# Patient Record
Sex: Male | Born: 1950
Health system: Southern US, Community
[De-identification: ages and names within clinical notes are randomized; demographics above are authoritative.]

## PROBLEM LIST (undated history)

## (undated) DIAGNOSIS — C760 Malignant neoplasm of head, face and neck: Secondary | ICD-10-CM

## (undated) DIAGNOSIS — D693 Immune thrombocytopenic purpura: Secondary | ICD-10-CM

## (undated) HISTORY — PX: TRACHEOSTOMY: SUR1362

## (undated) MED FILL — Romiplostim For Inj 250 MCG: SUBCUTANEOUS | Qty: 0.41 | Status: AC

## (undated) MED FILL — Romiplostim For Inj 125 MCG: SUBCUTANEOUS | Qty: 0.75 | Status: AC

## (undated) MED FILL — Romiplostim For Inj 500 MCG: SUBCUTANEOUS | Qty: 0.86 | Status: AC

## (undated) MED FILL — Rituximab-pvvr IV Soln 500 MG/50ML (10 MG/ML): INTRAVENOUS | Qty: 80 | Status: AC

## (undated) MED FILL — Acetaminophen Soln 160 MG/5ML: ORAL | Qty: 20.3 | Status: AC

## (undated) MED FILL — Romiplostim For Inj 500 MCG: SUBCUTANEOUS | Qty: 0.83 | Status: AC

---

## 2014-02-02 DIAGNOSIS — Z23 Encounter for immunization: Secondary | ICD-10-CM | POA: Diagnosis not present

## 2014-02-23 DIAGNOSIS — I10 Essential (primary) hypertension: Secondary | ICD-10-CM | POA: Diagnosis not present

## 2014-02-23 DIAGNOSIS — D539 Nutritional anemia, unspecified: Secondary | ICD-10-CM | POA: Diagnosis not present

## 2014-02-23 DIAGNOSIS — G4733 Obstructive sleep apnea (adult) (pediatric): Secondary | ICD-10-CM | POA: Diagnosis not present

## 2014-02-23 DIAGNOSIS — E780 Pure hypercholesterolemia: Secondary | ICD-10-CM | POA: Diagnosis not present

## 2014-02-23 DIAGNOSIS — E871 Hypo-osmolality and hyponatremia: Secondary | ICD-10-CM | POA: Diagnosis not present

## 2014-02-23 DIAGNOSIS — Z125 Encounter for screening for malignant neoplasm of prostate: Secondary | ICD-10-CM | POA: Diagnosis not present

## 2014-03-05 DIAGNOSIS — D696 Thrombocytopenia, unspecified: Secondary | ICD-10-CM | POA: Diagnosis not present

## 2014-03-12 DIAGNOSIS — D696 Thrombocytopenia, unspecified: Secondary | ICD-10-CM | POA: Diagnosis not present

## 2014-03-28 DIAGNOSIS — D696 Thrombocytopenia, unspecified: Secondary | ICD-10-CM | POA: Diagnosis not present

## 2014-03-28 DIAGNOSIS — D649 Anemia, unspecified: Secondary | ICD-10-CM | POA: Diagnosis not present

## 2014-04-27 DIAGNOSIS — R109 Unspecified abdominal pain: Secondary | ICD-10-CM | POA: Diagnosis not present

## 2014-04-27 DIAGNOSIS — K760 Fatty (change of) liver, not elsewhere classified: Secondary | ICD-10-CM | POA: Diagnosis not present

## 2014-04-27 DIAGNOSIS — K7460 Unspecified cirrhosis of liver: Secondary | ICD-10-CM | POA: Diagnosis not present

## 2014-04-27 DIAGNOSIS — D649 Anemia, unspecified: Secondary | ICD-10-CM | POA: Diagnosis not present

## 2014-04-30 DIAGNOSIS — D649 Anemia, unspecified: Secondary | ICD-10-CM | POA: Diagnosis not present

## 2014-04-30 DIAGNOSIS — D696 Thrombocytopenia, unspecified: Secondary | ICD-10-CM | POA: Diagnosis not present

## 2014-04-30 DIAGNOSIS — Z510 Encounter for antineoplastic radiation therapy: Secondary | ICD-10-CM | POA: Diagnosis not present

## 2014-08-30 DIAGNOSIS — D649 Anemia, unspecified: Secondary | ICD-10-CM | POA: Diagnosis not present

## 2014-08-30 DIAGNOSIS — D696 Thrombocytopenia, unspecified: Secondary | ICD-10-CM | POA: Diagnosis not present

## 2014-08-31 ENCOUNTER — Other Ambulatory Visit (HOSPITAL_COMMUNITY)
Admission: RE | Admit: 2014-08-31 | Disposition: A | Discharge status: Home / Self Care | Payer: Self-pay | Source: Ambulatory Visit | Attending: Oncology | Admitting: Oncology

## 2014-08-31 DIAGNOSIS — D469 Myelodysplastic syndrome, unspecified: Secondary | ICD-10-CM | POA: Diagnosis not present

## 2014-08-31 DIAGNOSIS — D696 Thrombocytopenia, unspecified: Secondary | ICD-10-CM | POA: Diagnosis not present

## 2014-08-31 DIAGNOSIS — D61818 Other pancytopenia: Secondary | ICD-10-CM | POA: Diagnosis not present

## 2014-08-31 LAB — BONE MARROW EXAM

## 2014-09-03 DIAGNOSIS — D649 Anemia, unspecified: Secondary | ICD-10-CM | POA: Diagnosis not present

## 2014-09-03 DIAGNOSIS — D696 Thrombocytopenia, unspecified: Secondary | ICD-10-CM | POA: Diagnosis not present

## 2014-09-07 DIAGNOSIS — D696 Thrombocytopenia, unspecified: Secondary | ICD-10-CM | POA: Diagnosis not present

## 2014-09-07 DIAGNOSIS — D72819 Decreased white blood cell count, unspecified: Secondary | ICD-10-CM | POA: Diagnosis not present

## 2014-09-10 DIAGNOSIS — Z5111 Encounter for antineoplastic chemotherapy: Secondary | ICD-10-CM | POA: Diagnosis not present

## 2014-09-10 DIAGNOSIS — D649 Anemia, unspecified: Secondary | ICD-10-CM | POA: Diagnosis not present

## 2014-09-10 DIAGNOSIS — D693 Immune thrombocytopenic purpura: Secondary | ICD-10-CM | POA: Diagnosis not present

## 2014-09-10 LAB — CHROMOSOME ANALYSIS, BONE MARROW

## 2014-09-11 DIAGNOSIS — G4730 Sleep apnea, unspecified: Secondary | ICD-10-CM | POA: Diagnosis not present

## 2014-09-11 DIAGNOSIS — Z5111 Encounter for antineoplastic chemotherapy: Secondary | ICD-10-CM | POA: Diagnosis not present

## 2014-09-11 DIAGNOSIS — D649 Anemia, unspecified: Secondary | ICD-10-CM | POA: Diagnosis not present

## 2014-09-11 DIAGNOSIS — D693 Immune thrombocytopenic purpura: Secondary | ICD-10-CM | POA: Diagnosis not present

## 2014-09-11 DIAGNOSIS — Z6841 Body Mass Index (BMI) 40.0 and over, adult: Secondary | ICD-10-CM | POA: Diagnosis not present

## 2014-09-13 DIAGNOSIS — Z23 Encounter for immunization: Secondary | ICD-10-CM | POA: Diagnosis not present

## 2014-09-13 DIAGNOSIS — D693 Immune thrombocytopenic purpura: Secondary | ICD-10-CM | POA: Diagnosis not present

## 2014-09-20 DIAGNOSIS — D693 Immune thrombocytopenic purpura: Secondary | ICD-10-CM | POA: Diagnosis not present

## 2014-09-25 ENCOUNTER — Encounter (HOSPITAL_COMMUNITY): Payer: Self-pay

## 2014-09-26 DIAGNOSIS — Z79899 Other long term (current) drug therapy: Secondary | ICD-10-CM | POA: Diagnosis not present

## 2014-09-26 DIAGNOSIS — D693 Immune thrombocytopenic purpura: Secondary | ICD-10-CM | POA: Diagnosis not present

## 2014-09-26 DIAGNOSIS — E780 Pure hypercholesterolemia: Secondary | ICD-10-CM | POA: Diagnosis not present

## 2014-09-26 DIAGNOSIS — G4733 Obstructive sleep apnea (adult) (pediatric): Secondary | ICD-10-CM | POA: Diagnosis not present

## 2014-09-26 DIAGNOSIS — Z87891 Personal history of nicotine dependence: Secondary | ICD-10-CM | POA: Diagnosis not present

## 2014-09-26 DIAGNOSIS — Z6841 Body Mass Index (BMI) 40.0 and over, adult: Secondary | ICD-10-CM | POA: Diagnosis not present

## 2014-09-26 DIAGNOSIS — I10 Essential (primary) hypertension: Secondary | ICD-10-CM | POA: Diagnosis not present

## 2014-09-26 DIAGNOSIS — Z9981 Dependence on supplemental oxygen: Secondary | ICD-10-CM | POA: Diagnosis not present

## 2014-09-26 DIAGNOSIS — G4730 Sleep apnea, unspecified: Secondary | ICD-10-CM | POA: Diagnosis not present

## 2014-09-26 DIAGNOSIS — I517 Cardiomegaly: Secondary | ICD-10-CM | POA: Diagnosis not present

## 2014-09-28 ENCOUNTER — Other Ambulatory Visit: Payer: Self-pay

## 2014-09-28 DIAGNOSIS — I10 Essential (primary) hypertension: Secondary | ICD-10-CM | POA: Diagnosis not present

## 2014-09-28 DIAGNOSIS — D693 Immune thrombocytopenic purpura: Secondary | ICD-10-CM | POA: Diagnosis not present

## 2014-09-28 DIAGNOSIS — E780 Pure hypercholesterolemia: Secondary | ICD-10-CM | POA: Diagnosis not present

## 2014-09-28 DIAGNOSIS — D732 Chronic congestive splenomegaly: Secondary | ICD-10-CM | POA: Diagnosis not present

## 2014-09-28 DIAGNOSIS — G4730 Sleep apnea, unspecified: Secondary | ICD-10-CM | POA: Diagnosis not present

## 2014-09-28 DIAGNOSIS — Z6841 Body Mass Index (BMI) 40.0 and over, adult: Secondary | ICD-10-CM | POA: Diagnosis not present

## 2014-09-29 DIAGNOSIS — Z6841 Body Mass Index (BMI) 40.0 and over, adult: Secondary | ICD-10-CM | POA: Diagnosis not present

## 2014-09-29 DIAGNOSIS — D693 Immune thrombocytopenic purpura: Secondary | ICD-10-CM | POA: Diagnosis not present

## 2014-09-29 DIAGNOSIS — I10 Essential (primary) hypertension: Secondary | ICD-10-CM | POA: Diagnosis not present

## 2014-09-29 DIAGNOSIS — E780 Pure hypercholesterolemia: Secondary | ICD-10-CM | POA: Diagnosis not present

## 2014-09-29 DIAGNOSIS — G4730 Sleep apnea, unspecified: Secondary | ICD-10-CM | POA: Diagnosis not present

## 2014-10-12 DIAGNOSIS — D693 Immune thrombocytopenic purpura: Secondary | ICD-10-CM | POA: Diagnosis not present

## 2014-10-12 DIAGNOSIS — D649 Anemia, unspecified: Secondary | ICD-10-CM | POA: Diagnosis not present

## 2014-11-02 DIAGNOSIS — D693 Immune thrombocytopenic purpura: Secondary | ICD-10-CM | POA: Diagnosis not present

## 2014-11-02 DIAGNOSIS — D649 Anemia, unspecified: Secondary | ICD-10-CM | POA: Diagnosis not present

## 2014-11-22 DIAGNOSIS — D696 Thrombocytopenia, unspecified: Secondary | ICD-10-CM | POA: Diagnosis not present

## 2015-01-04 DIAGNOSIS — D693 Immune thrombocytopenic purpura: Secondary | ICD-10-CM | POA: Diagnosis not present

## 2015-01-04 DIAGNOSIS — Z0001 Encounter for general adult medical examination with abnormal findings: Secondary | ICD-10-CM | POA: Diagnosis not present

## 2015-01-04 DIAGNOSIS — D649 Anemia, unspecified: Secondary | ICD-10-CM | POA: Diagnosis not present

## 2015-02-25 DIAGNOSIS — E7800 Pure hypercholesterolemia, unspecified: Secondary | ICD-10-CM | POA: Diagnosis not present

## 2015-02-25 DIAGNOSIS — Z0000 Encounter for general adult medical examination without abnormal findings: Secondary | ICD-10-CM | POA: Diagnosis not present

## 2015-02-25 DIAGNOSIS — Z79899 Other long term (current) drug therapy: Secondary | ICD-10-CM | POA: Diagnosis not present

## 2015-02-25 DIAGNOSIS — D693 Immune thrombocytopenic purpura: Secondary | ICD-10-CM | POA: Diagnosis not present

## 2015-02-25 DIAGNOSIS — Z125 Encounter for screening for malignant neoplasm of prostate: Secondary | ICD-10-CM | POA: Diagnosis not present

## 2015-02-25 DIAGNOSIS — G4733 Obstructive sleep apnea (adult) (pediatric): Secondary | ICD-10-CM | POA: Diagnosis not present

## 2015-02-25 DIAGNOSIS — R7309 Other abnormal glucose: Secondary | ICD-10-CM | POA: Diagnosis not present

## 2015-02-25 DIAGNOSIS — I10 Essential (primary) hypertension: Secondary | ICD-10-CM | POA: Diagnosis not present

## 2015-05-06 DIAGNOSIS — D693 Immune thrombocytopenic purpura: Secondary | ICD-10-CM | POA: Diagnosis not present

## 2015-06-07 DIAGNOSIS — J039 Acute tonsillitis, unspecified: Secondary | ICD-10-CM | POA: Diagnosis not present

## 2015-06-10 ENCOUNTER — Other Ambulatory Visit: Payer: Self-pay

## 2015-06-10 DIAGNOSIS — Z789 Other specified health status: Secondary | ICD-10-CM | POA: Diagnosis not present

## 2015-06-10 DIAGNOSIS — C099 Malignant neoplasm of tonsil, unspecified: Secondary | ICD-10-CM | POA: Diagnosis not present

## 2015-06-10 DIAGNOSIS — J358 Other chronic diseases of tonsils and adenoids: Secondary | ICD-10-CM | POA: Diagnosis not present

## 2015-06-10 DIAGNOSIS — R221 Localized swelling, mass and lump, neck: Secondary | ICD-10-CM | POA: Diagnosis not present

## 2015-06-13 DIAGNOSIS — J358 Other chronic diseases of tonsils and adenoids: Secondary | ICD-10-CM | POA: Diagnosis not present

## 2015-06-13 DIAGNOSIS — R221 Localized swelling, mass and lump, neck: Secondary | ICD-10-CM | POA: Diagnosis not present

## 2015-06-17 DIAGNOSIS — Z79899 Other long term (current) drug therapy: Secondary | ICD-10-CM | POA: Diagnosis not present

## 2015-06-17 DIAGNOSIS — D693 Immune thrombocytopenic purpura: Secondary | ICD-10-CM | POA: Diagnosis not present

## 2015-06-17 DIAGNOSIS — F329 Major depressive disorder, single episode, unspecified: Secondary | ICD-10-CM | POA: Diagnosis not present

## 2015-06-17 DIAGNOSIS — C090 Malignant neoplasm of tonsillar fossa: Secondary | ICD-10-CM | POA: Diagnosis not present

## 2015-06-17 DIAGNOSIS — E7800 Pure hypercholesterolemia, unspecified: Secondary | ICD-10-CM | POA: Diagnosis not present

## 2015-06-17 DIAGNOSIS — Z87891 Personal history of nicotine dependence: Secondary | ICD-10-CM | POA: Diagnosis not present

## 2015-06-17 DIAGNOSIS — I10 Essential (primary) hypertension: Secondary | ICD-10-CM | POA: Diagnosis not present

## 2015-06-17 DIAGNOSIS — B977 Papillomavirus as the cause of diseases classified elsewhere: Secondary | ICD-10-CM | POA: Diagnosis not present

## 2015-06-19 DIAGNOSIS — C090 Malignant neoplasm of tonsillar fossa: Secondary | ICD-10-CM | POA: Diagnosis not present

## 2015-06-19 DIAGNOSIS — D696 Thrombocytopenia, unspecified: Secondary | ICD-10-CM | POA: Diagnosis not present

## 2015-06-19 DIAGNOSIS — D649 Anemia, unspecified: Secondary | ICD-10-CM | POA: Diagnosis not present

## 2015-06-19 DIAGNOSIS — C760 Malignant neoplasm of head, face and neck: Secondary | ICD-10-CM | POA: Diagnosis not present

## 2015-06-20 DIAGNOSIS — C090 Malignant neoplasm of tonsillar fossa: Secondary | ICD-10-CM | POA: Diagnosis not present

## 2015-06-20 DIAGNOSIS — C099 Malignant neoplasm of tonsil, unspecified: Secondary | ICD-10-CM | POA: Diagnosis not present

## 2015-06-24 ENCOUNTER — Other Ambulatory Visit: Payer: Self-pay

## 2015-06-24 DIAGNOSIS — R590 Localized enlarged lymph nodes: Secondary | ICD-10-CM | POA: Diagnosis not present

## 2015-06-24 DIAGNOSIS — C090 Malignant neoplasm of tonsillar fossa: Secondary | ICD-10-CM | POA: Diagnosis not present

## 2015-06-24 DIAGNOSIS — R599 Enlarged lymph nodes, unspecified: Secondary | ICD-10-CM | POA: Diagnosis not present

## 2015-06-28 DIAGNOSIS — C090 Malignant neoplasm of tonsillar fossa: Secondary | ICD-10-CM | POA: Diagnosis not present

## 2015-06-28 DIAGNOSIS — C778 Secondary and unspecified malignant neoplasm of lymph nodes of multiple regions: Secondary | ICD-10-CM | POA: Diagnosis not present

## 2015-06-28 DIAGNOSIS — Z87891 Personal history of nicotine dependence: Secondary | ICD-10-CM | POA: Diagnosis not present

## 2015-07-01 DIAGNOSIS — C099 Malignant neoplasm of tonsil, unspecified: Secondary | ICD-10-CM | POA: Diagnosis not present

## 2015-07-03 DIAGNOSIS — C099 Malignant neoplasm of tonsil, unspecified: Secondary | ICD-10-CM | POA: Diagnosis not present

## 2015-07-03 DIAGNOSIS — Z452 Encounter for adjustment and management of vascular access device: Secondary | ICD-10-CM | POA: Diagnosis not present

## 2015-07-03 DIAGNOSIS — R1310 Dysphagia, unspecified: Secondary | ICD-10-CM | POA: Diagnosis not present

## 2015-07-03 DIAGNOSIS — R633 Feeding difficulties: Secondary | ICD-10-CM | POA: Diagnosis not present

## 2015-07-05 DIAGNOSIS — Z87891 Personal history of nicotine dependence: Secondary | ICD-10-CM | POA: Diagnosis not present

## 2015-07-05 DIAGNOSIS — K9429 Other complications of gastrostomy: Secondary | ICD-10-CM | POA: Diagnosis not present

## 2015-07-05 DIAGNOSIS — I10 Essential (primary) hypertension: Secondary | ICD-10-CM | POA: Diagnosis not present

## 2015-07-05 DIAGNOSIS — C099 Malignant neoplasm of tonsil, unspecified: Secondary | ICD-10-CM | POA: Diagnosis not present

## 2015-07-05 DIAGNOSIS — Z09 Encounter for follow-up examination after completed treatment for conditions other than malignant neoplasm: Secondary | ICD-10-CM | POA: Diagnosis not present

## 2015-07-05 DIAGNOSIS — F329 Major depressive disorder, single episode, unspecified: Secondary | ICD-10-CM | POA: Diagnosis not present

## 2015-07-05 DIAGNOSIS — Z79899 Other long term (current) drug therapy: Secondary | ICD-10-CM | POA: Diagnosis not present

## 2015-07-07 DIAGNOSIS — R6521 Severe sepsis with septic shock: Secondary | ICD-10-CM | POA: Diagnosis not present

## 2015-07-07 DIAGNOSIS — A419 Sepsis, unspecified organism: Secondary | ICD-10-CM | POA: Diagnosis not present

## 2015-07-07 DIAGNOSIS — R1084 Generalized abdominal pain: Secondary | ICD-10-CM | POA: Diagnosis not present

## 2015-07-07 DIAGNOSIS — K9423 Gastrostomy malfunction: Secondary | ICD-10-CM | POA: Diagnosis not present

## 2015-07-07 DIAGNOSIS — R0602 Shortness of breath: Secondary | ICD-10-CM | POA: Diagnosis not present

## 2015-07-07 DIAGNOSIS — M799 Soft tissue disorder, unspecified: Secondary | ICD-10-CM | POA: Diagnosis not present

## 2015-07-08 ENCOUNTER — Other Ambulatory Visit: Payer: Self-pay

## 2015-07-08 DIAGNOSIS — Z79899 Other long term (current) drug therapy: Secondary | ICD-10-CM | POA: Diagnosis not present

## 2015-07-08 DIAGNOSIS — K9429 Other complications of gastrostomy: Secondary | ICD-10-CM | POA: Diagnosis not present

## 2015-07-08 DIAGNOSIS — Z09 Encounter for follow-up examination after completed treatment for conditions other than malignant neoplasm: Secondary | ICD-10-CM | POA: Diagnosis not present

## 2015-07-08 DIAGNOSIS — I10 Essential (primary) hypertension: Secondary | ICD-10-CM | POA: Diagnosis present

## 2015-07-08 DIAGNOSIS — R6521 Severe sepsis with septic shock: Secondary | ICD-10-CM | POA: Diagnosis not present

## 2015-07-08 DIAGNOSIS — F329 Major depressive disorder, single episode, unspecified: Secondary | ICD-10-CM | POA: Diagnosis present

## 2015-07-08 DIAGNOSIS — R9431 Abnormal electrocardiogram [ECG] [EKG]: Secondary | ICD-10-CM | POA: Diagnosis not present

## 2015-07-08 DIAGNOSIS — M799 Soft tissue disorder, unspecified: Secondary | ICD-10-CM | POA: Diagnosis not present

## 2015-07-08 DIAGNOSIS — R109 Unspecified abdominal pain: Secondary | ICD-10-CM | POA: Diagnosis not present

## 2015-07-08 DIAGNOSIS — R0602 Shortness of breath: Secondary | ICD-10-CM | POA: Diagnosis not present

## 2015-07-08 DIAGNOSIS — K2900 Acute gastritis without bleeding: Secondary | ICD-10-CM | POA: Diagnosis not present

## 2015-07-08 DIAGNOSIS — C090 Malignant neoplasm of tonsillar fossa: Secondary | ICD-10-CM | POA: Diagnosis not present

## 2015-07-08 DIAGNOSIS — Z87891 Personal history of nicotine dependence: Secondary | ICD-10-CM | POA: Diagnosis not present

## 2015-07-08 DIAGNOSIS — K9420 Gastrostomy complication, unspecified: Secondary | ICD-10-CM | POA: Diagnosis not present

## 2015-07-08 DIAGNOSIS — A419 Sepsis, unspecified organism: Secondary | ICD-10-CM | POA: Diagnosis not present

## 2015-07-08 DIAGNOSIS — R1084 Generalized abdominal pain: Secondary | ICD-10-CM | POA: Diagnosis not present

## 2015-07-08 DIAGNOSIS — K9423 Gastrostomy malfunction: Secondary | ICD-10-CM | POA: Diagnosis not present

## 2015-07-08 DIAGNOSIS — C099 Malignant neoplasm of tonsil, unspecified: Secondary | ICD-10-CM | POA: Diagnosis present

## 2015-07-10 DIAGNOSIS — C090 Malignant neoplasm of tonsillar fossa: Secondary | ICD-10-CM | POA: Diagnosis not present

## 2015-07-15 DIAGNOSIS — C090 Malignant neoplasm of tonsillar fossa: Secondary | ICD-10-CM | POA: Diagnosis not present

## 2015-07-15 DIAGNOSIS — Z510 Encounter for antineoplastic radiation therapy: Secondary | ICD-10-CM | POA: Diagnosis not present

## 2015-07-16 DIAGNOSIS — Z959 Presence of cardiac and vascular implant and graft, unspecified: Secondary | ICD-10-CM | POA: Diagnosis not present

## 2015-07-16 DIAGNOSIS — D649 Anemia, unspecified: Secondary | ICD-10-CM | POA: Diagnosis not present

## 2015-07-16 DIAGNOSIS — E785 Hyperlipidemia, unspecified: Secondary | ICD-10-CM | POA: Diagnosis not present

## 2015-07-16 DIAGNOSIS — G4730 Sleep apnea, unspecified: Secondary | ICD-10-CM | POA: Diagnosis not present

## 2015-07-16 DIAGNOSIS — C099 Malignant neoplasm of tonsil, unspecified: Secondary | ICD-10-CM | POA: Diagnosis not present

## 2015-07-16 DIAGNOSIS — Z48815 Encounter for surgical aftercare following surgery on the digestive system: Secondary | ICD-10-CM | POA: Diagnosis not present

## 2015-07-16 DIAGNOSIS — F339 Major depressive disorder, recurrent, unspecified: Secondary | ICD-10-CM | POA: Diagnosis not present

## 2015-07-16 DIAGNOSIS — I10 Essential (primary) hypertension: Secondary | ICD-10-CM | POA: Diagnosis not present

## 2015-07-16 DIAGNOSIS — S31109A Unspecified open wound of abdominal wall, unspecified quadrant without penetration into peritoneal cavity, initial encounter: Secondary | ICD-10-CM | POA: Diagnosis not present

## 2015-07-16 DIAGNOSIS — K5730 Diverticulosis of large intestine without perforation or abscess without bleeding: Secondary | ICD-10-CM | POA: Diagnosis not present

## 2015-07-16 DIAGNOSIS — S31101A Unspecified open wound of abdominal wall, left upper quadrant without penetration into peritoneal cavity, initial encounter: Secondary | ICD-10-CM | POA: Diagnosis not present

## 2015-07-16 DIAGNOSIS — T8131XA Disruption of external operation (surgical) wound, not elsewhere classified, initial encounter: Secondary | ICD-10-CM | POA: Diagnosis not present

## 2015-07-16 DIAGNOSIS — K9420 Gastrostomy complication, unspecified: Secondary | ICD-10-CM | POA: Diagnosis not present

## 2015-07-16 DIAGNOSIS — R197 Diarrhea, unspecified: Secondary | ICD-10-CM | POA: Diagnosis not present

## 2015-07-16 DIAGNOSIS — C090 Malignant neoplasm of tonsillar fossa: Secondary | ICD-10-CM | POA: Diagnosis not present

## 2015-07-16 DIAGNOSIS — Z923 Personal history of irradiation: Secondary | ICD-10-CM | POA: Diagnosis not present

## 2015-07-16 DIAGNOSIS — Z931 Gastrostomy status: Secondary | ICD-10-CM | POA: Diagnosis not present

## 2015-07-16 DIAGNOSIS — Z87891 Personal history of nicotine dependence: Secondary | ICD-10-CM | POA: Diagnosis not present

## 2015-07-17 DIAGNOSIS — I10 Essential (primary) hypertension: Secondary | ICD-10-CM | POA: Diagnosis not present

## 2015-07-17 DIAGNOSIS — R197 Diarrhea, unspecified: Secondary | ICD-10-CM | POA: Diagnosis not present

## 2015-07-17 DIAGNOSIS — Z931 Gastrostomy status: Secondary | ICD-10-CM | POA: Diagnosis not present

## 2015-07-17 DIAGNOSIS — Z510 Encounter for antineoplastic radiation therapy: Secondary | ICD-10-CM | POA: Diagnosis not present

## 2015-07-17 DIAGNOSIS — Z959 Presence of cardiac and vascular implant and graft, unspecified: Secondary | ICD-10-CM | POA: Diagnosis not present

## 2015-07-17 DIAGNOSIS — C099 Malignant neoplasm of tonsil, unspecified: Secondary | ICD-10-CM | POA: Diagnosis not present

## 2015-07-17 DIAGNOSIS — C090 Malignant neoplasm of tonsillar fossa: Secondary | ICD-10-CM | POA: Diagnosis not present

## 2015-07-17 DIAGNOSIS — K9420 Gastrostomy complication, unspecified: Secondary | ICD-10-CM | POA: Diagnosis not present

## 2015-07-18 DIAGNOSIS — C090 Malignant neoplasm of tonsillar fossa: Secondary | ICD-10-CM | POA: Diagnosis not present

## 2015-07-18 DIAGNOSIS — Z510 Encounter for antineoplastic radiation therapy: Secondary | ICD-10-CM | POA: Diagnosis not present

## 2015-07-19 DIAGNOSIS — K9420 Gastrostomy complication, unspecified: Secondary | ICD-10-CM | POA: Diagnosis not present

## 2015-07-19 DIAGNOSIS — C090 Malignant neoplasm of tonsillar fossa: Secondary | ICD-10-CM | POA: Diagnosis not present

## 2015-07-19 DIAGNOSIS — I10 Essential (primary) hypertension: Secondary | ICD-10-CM | POA: Diagnosis not present

## 2015-07-19 DIAGNOSIS — C099 Malignant neoplasm of tonsil, unspecified: Secondary | ICD-10-CM | POA: Diagnosis not present

## 2015-07-19 DIAGNOSIS — Z931 Gastrostomy status: Secondary | ICD-10-CM | POA: Diagnosis not present

## 2015-07-19 DIAGNOSIS — Z959 Presence of cardiac and vascular implant and graft, unspecified: Secondary | ICD-10-CM | POA: Diagnosis not present

## 2015-07-19 DIAGNOSIS — R197 Diarrhea, unspecified: Secondary | ICD-10-CM | POA: Diagnosis not present

## 2015-07-19 DIAGNOSIS — Z510 Encounter for antineoplastic radiation therapy: Secondary | ICD-10-CM | POA: Diagnosis not present

## 2015-07-20 DIAGNOSIS — I10 Essential (primary) hypertension: Secondary | ICD-10-CM | POA: Diagnosis not present

## 2015-07-20 DIAGNOSIS — Z959 Presence of cardiac and vascular implant and graft, unspecified: Secondary | ICD-10-CM | POA: Diagnosis not present

## 2015-07-20 DIAGNOSIS — K9420 Gastrostomy complication, unspecified: Secondary | ICD-10-CM | POA: Diagnosis not present

## 2015-07-20 DIAGNOSIS — C099 Malignant neoplasm of tonsil, unspecified: Secondary | ICD-10-CM | POA: Diagnosis not present

## 2015-07-20 DIAGNOSIS — R197 Diarrhea, unspecified: Secondary | ICD-10-CM | POA: Diagnosis not present

## 2015-07-20 DIAGNOSIS — Z931 Gastrostomy status: Secondary | ICD-10-CM | POA: Diagnosis not present

## 2015-07-21 DIAGNOSIS — K9420 Gastrostomy complication, unspecified: Secondary | ICD-10-CM | POA: Diagnosis not present

## 2015-07-21 DIAGNOSIS — Z959 Presence of cardiac and vascular implant and graft, unspecified: Secondary | ICD-10-CM | POA: Diagnosis not present

## 2015-07-21 DIAGNOSIS — Z931 Gastrostomy status: Secondary | ICD-10-CM | POA: Diagnosis not present

## 2015-07-21 DIAGNOSIS — I10 Essential (primary) hypertension: Secondary | ICD-10-CM | POA: Diagnosis not present

## 2015-07-21 DIAGNOSIS — R197 Diarrhea, unspecified: Secondary | ICD-10-CM | POA: Diagnosis not present

## 2015-07-21 DIAGNOSIS — C099 Malignant neoplasm of tonsil, unspecified: Secondary | ICD-10-CM | POA: Diagnosis not present

## 2015-07-22 DIAGNOSIS — Z959 Presence of cardiac and vascular implant and graft, unspecified: Secondary | ICD-10-CM | POA: Diagnosis not present

## 2015-07-22 DIAGNOSIS — C099 Malignant neoplasm of tonsil, unspecified: Secondary | ICD-10-CM | POA: Diagnosis not present

## 2015-07-22 DIAGNOSIS — C090 Malignant neoplasm of tonsillar fossa: Secondary | ICD-10-CM | POA: Diagnosis not present

## 2015-07-22 DIAGNOSIS — I10 Essential (primary) hypertension: Secondary | ICD-10-CM | POA: Diagnosis not present

## 2015-07-22 DIAGNOSIS — Z510 Encounter for antineoplastic radiation therapy: Secondary | ICD-10-CM | POA: Diagnosis not present

## 2015-07-22 DIAGNOSIS — K9420 Gastrostomy complication, unspecified: Secondary | ICD-10-CM | POA: Diagnosis not present

## 2015-07-22 DIAGNOSIS — R197 Diarrhea, unspecified: Secondary | ICD-10-CM | POA: Diagnosis not present

## 2015-07-22 DIAGNOSIS — Z931 Gastrostomy status: Secondary | ICD-10-CM | POA: Diagnosis not present

## 2015-07-23 DIAGNOSIS — Z931 Gastrostomy status: Secondary | ICD-10-CM | POA: Diagnosis not present

## 2015-07-23 DIAGNOSIS — Z510 Encounter for antineoplastic radiation therapy: Secondary | ICD-10-CM | POA: Diagnosis not present

## 2015-07-23 DIAGNOSIS — C090 Malignant neoplasm of tonsillar fossa: Secondary | ICD-10-CM | POA: Diagnosis not present

## 2015-07-23 DIAGNOSIS — Z959 Presence of cardiac and vascular implant and graft, unspecified: Secondary | ICD-10-CM | POA: Diagnosis not present

## 2015-07-23 DIAGNOSIS — R197 Diarrhea, unspecified: Secondary | ICD-10-CM | POA: Diagnosis not present

## 2015-07-23 DIAGNOSIS — C099 Malignant neoplasm of tonsil, unspecified: Secondary | ICD-10-CM | POA: Diagnosis not present

## 2015-07-23 DIAGNOSIS — I10 Essential (primary) hypertension: Secondary | ICD-10-CM | POA: Diagnosis not present

## 2015-07-23 DIAGNOSIS — K9420 Gastrostomy complication, unspecified: Secondary | ICD-10-CM | POA: Diagnosis not present

## 2015-07-24 DIAGNOSIS — C099 Malignant neoplasm of tonsil, unspecified: Secondary | ICD-10-CM | POA: Diagnosis not present

## 2015-07-24 DIAGNOSIS — Z931 Gastrostomy status: Secondary | ICD-10-CM | POA: Diagnosis not present

## 2015-07-24 DIAGNOSIS — K9420 Gastrostomy complication, unspecified: Secondary | ICD-10-CM | POA: Diagnosis not present

## 2015-07-24 DIAGNOSIS — I10 Essential (primary) hypertension: Secondary | ICD-10-CM | POA: Diagnosis not present

## 2015-07-24 DIAGNOSIS — R197 Diarrhea, unspecified: Secondary | ICD-10-CM | POA: Diagnosis not present

## 2015-07-24 DIAGNOSIS — C090 Malignant neoplasm of tonsillar fossa: Secondary | ICD-10-CM | POA: Diagnosis not present

## 2015-07-24 DIAGNOSIS — Z510 Encounter for antineoplastic radiation therapy: Secondary | ICD-10-CM | POA: Diagnosis not present

## 2015-07-24 DIAGNOSIS — Z959 Presence of cardiac and vascular implant and graft, unspecified: Secondary | ICD-10-CM | POA: Diagnosis not present

## 2015-07-25 DIAGNOSIS — C090 Malignant neoplasm of tonsillar fossa: Secondary | ICD-10-CM | POA: Diagnosis not present

## 2015-07-25 DIAGNOSIS — I10 Essential (primary) hypertension: Secondary | ICD-10-CM | POA: Diagnosis not present

## 2015-07-25 DIAGNOSIS — Z931 Gastrostomy status: Secondary | ICD-10-CM | POA: Diagnosis not present

## 2015-07-25 DIAGNOSIS — R197 Diarrhea, unspecified: Secondary | ICD-10-CM | POA: Diagnosis not present

## 2015-07-25 DIAGNOSIS — K9420 Gastrostomy complication, unspecified: Secondary | ICD-10-CM | POA: Diagnosis not present

## 2015-07-25 DIAGNOSIS — Z959 Presence of cardiac and vascular implant and graft, unspecified: Secondary | ICD-10-CM | POA: Diagnosis not present

## 2015-07-25 DIAGNOSIS — C099 Malignant neoplasm of tonsil, unspecified: Secondary | ICD-10-CM | POA: Diagnosis not present

## 2015-07-25 DIAGNOSIS — Z510 Encounter for antineoplastic radiation therapy: Secondary | ICD-10-CM | POA: Diagnosis not present

## 2015-07-26 DIAGNOSIS — Z510 Encounter for antineoplastic radiation therapy: Secondary | ICD-10-CM | POA: Diagnosis not present

## 2015-07-26 DIAGNOSIS — R197 Diarrhea, unspecified: Secondary | ICD-10-CM | POA: Diagnosis not present

## 2015-07-26 DIAGNOSIS — Z959 Presence of cardiac and vascular implant and graft, unspecified: Secondary | ICD-10-CM | POA: Diagnosis not present

## 2015-07-26 DIAGNOSIS — C090 Malignant neoplasm of tonsillar fossa: Secondary | ICD-10-CM | POA: Diagnosis not present

## 2015-07-26 DIAGNOSIS — I10 Essential (primary) hypertension: Secondary | ICD-10-CM | POA: Diagnosis not present

## 2015-07-26 DIAGNOSIS — K9420 Gastrostomy complication, unspecified: Secondary | ICD-10-CM | POA: Diagnosis not present

## 2015-07-26 DIAGNOSIS — C099 Malignant neoplasm of tonsil, unspecified: Secondary | ICD-10-CM | POA: Diagnosis not present

## 2015-07-26 DIAGNOSIS — Z931 Gastrostomy status: Secondary | ICD-10-CM | POA: Diagnosis not present

## 2015-07-29 DIAGNOSIS — Z510 Encounter for antineoplastic radiation therapy: Secondary | ICD-10-CM | POA: Diagnosis not present

## 2015-07-29 DIAGNOSIS — C090 Malignant neoplasm of tonsillar fossa: Secondary | ICD-10-CM | POA: Diagnosis not present

## 2015-07-30 DIAGNOSIS — C090 Malignant neoplasm of tonsillar fossa: Secondary | ICD-10-CM | POA: Diagnosis not present

## 2015-07-30 DIAGNOSIS — Z510 Encounter for antineoplastic radiation therapy: Secondary | ICD-10-CM | POA: Diagnosis not present

## 2015-07-31 DIAGNOSIS — I10 Essential (primary) hypertension: Secondary | ICD-10-CM | POA: Diagnosis not present

## 2015-07-31 DIAGNOSIS — C090 Malignant neoplasm of tonsillar fossa: Secondary | ICD-10-CM | POA: Diagnosis not present

## 2015-07-31 DIAGNOSIS — Z931 Gastrostomy status: Secondary | ICD-10-CM | POA: Diagnosis not present

## 2015-07-31 DIAGNOSIS — K9420 Gastrostomy complication, unspecified: Secondary | ICD-10-CM | POA: Diagnosis not present

## 2015-07-31 DIAGNOSIS — Z510 Encounter for antineoplastic radiation therapy: Secondary | ICD-10-CM | POA: Diagnosis not present

## 2015-07-31 DIAGNOSIS — R197 Diarrhea, unspecified: Secondary | ICD-10-CM | POA: Diagnosis not present

## 2015-07-31 DIAGNOSIS — Z959 Presence of cardiac and vascular implant and graft, unspecified: Secondary | ICD-10-CM | POA: Diagnosis not present

## 2015-07-31 DIAGNOSIS — C099 Malignant neoplasm of tonsil, unspecified: Secondary | ICD-10-CM | POA: Diagnosis not present

## 2015-08-01 DIAGNOSIS — C090 Malignant neoplasm of tonsillar fossa: Secondary | ICD-10-CM | POA: Diagnosis not present

## 2015-08-01 DIAGNOSIS — R1011 Right upper quadrant pain: Secondary | ICD-10-CM | POA: Diagnosis not present

## 2015-08-01 DIAGNOSIS — Z510 Encounter for antineoplastic radiation therapy: Secondary | ICD-10-CM | POA: Diagnosis not present

## 2015-08-02 DIAGNOSIS — C099 Malignant neoplasm of tonsil, unspecified: Secondary | ICD-10-CM | POA: Diagnosis not present

## 2015-08-02 DIAGNOSIS — C090 Malignant neoplasm of tonsillar fossa: Secondary | ICD-10-CM | POA: Diagnosis not present

## 2015-08-02 DIAGNOSIS — I10 Essential (primary) hypertension: Secondary | ICD-10-CM | POA: Diagnosis not present

## 2015-08-02 DIAGNOSIS — K9420 Gastrostomy complication, unspecified: Secondary | ICD-10-CM | POA: Diagnosis not present

## 2015-08-02 DIAGNOSIS — Z931 Gastrostomy status: Secondary | ICD-10-CM | POA: Diagnosis not present

## 2015-08-02 DIAGNOSIS — Z959 Presence of cardiac and vascular implant and graft, unspecified: Secondary | ICD-10-CM | POA: Diagnosis not present

## 2015-08-02 DIAGNOSIS — R197 Diarrhea, unspecified: Secondary | ICD-10-CM | POA: Diagnosis not present

## 2015-08-05 DIAGNOSIS — I82432 Acute embolism and thrombosis of left popliteal vein: Secondary | ICD-10-CM | POA: Diagnosis not present

## 2015-08-05 DIAGNOSIS — Z959 Presence of cardiac and vascular implant and graft, unspecified: Secondary | ICD-10-CM | POA: Diagnosis not present

## 2015-08-05 DIAGNOSIS — C099 Malignant neoplasm of tonsil, unspecified: Secondary | ICD-10-CM | POA: Diagnosis not present

## 2015-08-05 DIAGNOSIS — K9420 Gastrostomy complication, unspecified: Secondary | ICD-10-CM | POA: Diagnosis not present

## 2015-08-05 DIAGNOSIS — Z931 Gastrostomy status: Secondary | ICD-10-CM | POA: Diagnosis not present

## 2015-08-05 DIAGNOSIS — C090 Malignant neoplasm of tonsillar fossa: Secondary | ICD-10-CM | POA: Diagnosis not present

## 2015-08-05 DIAGNOSIS — I10 Essential (primary) hypertension: Secondary | ICD-10-CM | POA: Diagnosis not present

## 2015-08-05 DIAGNOSIS — R197 Diarrhea, unspecified: Secondary | ICD-10-CM | POA: Diagnosis not present

## 2015-08-05 DIAGNOSIS — I82401 Acute embolism and thrombosis of unspecified deep veins of right lower extremity: Secondary | ICD-10-CM | POA: Diagnosis not present

## 2015-08-05 DIAGNOSIS — E860 Dehydration: Secondary | ICD-10-CM | POA: Diagnosis not present

## 2015-08-06 DIAGNOSIS — E860 Dehydration: Secondary | ICD-10-CM | POA: Diagnosis not present

## 2015-08-06 DIAGNOSIS — D72819 Decreased white blood cell count, unspecified: Secondary | ICD-10-CM | POA: Diagnosis not present

## 2015-08-06 DIAGNOSIS — R300 Dysuria: Secondary | ICD-10-CM | POA: Diagnosis not present

## 2015-08-06 DIAGNOSIS — C090 Malignant neoplasm of tonsillar fossa: Secondary | ICD-10-CM | POA: Diagnosis not present

## 2015-08-06 DIAGNOSIS — C07 Malignant neoplasm of parotid gland: Secondary | ICD-10-CM | POA: Diagnosis not present

## 2015-08-06 DIAGNOSIS — E538 Deficiency of other specified B group vitamins: Secondary | ICD-10-CM | POA: Diagnosis not present

## 2015-08-06 DIAGNOSIS — D708 Other neutropenia: Secondary | ICD-10-CM | POA: Diagnosis not present

## 2015-08-06 DIAGNOSIS — D649 Anemia, unspecified: Secondary | ICD-10-CM | POA: Diagnosis not present

## 2015-08-07 DIAGNOSIS — C099 Malignant neoplasm of tonsil, unspecified: Secondary | ICD-10-CM | POA: Diagnosis not present

## 2015-08-07 DIAGNOSIS — I82401 Acute embolism and thrombosis of unspecified deep veins of right lower extremity: Secondary | ICD-10-CM | POA: Diagnosis not present

## 2015-08-07 DIAGNOSIS — C090 Malignant neoplasm of tonsillar fossa: Secondary | ICD-10-CM | POA: Diagnosis not present

## 2015-08-07 DIAGNOSIS — R197 Diarrhea, unspecified: Secondary | ICD-10-CM | POA: Diagnosis not present

## 2015-08-07 DIAGNOSIS — Z7901 Long term (current) use of anticoagulants: Secondary | ICD-10-CM | POA: Diagnosis not present

## 2015-08-07 DIAGNOSIS — Z959 Presence of cardiac and vascular implant and graft, unspecified: Secondary | ICD-10-CM | POA: Diagnosis not present

## 2015-08-07 DIAGNOSIS — D709 Neutropenia, unspecified: Secondary | ICD-10-CM | POA: Diagnosis not present

## 2015-08-07 DIAGNOSIS — K9420 Gastrostomy complication, unspecified: Secondary | ICD-10-CM | POA: Diagnosis not present

## 2015-08-07 DIAGNOSIS — D72819 Decreased white blood cell count, unspecified: Secondary | ICD-10-CM | POA: Diagnosis not present

## 2015-08-07 DIAGNOSIS — I10 Essential (primary) hypertension: Secondary | ICD-10-CM | POA: Diagnosis not present

## 2015-08-07 DIAGNOSIS — Z931 Gastrostomy status: Secondary | ICD-10-CM | POA: Diagnosis not present

## 2015-08-08 DIAGNOSIS — C090 Malignant neoplasm of tonsillar fossa: Secondary | ICD-10-CM | POA: Diagnosis not present

## 2015-08-08 DIAGNOSIS — D72819 Decreased white blood cell count, unspecified: Secondary | ICD-10-CM | POA: Diagnosis not present

## 2015-08-08 DIAGNOSIS — D709 Neutropenia, unspecified: Secondary | ICD-10-CM | POA: Diagnosis not present

## 2015-08-08 DIAGNOSIS — I82401 Acute embolism and thrombosis of unspecified deep veins of right lower extremity: Secondary | ICD-10-CM | POA: Diagnosis not present

## 2015-08-08 DIAGNOSIS — E860 Dehydration: Secondary | ICD-10-CM | POA: Diagnosis not present

## 2015-08-08 DIAGNOSIS — Z7901 Long term (current) use of anticoagulants: Secondary | ICD-10-CM | POA: Diagnosis not present

## 2015-08-09 DIAGNOSIS — E860 Dehydration: Secondary | ICD-10-CM | POA: Diagnosis not present

## 2015-08-09 DIAGNOSIS — Z959 Presence of cardiac and vascular implant and graft, unspecified: Secondary | ICD-10-CM | POA: Diagnosis not present

## 2015-08-09 DIAGNOSIS — I82401 Acute embolism and thrombosis of unspecified deep veins of right lower extremity: Secondary | ICD-10-CM | POA: Diagnosis not present

## 2015-08-09 DIAGNOSIS — C099 Malignant neoplasm of tonsil, unspecified: Secondary | ICD-10-CM | POA: Diagnosis not present

## 2015-08-09 DIAGNOSIS — R197 Diarrhea, unspecified: Secondary | ICD-10-CM | POA: Diagnosis not present

## 2015-08-09 DIAGNOSIS — D709 Neutropenia, unspecified: Secondary | ICD-10-CM | POA: Diagnosis not present

## 2015-08-09 DIAGNOSIS — Z931 Gastrostomy status: Secondary | ICD-10-CM | POA: Diagnosis not present

## 2015-08-09 DIAGNOSIS — K9420 Gastrostomy complication, unspecified: Secondary | ICD-10-CM | POA: Diagnosis not present

## 2015-08-09 DIAGNOSIS — C090 Malignant neoplasm of tonsillar fossa: Secondary | ICD-10-CM | POA: Diagnosis not present

## 2015-08-09 DIAGNOSIS — I10 Essential (primary) hypertension: Secondary | ICD-10-CM | POA: Diagnosis not present

## 2015-08-09 DIAGNOSIS — Z7901 Long term (current) use of anticoagulants: Secondary | ICD-10-CM | POA: Diagnosis not present

## 2015-08-11 DIAGNOSIS — Z8619 Personal history of other infectious and parasitic diseases: Secondary | ICD-10-CM | POA: Diagnosis not present

## 2015-08-11 DIAGNOSIS — C099 Malignant neoplasm of tonsil, unspecified: Secondary | ICD-10-CM | POA: Diagnosis not present

## 2015-08-11 DIAGNOSIS — R531 Weakness: Secondary | ICD-10-CM | POA: Diagnosis not present

## 2015-08-11 DIAGNOSIS — R509 Fever, unspecified: Secondary | ICD-10-CM | POA: Diagnosis not present

## 2015-08-11 DIAGNOSIS — D709 Neutropenia, unspecified: Secondary | ICD-10-CM | POA: Diagnosis not present

## 2015-08-12 DIAGNOSIS — K9420 Gastrostomy complication, unspecified: Secondary | ICD-10-CM | POA: Diagnosis not present

## 2015-08-12 DIAGNOSIS — I10 Essential (primary) hypertension: Secondary | ICD-10-CM | POA: Diagnosis not present

## 2015-08-12 DIAGNOSIS — Z959 Presence of cardiac and vascular implant and graft, unspecified: Secondary | ICD-10-CM | POA: Diagnosis not present

## 2015-08-12 DIAGNOSIS — Z931 Gastrostomy status: Secondary | ICD-10-CM | POA: Diagnosis not present

## 2015-08-12 DIAGNOSIS — R197 Diarrhea, unspecified: Secondary | ICD-10-CM | POA: Diagnosis not present

## 2015-08-12 DIAGNOSIS — I951 Orthostatic hypotension: Secondary | ICD-10-CM | POA: Diagnosis not present

## 2015-08-12 DIAGNOSIS — C099 Malignant neoplasm of tonsil, unspecified: Secondary | ICD-10-CM | POA: Diagnosis not present

## 2015-08-12 DIAGNOSIS — C090 Malignant neoplasm of tonsillar fossa: Secondary | ICD-10-CM | POA: Diagnosis not present

## 2015-08-13 DIAGNOSIS — R509 Fever, unspecified: Secondary | ICD-10-CM | POA: Diagnosis not present

## 2015-08-13 DIAGNOSIS — C090 Malignant neoplasm of tonsillar fossa: Secondary | ICD-10-CM | POA: Diagnosis not present

## 2015-08-14 DIAGNOSIS — C090 Malignant neoplasm of tonsillar fossa: Secondary | ICD-10-CM | POA: Diagnosis not present

## 2015-08-14 DIAGNOSIS — I10 Essential (primary) hypertension: Secondary | ICD-10-CM | POA: Diagnosis not present

## 2015-08-14 DIAGNOSIS — Z931 Gastrostomy status: Secondary | ICD-10-CM | POA: Diagnosis not present

## 2015-08-14 DIAGNOSIS — R197 Diarrhea, unspecified: Secondary | ICD-10-CM | POA: Diagnosis not present

## 2015-08-14 DIAGNOSIS — Z959 Presence of cardiac and vascular implant and graft, unspecified: Secondary | ICD-10-CM | POA: Diagnosis not present

## 2015-08-14 DIAGNOSIS — C099 Malignant neoplasm of tonsil, unspecified: Secondary | ICD-10-CM | POA: Diagnosis not present

## 2015-08-14 DIAGNOSIS — K9420 Gastrostomy complication, unspecified: Secondary | ICD-10-CM | POA: Diagnosis not present

## 2015-08-15 DIAGNOSIS — R918 Other nonspecific abnormal finding of lung field: Secondary | ICD-10-CM | POA: Diagnosis not present

## 2015-08-15 DIAGNOSIS — C090 Malignant neoplasm of tonsillar fossa: Secondary | ICD-10-CM | POA: Diagnosis not present

## 2015-08-16 DIAGNOSIS — C099 Malignant neoplasm of tonsil, unspecified: Secondary | ICD-10-CM | POA: Diagnosis not present

## 2015-08-16 DIAGNOSIS — Z931 Gastrostomy status: Secondary | ICD-10-CM | POA: Diagnosis not present

## 2015-08-16 DIAGNOSIS — K9420 Gastrostomy complication, unspecified: Secondary | ICD-10-CM | POA: Diagnosis not present

## 2015-08-16 DIAGNOSIS — Z959 Presence of cardiac and vascular implant and graft, unspecified: Secondary | ICD-10-CM | POA: Diagnosis not present

## 2015-08-16 DIAGNOSIS — R197 Diarrhea, unspecified: Secondary | ICD-10-CM | POA: Diagnosis not present

## 2015-08-16 DIAGNOSIS — I10 Essential (primary) hypertension: Secondary | ICD-10-CM | POA: Diagnosis not present

## 2015-08-16 DIAGNOSIS — C090 Malignant neoplasm of tonsillar fossa: Secondary | ICD-10-CM | POA: Diagnosis not present

## 2015-08-19 DIAGNOSIS — Z931 Gastrostomy status: Secondary | ICD-10-CM | POA: Diagnosis not present

## 2015-08-19 DIAGNOSIS — R000 Tachycardia, unspecified: Secondary | ICD-10-CM | POA: Diagnosis not present

## 2015-08-19 DIAGNOSIS — C090 Malignant neoplasm of tonsillar fossa: Secondary | ICD-10-CM | POA: Diagnosis not present

## 2015-08-19 DIAGNOSIS — R197 Diarrhea, unspecified: Secondary | ICD-10-CM | POA: Diagnosis not present

## 2015-08-19 DIAGNOSIS — I10 Essential (primary) hypertension: Secondary | ICD-10-CM | POA: Diagnosis not present

## 2015-08-19 DIAGNOSIS — C099 Malignant neoplasm of tonsil, unspecified: Secondary | ICD-10-CM | POA: Diagnosis not present

## 2015-08-19 DIAGNOSIS — Z959 Presence of cardiac and vascular implant and graft, unspecified: Secondary | ICD-10-CM | POA: Diagnosis not present

## 2015-08-19 DIAGNOSIS — K9420 Gastrostomy complication, unspecified: Secondary | ICD-10-CM | POA: Diagnosis not present

## 2015-08-20 DIAGNOSIS — C090 Malignant neoplasm of tonsillar fossa: Secondary | ICD-10-CM | POA: Diagnosis not present

## 2015-08-21 DIAGNOSIS — I340 Nonrheumatic mitral (valve) insufficiency: Secondary | ICD-10-CM | POA: Diagnosis not present

## 2015-08-21 DIAGNOSIS — I361 Nonrheumatic tricuspid (valve) insufficiency: Secondary | ICD-10-CM | POA: Diagnosis not present

## 2015-08-21 DIAGNOSIS — K9420 Gastrostomy complication, unspecified: Secondary | ICD-10-CM | POA: Diagnosis not present

## 2015-08-21 DIAGNOSIS — I517 Cardiomegaly: Secondary | ICD-10-CM | POA: Diagnosis not present

## 2015-08-21 DIAGNOSIS — Z959 Presence of cardiac and vascular implant and graft, unspecified: Secondary | ICD-10-CM | POA: Diagnosis not present

## 2015-08-21 DIAGNOSIS — R197 Diarrhea, unspecified: Secondary | ICD-10-CM | POA: Diagnosis not present

## 2015-08-21 DIAGNOSIS — C090 Malignant neoplasm of tonsillar fossa: Secondary | ICD-10-CM | POA: Diagnosis not present

## 2015-08-21 DIAGNOSIS — C099 Malignant neoplasm of tonsil, unspecified: Secondary | ICD-10-CM | POA: Diagnosis not present

## 2015-08-21 DIAGNOSIS — I10 Essential (primary) hypertension: Secondary | ICD-10-CM | POA: Diagnosis not present

## 2015-08-21 DIAGNOSIS — Z931 Gastrostomy status: Secondary | ICD-10-CM | POA: Diagnosis not present

## 2015-08-21 DIAGNOSIS — D708 Other neutropenia: Secondary | ICD-10-CM | POA: Diagnosis not present

## 2015-08-22 DIAGNOSIS — C090 Malignant neoplasm of tonsillar fossa: Secondary | ICD-10-CM | POA: Diagnosis not present

## 2015-08-23 DIAGNOSIS — C099 Malignant neoplasm of tonsil, unspecified: Secondary | ICD-10-CM | POA: Diagnosis not present

## 2015-08-23 DIAGNOSIS — Z931 Gastrostomy status: Secondary | ICD-10-CM | POA: Diagnosis not present

## 2015-08-23 DIAGNOSIS — I10 Essential (primary) hypertension: Secondary | ICD-10-CM | POA: Diagnosis not present

## 2015-08-23 DIAGNOSIS — C090 Malignant neoplasm of tonsillar fossa: Secondary | ICD-10-CM | POA: Diagnosis not present

## 2015-08-23 DIAGNOSIS — Z959 Presence of cardiac and vascular implant and graft, unspecified: Secondary | ICD-10-CM | POA: Diagnosis not present

## 2015-08-23 DIAGNOSIS — R197 Diarrhea, unspecified: Secondary | ICD-10-CM | POA: Diagnosis not present

## 2015-08-23 DIAGNOSIS — K9420 Gastrostomy complication, unspecified: Secondary | ICD-10-CM | POA: Diagnosis not present

## 2015-08-26 DIAGNOSIS — R197 Diarrhea, unspecified: Secondary | ICD-10-CM | POA: Diagnosis not present

## 2015-08-26 DIAGNOSIS — K9420 Gastrostomy complication, unspecified: Secondary | ICD-10-CM | POA: Diagnosis not present

## 2015-08-26 DIAGNOSIS — I10 Essential (primary) hypertension: Secondary | ICD-10-CM | POA: Diagnosis not present

## 2015-08-26 DIAGNOSIS — C090 Malignant neoplasm of tonsillar fossa: Secondary | ICD-10-CM | POA: Diagnosis not present

## 2015-08-26 DIAGNOSIS — C099 Malignant neoplasm of tonsil, unspecified: Secondary | ICD-10-CM | POA: Diagnosis not present

## 2015-08-26 DIAGNOSIS — Z959 Presence of cardiac and vascular implant and graft, unspecified: Secondary | ICD-10-CM | POA: Diagnosis not present

## 2015-08-26 DIAGNOSIS — Z931 Gastrostomy status: Secondary | ICD-10-CM | POA: Diagnosis not present

## 2015-08-27 DIAGNOSIS — Z87891 Personal history of nicotine dependence: Secondary | ICD-10-CM | POA: Diagnosis not present

## 2015-08-27 DIAGNOSIS — C090 Malignant neoplasm of tonsillar fossa: Secondary | ICD-10-CM | POA: Diagnosis not present

## 2015-08-27 DIAGNOSIS — K5790 Diverticulosis of intestine, part unspecified, without perforation or abscess without bleeding: Secondary | ICD-10-CM | POA: Diagnosis not present

## 2015-08-27 DIAGNOSIS — Z4800 Encounter for change or removal of nonsurgical wound dressing: Secondary | ICD-10-CM | POA: Diagnosis not present

## 2015-08-27 DIAGNOSIS — Y842 Radiological procedure and radiotherapy as the cause of abnormal reaction of the patient, or of later complication, without mention of misadventure at the time of the procedure: Secondary | ICD-10-CM | POA: Diagnosis not present

## 2015-08-27 DIAGNOSIS — I82401 Acute embolism and thrombosis of unspecified deep veins of right lower extremity: Secondary | ICD-10-CM | POA: Diagnosis not present

## 2015-08-27 DIAGNOSIS — T2007XD Burn of unspecified degree of neck, subsequent encounter: Secondary | ICD-10-CM | POA: Diagnosis not present

## 2015-08-27 DIAGNOSIS — Z452 Encounter for adjustment and management of vascular access device: Secondary | ICD-10-CM | POA: Diagnosis not present

## 2015-08-27 DIAGNOSIS — F329 Major depressive disorder, single episode, unspecified: Secondary | ICD-10-CM | POA: Diagnosis not present

## 2015-08-27 DIAGNOSIS — K9422 Gastrostomy infection: Secondary | ICD-10-CM | POA: Diagnosis not present

## 2015-08-27 DIAGNOSIS — R1310 Dysphagia, unspecified: Secondary | ICD-10-CM | POA: Diagnosis not present

## 2015-08-27 DIAGNOSIS — I10 Essential (primary) hypertension: Secondary | ICD-10-CM | POA: Diagnosis not present

## 2015-08-27 DIAGNOSIS — Z79899 Other long term (current) drug therapy: Secondary | ICD-10-CM | POA: Diagnosis not present

## 2015-08-28 DIAGNOSIS — R1310 Dysphagia, unspecified: Secondary | ICD-10-CM | POA: Diagnosis not present

## 2015-08-28 DIAGNOSIS — I82401 Acute embolism and thrombosis of unspecified deep veins of right lower extremity: Secondary | ICD-10-CM | POA: Diagnosis not present

## 2015-08-28 DIAGNOSIS — K9422 Gastrostomy infection: Secondary | ICD-10-CM | POA: Diagnosis not present

## 2015-08-28 DIAGNOSIS — C090 Malignant neoplasm of tonsillar fossa: Secondary | ICD-10-CM | POA: Diagnosis not present

## 2015-08-28 DIAGNOSIS — T2007XD Burn of unspecified degree of neck, subsequent encounter: Secondary | ICD-10-CM | POA: Diagnosis not present

## 2015-08-28 DIAGNOSIS — Z452 Encounter for adjustment and management of vascular access device: Secondary | ICD-10-CM | POA: Diagnosis not present

## 2015-08-29 DIAGNOSIS — K9422 Gastrostomy infection: Secondary | ICD-10-CM | POA: Diagnosis not present

## 2015-08-29 DIAGNOSIS — C090 Malignant neoplasm of tonsillar fossa: Secondary | ICD-10-CM | POA: Diagnosis not present

## 2015-08-29 DIAGNOSIS — T2009XD Burn of unspecified degree of multiple sites of head, face, and neck, subsequent encounter: Secondary | ICD-10-CM | POA: Diagnosis not present

## 2015-08-29 DIAGNOSIS — I10 Essential (primary) hypertension: Secondary | ICD-10-CM | POA: Diagnosis not present

## 2015-08-29 DIAGNOSIS — T2007XD Burn of unspecified degree of neck, subsequent encounter: Secondary | ICD-10-CM | POA: Diagnosis not present

## 2015-08-29 DIAGNOSIS — K5790 Diverticulosis of intestine, part unspecified, without perforation or abscess without bleeding: Secondary | ICD-10-CM | POA: Diagnosis not present

## 2015-08-29 DIAGNOSIS — Z452 Encounter for adjustment and management of vascular access device: Secondary | ICD-10-CM | POA: Diagnosis not present

## 2015-08-29 DIAGNOSIS — R1310 Dysphagia, unspecified: Secondary | ICD-10-CM | POA: Diagnosis not present

## 2015-08-29 DIAGNOSIS — I82401 Acute embolism and thrombosis of unspecified deep veins of right lower extremity: Secondary | ICD-10-CM | POA: Diagnosis not present

## 2015-08-30 DIAGNOSIS — C090 Malignant neoplasm of tonsillar fossa: Secondary | ICD-10-CM | POA: Diagnosis not present

## 2015-08-30 DIAGNOSIS — T2007XD Burn of unspecified degree of neck, subsequent encounter: Secondary | ICD-10-CM | POA: Diagnosis not present

## 2015-08-30 DIAGNOSIS — R1310 Dysphagia, unspecified: Secondary | ICD-10-CM | POA: Diagnosis not present

## 2015-08-30 DIAGNOSIS — Z452 Encounter for adjustment and management of vascular access device: Secondary | ICD-10-CM | POA: Diagnosis not present

## 2015-08-30 DIAGNOSIS — I82401 Acute embolism and thrombosis of unspecified deep veins of right lower extremity: Secondary | ICD-10-CM | POA: Diagnosis not present

## 2015-08-30 DIAGNOSIS — K9422 Gastrostomy infection: Secondary | ICD-10-CM | POA: Diagnosis not present

## 2015-09-02 DIAGNOSIS — C090 Malignant neoplasm of tonsillar fossa: Secondary | ICD-10-CM | POA: Diagnosis not present

## 2015-09-02 DIAGNOSIS — R05 Cough: Secondary | ICD-10-CM | POA: Diagnosis not present

## 2015-09-02 DIAGNOSIS — R1310 Dysphagia, unspecified: Secondary | ICD-10-CM | POA: Diagnosis not present

## 2015-09-02 DIAGNOSIS — I82401 Acute embolism and thrombosis of unspecified deep veins of right lower extremity: Secondary | ICD-10-CM | POA: Diagnosis not present

## 2015-09-02 DIAGNOSIS — C099 Malignant neoplasm of tonsil, unspecified: Secondary | ICD-10-CM | POA: Diagnosis not present

## 2015-09-02 DIAGNOSIS — K9422 Gastrostomy infection: Secondary | ICD-10-CM | POA: Diagnosis not present

## 2015-09-02 DIAGNOSIS — D649 Anemia, unspecified: Secondary | ICD-10-CM | POA: Diagnosis not present

## 2015-09-02 DIAGNOSIS — I10 Essential (primary) hypertension: Secondary | ICD-10-CM | POA: Diagnosis not present

## 2015-09-02 DIAGNOSIS — K5790 Diverticulosis of intestine, part unspecified, without perforation or abscess without bleeding: Secondary | ICD-10-CM | POA: Diagnosis not present

## 2015-09-02 DIAGNOSIS — T2009XD Burn of unspecified degree of multiple sites of head, face, and neck, subsequent encounter: Secondary | ICD-10-CM | POA: Diagnosis not present

## 2015-09-02 DIAGNOSIS — Z452 Encounter for adjustment and management of vascular access device: Secondary | ICD-10-CM | POA: Diagnosis not present

## 2015-09-02 DIAGNOSIS — T2007XD Burn of unspecified degree of neck, subsequent encounter: Secondary | ICD-10-CM | POA: Diagnosis not present

## 2015-09-04 DIAGNOSIS — I82401 Acute embolism and thrombosis of unspecified deep veins of right lower extremity: Secondary | ICD-10-CM | POA: Diagnosis not present

## 2015-09-04 DIAGNOSIS — R1310 Dysphagia, unspecified: Secondary | ICD-10-CM | POA: Diagnosis not present

## 2015-09-04 DIAGNOSIS — C090 Malignant neoplasm of tonsillar fossa: Secondary | ICD-10-CM | POA: Diagnosis not present

## 2015-09-04 DIAGNOSIS — K9422 Gastrostomy infection: Secondary | ICD-10-CM | POA: Diagnosis not present

## 2015-09-04 DIAGNOSIS — T2007XD Burn of unspecified degree of neck, subsequent encounter: Secondary | ICD-10-CM | POA: Diagnosis not present

## 2015-09-04 DIAGNOSIS — Z452 Encounter for adjustment and management of vascular access device: Secondary | ICD-10-CM | POA: Diagnosis not present

## 2015-09-06 DIAGNOSIS — I82401 Acute embolism and thrombosis of unspecified deep veins of right lower extremity: Secondary | ICD-10-CM | POA: Diagnosis not present

## 2015-09-06 DIAGNOSIS — Z452 Encounter for adjustment and management of vascular access device: Secondary | ICD-10-CM | POA: Diagnosis not present

## 2015-09-06 DIAGNOSIS — K9422 Gastrostomy infection: Secondary | ICD-10-CM | POA: Diagnosis not present

## 2015-09-06 DIAGNOSIS — C090 Malignant neoplasm of tonsillar fossa: Secondary | ICD-10-CM | POA: Diagnosis not present

## 2015-09-06 DIAGNOSIS — R1310 Dysphagia, unspecified: Secondary | ICD-10-CM | POA: Diagnosis not present

## 2015-09-06 DIAGNOSIS — T2007XD Burn of unspecified degree of neck, subsequent encounter: Secondary | ICD-10-CM | POA: Diagnosis not present

## 2015-09-09 DIAGNOSIS — T2007XD Burn of unspecified degree of neck, subsequent encounter: Secondary | ICD-10-CM | POA: Diagnosis not present

## 2015-09-09 DIAGNOSIS — I10 Essential (primary) hypertension: Secondary | ICD-10-CM | POA: Diagnosis not present

## 2015-09-09 DIAGNOSIS — T2009XD Burn of unspecified degree of multiple sites of head, face, and neck, subsequent encounter: Secondary | ICD-10-CM | POA: Diagnosis not present

## 2015-09-09 DIAGNOSIS — K5790 Diverticulosis of intestine, part unspecified, without perforation or abscess without bleeding: Secondary | ICD-10-CM | POA: Diagnosis not present

## 2015-09-09 DIAGNOSIS — C090 Malignant neoplasm of tonsillar fossa: Secondary | ICD-10-CM | POA: Diagnosis not present

## 2015-09-09 DIAGNOSIS — I82401 Acute embolism and thrombosis of unspecified deep veins of right lower extremity: Secondary | ICD-10-CM | POA: Diagnosis not present

## 2015-09-09 DIAGNOSIS — K9422 Gastrostomy infection: Secondary | ICD-10-CM | POA: Diagnosis not present

## 2015-09-09 DIAGNOSIS — Z452 Encounter for adjustment and management of vascular access device: Secondary | ICD-10-CM | POA: Diagnosis not present

## 2015-09-09 DIAGNOSIS — R1310 Dysphagia, unspecified: Secondary | ICD-10-CM | POA: Diagnosis not present

## 2015-09-10 DIAGNOSIS — C099 Malignant neoplasm of tonsil, unspecified: Secondary | ICD-10-CM | POA: Diagnosis not present

## 2015-09-11 DIAGNOSIS — C090 Malignant neoplasm of tonsillar fossa: Secondary | ICD-10-CM | POA: Diagnosis not present

## 2015-09-11 DIAGNOSIS — K9422 Gastrostomy infection: Secondary | ICD-10-CM | POA: Diagnosis not present

## 2015-09-11 DIAGNOSIS — E860 Dehydration: Secondary | ICD-10-CM | POA: Diagnosis not present

## 2015-09-11 DIAGNOSIS — R1310 Dysphagia, unspecified: Secondary | ICD-10-CM | POA: Diagnosis not present

## 2015-09-11 DIAGNOSIS — Z452 Encounter for adjustment and management of vascular access device: Secondary | ICD-10-CM | POA: Diagnosis not present

## 2015-09-11 DIAGNOSIS — T2007XD Burn of unspecified degree of neck, subsequent encounter: Secondary | ICD-10-CM | POA: Diagnosis not present

## 2015-09-11 DIAGNOSIS — I82401 Acute embolism and thrombosis of unspecified deep veins of right lower extremity: Secondary | ICD-10-CM | POA: Diagnosis not present

## 2015-09-13 DIAGNOSIS — C090 Malignant neoplasm of tonsillar fossa: Secondary | ICD-10-CM | POA: Diagnosis not present

## 2015-09-13 DIAGNOSIS — I82401 Acute embolism and thrombosis of unspecified deep veins of right lower extremity: Secondary | ICD-10-CM | POA: Diagnosis not present

## 2015-09-13 DIAGNOSIS — Z452 Encounter for adjustment and management of vascular access device: Secondary | ICD-10-CM | POA: Diagnosis not present

## 2015-09-13 DIAGNOSIS — K9422 Gastrostomy infection: Secondary | ICD-10-CM | POA: Diagnosis not present

## 2015-09-13 DIAGNOSIS — R1310 Dysphagia, unspecified: Secondary | ICD-10-CM | POA: Diagnosis not present

## 2015-09-13 DIAGNOSIS — T2007XD Burn of unspecified degree of neck, subsequent encounter: Secondary | ICD-10-CM | POA: Diagnosis not present

## 2015-09-16 DIAGNOSIS — I10 Essential (primary) hypertension: Secondary | ICD-10-CM | POA: Diagnosis not present

## 2015-09-16 DIAGNOSIS — R1310 Dysphagia, unspecified: Secondary | ICD-10-CM | POA: Diagnosis not present

## 2015-09-16 DIAGNOSIS — I82401 Acute embolism and thrombosis of unspecified deep veins of right lower extremity: Secondary | ICD-10-CM | POA: Diagnosis not present

## 2015-09-16 DIAGNOSIS — K9422 Gastrostomy infection: Secondary | ICD-10-CM | POA: Diagnosis not present

## 2015-09-16 DIAGNOSIS — C090 Malignant neoplasm of tonsillar fossa: Secondary | ICD-10-CM | POA: Diagnosis not present

## 2015-09-16 DIAGNOSIS — T2007XD Burn of unspecified degree of neck, subsequent encounter: Secondary | ICD-10-CM | POA: Diagnosis not present

## 2015-09-16 DIAGNOSIS — Z452 Encounter for adjustment and management of vascular access device: Secondary | ICD-10-CM | POA: Diagnosis not present

## 2015-09-16 DIAGNOSIS — K5790 Diverticulosis of intestine, part unspecified, without perforation or abscess without bleeding: Secondary | ICD-10-CM | POA: Diagnosis not present

## 2015-09-18 DIAGNOSIS — Z452 Encounter for adjustment and management of vascular access device: Secondary | ICD-10-CM | POA: Diagnosis not present

## 2015-09-18 DIAGNOSIS — T2007XD Burn of unspecified degree of neck, subsequent encounter: Secondary | ICD-10-CM | POA: Diagnosis not present

## 2015-09-18 DIAGNOSIS — K9422 Gastrostomy infection: Secondary | ICD-10-CM | POA: Diagnosis not present

## 2015-09-18 DIAGNOSIS — C090 Malignant neoplasm of tonsillar fossa: Secondary | ICD-10-CM | POA: Diagnosis not present

## 2015-09-18 DIAGNOSIS — R1310 Dysphagia, unspecified: Secondary | ICD-10-CM | POA: Diagnosis not present

## 2015-09-18 DIAGNOSIS — I82401 Acute embolism and thrombosis of unspecified deep veins of right lower extremity: Secondary | ICD-10-CM | POA: Diagnosis not present

## 2015-09-19 DIAGNOSIS — C090 Malignant neoplasm of tonsillar fossa: Secondary | ICD-10-CM | POA: Diagnosis not present

## 2015-09-19 DIAGNOSIS — R2689 Other abnormalities of gait and mobility: Secondary | ICD-10-CM | POA: Diagnosis not present

## 2015-09-19 DIAGNOSIS — D696 Thrombocytopenia, unspecified: Secondary | ICD-10-CM | POA: Diagnosis not present

## 2015-09-19 DIAGNOSIS — R2681 Unsteadiness on feet: Secondary | ICD-10-CM | POA: Diagnosis not present

## 2015-09-19 DIAGNOSIS — M6281 Muscle weakness (generalized): Secondary | ICD-10-CM | POA: Diagnosis not present

## 2015-09-19 DIAGNOSIS — R5383 Other fatigue: Secondary | ICD-10-CM | POA: Diagnosis not present

## 2015-09-20 DIAGNOSIS — Z452 Encounter for adjustment and management of vascular access device: Secondary | ICD-10-CM | POA: Diagnosis not present

## 2015-09-20 DIAGNOSIS — R1310 Dysphagia, unspecified: Secondary | ICD-10-CM | POA: Diagnosis not present

## 2015-09-20 DIAGNOSIS — K9422 Gastrostomy infection: Secondary | ICD-10-CM | POA: Diagnosis not present

## 2015-09-20 DIAGNOSIS — C090 Malignant neoplasm of tonsillar fossa: Secondary | ICD-10-CM | POA: Diagnosis not present

## 2015-09-20 DIAGNOSIS — I82401 Acute embolism and thrombosis of unspecified deep veins of right lower extremity: Secondary | ICD-10-CM | POA: Diagnosis not present

## 2015-09-20 DIAGNOSIS — T2007XD Burn of unspecified degree of neck, subsequent encounter: Secondary | ICD-10-CM | POA: Diagnosis not present

## 2015-09-23 DIAGNOSIS — T2007XD Burn of unspecified degree of neck, subsequent encounter: Secondary | ICD-10-CM | POA: Diagnosis not present

## 2015-09-23 DIAGNOSIS — Z452 Encounter for adjustment and management of vascular access device: Secondary | ICD-10-CM | POA: Diagnosis not present

## 2015-09-23 DIAGNOSIS — C090 Malignant neoplasm of tonsillar fossa: Secondary | ICD-10-CM | POA: Diagnosis not present

## 2015-09-23 DIAGNOSIS — K9422 Gastrostomy infection: Secondary | ICD-10-CM | POA: Diagnosis not present

## 2015-09-23 DIAGNOSIS — R1310 Dysphagia, unspecified: Secondary | ICD-10-CM | POA: Diagnosis not present

## 2015-09-23 DIAGNOSIS — I82401 Acute embolism and thrombosis of unspecified deep veins of right lower extremity: Secondary | ICD-10-CM | POA: Diagnosis not present

## 2015-09-24 DIAGNOSIS — C090 Malignant neoplasm of tonsillar fossa: Secondary | ICD-10-CM | POA: Diagnosis not present

## 2015-09-25 DIAGNOSIS — R1310 Dysphagia, unspecified: Secondary | ICD-10-CM | POA: Diagnosis not present

## 2015-09-25 DIAGNOSIS — K9422 Gastrostomy infection: Secondary | ICD-10-CM | POA: Diagnosis not present

## 2015-09-25 DIAGNOSIS — I82401 Acute embolism and thrombosis of unspecified deep veins of right lower extremity: Secondary | ICD-10-CM | POA: Diagnosis not present

## 2015-09-25 DIAGNOSIS — T2007XD Burn of unspecified degree of neck, subsequent encounter: Secondary | ICD-10-CM | POA: Diagnosis not present

## 2015-09-25 DIAGNOSIS — Z452 Encounter for adjustment and management of vascular access device: Secondary | ICD-10-CM | POA: Diagnosis not present

## 2015-09-25 DIAGNOSIS — C090 Malignant neoplasm of tonsillar fossa: Secondary | ICD-10-CM | POA: Diagnosis not present

## 2015-09-27 DIAGNOSIS — C090 Malignant neoplasm of tonsillar fossa: Secondary | ICD-10-CM | POA: Diagnosis not present

## 2015-09-27 DIAGNOSIS — K9422 Gastrostomy infection: Secondary | ICD-10-CM | POA: Diagnosis not present

## 2015-09-27 DIAGNOSIS — I82401 Acute embolism and thrombosis of unspecified deep veins of right lower extremity: Secondary | ICD-10-CM | POA: Diagnosis not present

## 2015-09-27 DIAGNOSIS — T2007XD Burn of unspecified degree of neck, subsequent encounter: Secondary | ICD-10-CM | POA: Diagnosis not present

## 2015-09-27 DIAGNOSIS — R1310 Dysphagia, unspecified: Secondary | ICD-10-CM | POA: Diagnosis not present

## 2015-09-27 DIAGNOSIS — Z452 Encounter for adjustment and management of vascular access device: Secondary | ICD-10-CM | POA: Diagnosis not present

## 2015-09-30 DIAGNOSIS — Z452 Encounter for adjustment and management of vascular access device: Secondary | ICD-10-CM | POA: Diagnosis not present

## 2015-09-30 DIAGNOSIS — T2007XD Burn of unspecified degree of neck, subsequent encounter: Secondary | ICD-10-CM | POA: Diagnosis not present

## 2015-09-30 DIAGNOSIS — I82401 Acute embolism and thrombosis of unspecified deep veins of right lower extremity: Secondary | ICD-10-CM | POA: Diagnosis not present

## 2015-09-30 DIAGNOSIS — K9422 Gastrostomy infection: Secondary | ICD-10-CM | POA: Diagnosis not present

## 2015-09-30 DIAGNOSIS — R1310 Dysphagia, unspecified: Secondary | ICD-10-CM | POA: Diagnosis not present

## 2015-09-30 DIAGNOSIS — C090 Malignant neoplasm of tonsillar fossa: Secondary | ICD-10-CM | POA: Diagnosis not present

## 2015-10-01 DIAGNOSIS — T2007XD Burn of unspecified degree of neck, subsequent encounter: Secondary | ICD-10-CM | POA: Diagnosis not present

## 2015-10-01 DIAGNOSIS — I82401 Acute embolism and thrombosis of unspecified deep veins of right lower extremity: Secondary | ICD-10-CM | POA: Diagnosis not present

## 2015-10-01 DIAGNOSIS — Z452 Encounter for adjustment and management of vascular access device: Secondary | ICD-10-CM | POA: Diagnosis not present

## 2015-10-01 DIAGNOSIS — C090 Malignant neoplasm of tonsillar fossa: Secondary | ICD-10-CM | POA: Diagnosis not present

## 2015-10-01 DIAGNOSIS — Z09 Encounter for follow-up examination after completed treatment for conditions other than malignant neoplasm: Secondary | ICD-10-CM | POA: Diagnosis not present

## 2015-10-01 DIAGNOSIS — K9422 Gastrostomy infection: Secondary | ICD-10-CM | POA: Diagnosis not present

## 2015-10-01 DIAGNOSIS — R1310 Dysphagia, unspecified: Secondary | ICD-10-CM | POA: Diagnosis not present

## 2015-10-02 DIAGNOSIS — I82401 Acute embolism and thrombosis of unspecified deep veins of right lower extremity: Secondary | ICD-10-CM | POA: Diagnosis not present

## 2015-10-02 DIAGNOSIS — R1310 Dysphagia, unspecified: Secondary | ICD-10-CM | POA: Diagnosis not present

## 2015-10-02 DIAGNOSIS — K9422 Gastrostomy infection: Secondary | ICD-10-CM | POA: Diagnosis not present

## 2015-10-02 DIAGNOSIS — T2007XD Burn of unspecified degree of neck, subsequent encounter: Secondary | ICD-10-CM | POA: Diagnosis not present

## 2015-10-02 DIAGNOSIS — C090 Malignant neoplasm of tonsillar fossa: Secondary | ICD-10-CM | POA: Diagnosis not present

## 2015-10-02 DIAGNOSIS — Z452 Encounter for adjustment and management of vascular access device: Secondary | ICD-10-CM | POA: Diagnosis not present

## 2015-10-04 DIAGNOSIS — Z452 Encounter for adjustment and management of vascular access device: Secondary | ICD-10-CM | POA: Diagnosis not present

## 2015-10-04 DIAGNOSIS — I82401 Acute embolism and thrombosis of unspecified deep veins of right lower extremity: Secondary | ICD-10-CM | POA: Diagnosis not present

## 2015-10-04 DIAGNOSIS — K9422 Gastrostomy infection: Secondary | ICD-10-CM | POA: Diagnosis not present

## 2015-10-04 DIAGNOSIS — T2007XD Burn of unspecified degree of neck, subsequent encounter: Secondary | ICD-10-CM | POA: Diagnosis not present

## 2015-10-04 DIAGNOSIS — C090 Malignant neoplasm of tonsillar fossa: Secondary | ICD-10-CM | POA: Diagnosis not present

## 2015-10-04 DIAGNOSIS — R1310 Dysphagia, unspecified: Secondary | ICD-10-CM | POA: Diagnosis not present

## 2015-10-07 DIAGNOSIS — I82401 Acute embolism and thrombosis of unspecified deep veins of right lower extremity: Secondary | ICD-10-CM | POA: Diagnosis not present

## 2015-10-07 DIAGNOSIS — K9422 Gastrostomy infection: Secondary | ICD-10-CM | POA: Diagnosis not present

## 2015-10-07 DIAGNOSIS — R1310 Dysphagia, unspecified: Secondary | ICD-10-CM | POA: Diagnosis not present

## 2015-10-07 DIAGNOSIS — Z452 Encounter for adjustment and management of vascular access device: Secondary | ICD-10-CM | POA: Diagnosis not present

## 2015-10-07 DIAGNOSIS — T2007XD Burn of unspecified degree of neck, subsequent encounter: Secondary | ICD-10-CM | POA: Diagnosis not present

## 2015-10-07 DIAGNOSIS — C090 Malignant neoplasm of tonsillar fossa: Secondary | ICD-10-CM | POA: Diagnosis not present

## 2015-10-08 DIAGNOSIS — I82401 Acute embolism and thrombosis of unspecified deep veins of right lower extremity: Secondary | ICD-10-CM | POA: Diagnosis not present

## 2015-10-08 DIAGNOSIS — T2007XD Burn of unspecified degree of neck, subsequent encounter: Secondary | ICD-10-CM | POA: Diagnosis not present

## 2015-10-08 DIAGNOSIS — Z452 Encounter for adjustment and management of vascular access device: Secondary | ICD-10-CM | POA: Diagnosis not present

## 2015-10-08 DIAGNOSIS — K9422 Gastrostomy infection: Secondary | ICD-10-CM | POA: Diagnosis not present

## 2015-10-08 DIAGNOSIS — R1310 Dysphagia, unspecified: Secondary | ICD-10-CM | POA: Diagnosis not present

## 2015-10-08 DIAGNOSIS — C090 Malignant neoplasm of tonsillar fossa: Secondary | ICD-10-CM | POA: Diagnosis not present

## 2015-10-09 DIAGNOSIS — C090 Malignant neoplasm of tonsillar fossa: Secondary | ICD-10-CM | POA: Diagnosis not present

## 2015-10-09 DIAGNOSIS — R1310 Dysphagia, unspecified: Secondary | ICD-10-CM | POA: Diagnosis not present

## 2015-10-09 DIAGNOSIS — T2007XD Burn of unspecified degree of neck, subsequent encounter: Secondary | ICD-10-CM | POA: Diagnosis not present

## 2015-10-09 DIAGNOSIS — K9422 Gastrostomy infection: Secondary | ICD-10-CM | POA: Diagnosis not present

## 2015-10-09 DIAGNOSIS — I82401 Acute embolism and thrombosis of unspecified deep veins of right lower extremity: Secondary | ICD-10-CM | POA: Diagnosis not present

## 2015-10-09 DIAGNOSIS — Z452 Encounter for adjustment and management of vascular access device: Secondary | ICD-10-CM | POA: Diagnosis not present

## 2015-10-10 DIAGNOSIS — I82401 Acute embolism and thrombosis of unspecified deep veins of right lower extremity: Secondary | ICD-10-CM | POA: Diagnosis not present

## 2015-10-10 DIAGNOSIS — Z452 Encounter for adjustment and management of vascular access device: Secondary | ICD-10-CM | POA: Diagnosis not present

## 2015-10-10 DIAGNOSIS — K9422 Gastrostomy infection: Secondary | ICD-10-CM | POA: Diagnosis not present

## 2015-10-10 DIAGNOSIS — C090 Malignant neoplasm of tonsillar fossa: Secondary | ICD-10-CM | POA: Diagnosis not present

## 2015-10-10 DIAGNOSIS — T2007XD Burn of unspecified degree of neck, subsequent encounter: Secondary | ICD-10-CM | POA: Diagnosis not present

## 2015-10-10 DIAGNOSIS — R1310 Dysphagia, unspecified: Secondary | ICD-10-CM | POA: Diagnosis not present

## 2015-10-11 DIAGNOSIS — T2007XD Burn of unspecified degree of neck, subsequent encounter: Secondary | ICD-10-CM | POA: Diagnosis not present

## 2015-10-11 DIAGNOSIS — I82401 Acute embolism and thrombosis of unspecified deep veins of right lower extremity: Secondary | ICD-10-CM | POA: Diagnosis not present

## 2015-10-11 DIAGNOSIS — C090 Malignant neoplasm of tonsillar fossa: Secondary | ICD-10-CM | POA: Diagnosis not present

## 2015-10-11 DIAGNOSIS — Z452 Encounter for adjustment and management of vascular access device: Secondary | ICD-10-CM | POA: Diagnosis not present

## 2015-10-11 DIAGNOSIS — K9422 Gastrostomy infection: Secondary | ICD-10-CM | POA: Diagnosis not present

## 2015-10-11 DIAGNOSIS — R1310 Dysphagia, unspecified: Secondary | ICD-10-CM | POA: Diagnosis not present

## 2015-10-15 DIAGNOSIS — Z452 Encounter for adjustment and management of vascular access device: Secondary | ICD-10-CM | POA: Diagnosis not present

## 2015-10-15 DIAGNOSIS — R1310 Dysphagia, unspecified: Secondary | ICD-10-CM | POA: Diagnosis not present

## 2015-10-15 DIAGNOSIS — K9422 Gastrostomy infection: Secondary | ICD-10-CM | POA: Diagnosis not present

## 2015-10-15 DIAGNOSIS — C090 Malignant neoplasm of tonsillar fossa: Secondary | ICD-10-CM | POA: Diagnosis not present

## 2015-10-15 DIAGNOSIS — I82401 Acute embolism and thrombosis of unspecified deep veins of right lower extremity: Secondary | ICD-10-CM | POA: Diagnosis not present

## 2015-10-15 DIAGNOSIS — T2007XD Burn of unspecified degree of neck, subsequent encounter: Secondary | ICD-10-CM | POA: Diagnosis not present

## 2015-10-16 DIAGNOSIS — T2007XD Burn of unspecified degree of neck, subsequent encounter: Secondary | ICD-10-CM | POA: Diagnosis not present

## 2015-10-16 DIAGNOSIS — K9422 Gastrostomy infection: Secondary | ICD-10-CM | POA: Diagnosis not present

## 2015-10-16 DIAGNOSIS — Z452 Encounter for adjustment and management of vascular access device: Secondary | ICD-10-CM | POA: Diagnosis not present

## 2015-10-16 DIAGNOSIS — R1310 Dysphagia, unspecified: Secondary | ICD-10-CM | POA: Diagnosis not present

## 2015-10-16 DIAGNOSIS — C090 Malignant neoplasm of tonsillar fossa: Secondary | ICD-10-CM | POA: Diagnosis not present

## 2015-10-16 DIAGNOSIS — I82401 Acute embolism and thrombosis of unspecified deep veins of right lower extremity: Secondary | ICD-10-CM | POA: Diagnosis not present

## 2015-10-17 DIAGNOSIS — C090 Malignant neoplasm of tonsillar fossa: Secondary | ICD-10-CM | POA: Diagnosis not present

## 2015-10-18 DIAGNOSIS — T2007XD Burn of unspecified degree of neck, subsequent encounter: Secondary | ICD-10-CM | POA: Diagnosis not present

## 2015-10-18 DIAGNOSIS — K9422 Gastrostomy infection: Secondary | ICD-10-CM | POA: Diagnosis not present

## 2015-10-18 DIAGNOSIS — C090 Malignant neoplasm of tonsillar fossa: Secondary | ICD-10-CM | POA: Diagnosis not present

## 2015-10-18 DIAGNOSIS — Z452 Encounter for adjustment and management of vascular access device: Secondary | ICD-10-CM | POA: Diagnosis not present

## 2015-10-18 DIAGNOSIS — I82401 Acute embolism and thrombosis of unspecified deep veins of right lower extremity: Secondary | ICD-10-CM | POA: Diagnosis not present

## 2015-10-18 DIAGNOSIS — R1310 Dysphagia, unspecified: Secondary | ICD-10-CM | POA: Diagnosis not present

## 2015-10-21 DIAGNOSIS — Z452 Encounter for adjustment and management of vascular access device: Secondary | ICD-10-CM | POA: Diagnosis not present

## 2015-10-21 DIAGNOSIS — K9422 Gastrostomy infection: Secondary | ICD-10-CM | POA: Diagnosis not present

## 2015-10-21 DIAGNOSIS — R1310 Dysphagia, unspecified: Secondary | ICD-10-CM | POA: Diagnosis not present

## 2015-10-21 DIAGNOSIS — C090 Malignant neoplasm of tonsillar fossa: Secondary | ICD-10-CM | POA: Diagnosis not present

## 2015-10-21 DIAGNOSIS — I82401 Acute embolism and thrombosis of unspecified deep veins of right lower extremity: Secondary | ICD-10-CM | POA: Diagnosis not present

## 2015-10-21 DIAGNOSIS — T2007XD Burn of unspecified degree of neck, subsequent encounter: Secondary | ICD-10-CM | POA: Diagnosis not present

## 2015-10-22 DIAGNOSIS — K9422 Gastrostomy infection: Secondary | ICD-10-CM | POA: Diagnosis not present

## 2015-10-22 DIAGNOSIS — T2007XD Burn of unspecified degree of neck, subsequent encounter: Secondary | ICD-10-CM | POA: Diagnosis not present

## 2015-10-22 DIAGNOSIS — R1310 Dysphagia, unspecified: Secondary | ICD-10-CM | POA: Diagnosis not present

## 2015-10-22 DIAGNOSIS — I82401 Acute embolism and thrombosis of unspecified deep veins of right lower extremity: Secondary | ICD-10-CM | POA: Diagnosis not present

## 2015-10-22 DIAGNOSIS — C090 Malignant neoplasm of tonsillar fossa: Secondary | ICD-10-CM | POA: Diagnosis not present

## 2015-10-22 DIAGNOSIS — Z452 Encounter for adjustment and management of vascular access device: Secondary | ICD-10-CM | POA: Diagnosis not present

## 2015-10-24 DIAGNOSIS — R1310 Dysphagia, unspecified: Secondary | ICD-10-CM | POA: Diagnosis not present

## 2015-10-24 DIAGNOSIS — C090 Malignant neoplasm of tonsillar fossa: Secondary | ICD-10-CM | POA: Diagnosis not present

## 2015-10-25 DIAGNOSIS — R1310 Dysphagia, unspecified: Secondary | ICD-10-CM | POA: Diagnosis not present

## 2015-10-25 DIAGNOSIS — I82401 Acute embolism and thrombosis of unspecified deep veins of right lower extremity: Secondary | ICD-10-CM | POA: Diagnosis not present

## 2015-10-25 DIAGNOSIS — Z452 Encounter for adjustment and management of vascular access device: Secondary | ICD-10-CM | POA: Diagnosis not present

## 2015-10-25 DIAGNOSIS — K9422 Gastrostomy infection: Secondary | ICD-10-CM | POA: Diagnosis not present

## 2015-10-25 DIAGNOSIS — C090 Malignant neoplasm of tonsillar fossa: Secondary | ICD-10-CM | POA: Diagnosis not present

## 2015-10-25 DIAGNOSIS — T2007XD Burn of unspecified degree of neck, subsequent encounter: Secondary | ICD-10-CM | POA: Diagnosis not present

## 2015-10-30 DIAGNOSIS — M87851 Other osteonecrosis, right femur: Secondary | ICD-10-CM | POA: Diagnosis not present

## 2015-10-30 DIAGNOSIS — M87852 Other osteonecrosis, left femur: Secondary | ICD-10-CM | POA: Diagnosis not present

## 2015-10-30 DIAGNOSIS — C099 Malignant neoplasm of tonsil, unspecified: Secondary | ICD-10-CM | POA: Diagnosis not present

## 2015-10-30 DIAGNOSIS — C090 Malignant neoplasm of tonsillar fossa: Secondary | ICD-10-CM | POA: Diagnosis not present

## 2015-10-31 DIAGNOSIS — M87851 Other osteonecrosis, right femur: Secondary | ICD-10-CM | POA: Diagnosis not present

## 2015-10-31 DIAGNOSIS — C090 Malignant neoplasm of tonsillar fossa: Secondary | ICD-10-CM | POA: Diagnosis not present

## 2015-10-31 DIAGNOSIS — M87852 Other osteonecrosis, left femur: Secondary | ICD-10-CM | POA: Diagnosis not present

## 2015-11-04 DIAGNOSIS — Z85818 Personal history of malignant neoplasm of other sites of lip, oral cavity, and pharynx: Secondary | ICD-10-CM | POA: Diagnosis not present

## 2015-11-04 DIAGNOSIS — I82409 Acute embolism and thrombosis of unspecified deep veins of unspecified lower extremity: Secondary | ICD-10-CM | POA: Diagnosis not present

## 2015-11-12 DIAGNOSIS — C090 Malignant neoplasm of tonsillar fossa: Secondary | ICD-10-CM | POA: Diagnosis not present

## 2015-11-15 DIAGNOSIS — C099 Malignant neoplasm of tonsil, unspecified: Secondary | ICD-10-CM | POA: Diagnosis not present

## 2015-11-15 DIAGNOSIS — Z77123 Contact with and (suspected) exposure to radon and other naturally occuring radiation: Secondary | ICD-10-CM | POA: Diagnosis not present

## 2015-11-15 DIAGNOSIS — K121 Other forms of stomatitis: Secondary | ICD-10-CM | POA: Diagnosis not present

## 2015-11-18 DIAGNOSIS — K121 Other forms of stomatitis: Secondary | ICD-10-CM | POA: Diagnosis not present

## 2015-11-18 DIAGNOSIS — C099 Malignant neoplasm of tonsil, unspecified: Secondary | ICD-10-CM | POA: Diagnosis not present

## 2015-11-20 DIAGNOSIS — Z85818 Personal history of malignant neoplasm of other sites of lip, oral cavity, and pharynx: Secondary | ICD-10-CM | POA: Diagnosis not present

## 2015-11-20 DIAGNOSIS — Z85819 Personal history of malignant neoplasm of unspecified site of lip, oral cavity, and pharynx: Secondary | ICD-10-CM | POA: Diagnosis not present

## 2015-11-20 DIAGNOSIS — D490 Neoplasm of unspecified behavior of digestive system: Secondary | ICD-10-CM | POA: Diagnosis not present

## 2015-11-20 DIAGNOSIS — R042 Hemoptysis: Secondary | ICD-10-CM | POA: Diagnosis not present

## 2015-11-20 DIAGNOSIS — Z79899 Other long term (current) drug therapy: Secondary | ICD-10-CM | POA: Diagnosis not present

## 2015-11-20 DIAGNOSIS — R58 Hemorrhage, not elsewhere classified: Secondary | ICD-10-CM | POA: Diagnosis not present

## 2015-11-20 DIAGNOSIS — C099 Malignant neoplasm of tonsil, unspecified: Secondary | ICD-10-CM | POA: Diagnosis not present

## 2015-11-20 DIAGNOSIS — R05 Cough: Secondary | ICD-10-CM | POA: Diagnosis not present

## 2015-11-20 DIAGNOSIS — I10 Essential (primary) hypertension: Secondary | ICD-10-CM | POA: Diagnosis not present

## 2015-11-20 DIAGNOSIS — J398 Other specified diseases of upper respiratory tract: Secondary | ICD-10-CM | POA: Diagnosis not present

## 2015-11-20 DIAGNOSIS — J392 Other diseases of pharynx: Secondary | ICD-10-CM | POA: Diagnosis not present

## 2015-11-21 DIAGNOSIS — L814 Other melanin hyperpigmentation: Secondary | ICD-10-CM | POA: Diagnosis not present

## 2015-11-21 DIAGNOSIS — L821 Other seborrheic keratosis: Secondary | ICD-10-CM | POA: Diagnosis not present

## 2015-11-26 DIAGNOSIS — R1310 Dysphagia, unspecified: Secondary | ICD-10-CM | POA: Diagnosis not present

## 2015-11-26 DIAGNOSIS — C090 Malignant neoplasm of tonsillar fossa: Secondary | ICD-10-CM | POA: Diagnosis not present

## 2015-12-03 DIAGNOSIS — C090 Malignant neoplasm of tonsillar fossa: Secondary | ICD-10-CM | POA: Diagnosis not present

## 2015-12-03 DIAGNOSIS — R1310 Dysphagia, unspecified: Secondary | ICD-10-CM | POA: Diagnosis not present

## 2015-12-04 DIAGNOSIS — D693 Immune thrombocytopenic purpura: Secondary | ICD-10-CM | POA: Diagnosis not present

## 2015-12-04 DIAGNOSIS — D649 Anemia, unspecified: Secondary | ICD-10-CM | POA: Diagnosis not present

## 2015-12-04 DIAGNOSIS — M79604 Pain in right leg: Secondary | ICD-10-CM | POA: Diagnosis not present

## 2015-12-04 DIAGNOSIS — C090 Malignant neoplasm of tonsillar fossa: Secondary | ICD-10-CM | POA: Diagnosis not present

## 2015-12-04 DIAGNOSIS — R600 Localized edema: Secondary | ICD-10-CM | POA: Diagnosis not present

## 2015-12-04 DIAGNOSIS — D696 Thrombocytopenia, unspecified: Secondary | ICD-10-CM | POA: Diagnosis not present

## 2015-12-10 DIAGNOSIS — C090 Malignant neoplasm of tonsillar fossa: Secondary | ICD-10-CM | POA: Diagnosis not present

## 2015-12-10 DIAGNOSIS — R1310 Dysphagia, unspecified: Secondary | ICD-10-CM | POA: Diagnosis not present

## 2015-12-17 DIAGNOSIS — R1310 Dysphagia, unspecified: Secondary | ICD-10-CM | POA: Diagnosis not present

## 2015-12-17 DIAGNOSIS — C090 Malignant neoplasm of tonsillar fossa: Secondary | ICD-10-CM | POA: Diagnosis not present

## 2015-12-28 DIAGNOSIS — C090 Malignant neoplasm of tonsillar fossa: Secondary | ICD-10-CM | POA: Diagnosis not present

## 2016-01-01 DIAGNOSIS — C090 Malignant neoplasm of tonsillar fossa: Secondary | ICD-10-CM | POA: Diagnosis not present

## 2016-01-16 DIAGNOSIS — M8788 Other osteonecrosis, other site: Secondary | ICD-10-CM | POA: Diagnosis not present

## 2016-01-16 DIAGNOSIS — C090 Malignant neoplasm of tonsillar fossa: Secondary | ICD-10-CM | POA: Diagnosis not present

## 2016-01-16 DIAGNOSIS — Z923 Personal history of irradiation: Secondary | ICD-10-CM | POA: Diagnosis not present

## 2016-01-16 DIAGNOSIS — Z87891 Personal history of nicotine dependence: Secondary | ICD-10-CM | POA: Diagnosis not present

## 2016-01-16 DIAGNOSIS — M278 Other specified diseases of jaws: Secondary | ICD-10-CM | POA: Diagnosis not present

## 2016-01-16 DIAGNOSIS — Z85819 Personal history of malignant neoplasm of unspecified site of lip, oral cavity, and pharynx: Secondary | ICD-10-CM | POA: Diagnosis not present

## 2016-01-16 DIAGNOSIS — M1990 Unspecified osteoarthritis, unspecified site: Secondary | ICD-10-CM | POA: Diagnosis not present

## 2016-01-23 DIAGNOSIS — I82501 Chronic embolism and thrombosis of unspecified deep veins of right lower extremity: Secondary | ICD-10-CM | POA: Diagnosis not present

## 2016-01-23 DIAGNOSIS — I82511 Chronic embolism and thrombosis of right femoral vein: Secondary | ICD-10-CM | POA: Diagnosis not present

## 2016-01-30 DIAGNOSIS — I10 Essential (primary) hypertension: Secondary | ICD-10-CM | POA: Diagnosis not present

## 2016-01-30 DIAGNOSIS — D696 Thrombocytopenia, unspecified: Secondary | ICD-10-CM | POA: Diagnosis not present

## 2016-01-30 DIAGNOSIS — T66XXXS Radiation sickness, unspecified, sequela: Secondary | ICD-10-CM | POA: Diagnosis not present

## 2016-01-30 DIAGNOSIS — Z87891 Personal history of nicotine dependence: Secondary | ICD-10-CM | POA: Diagnosis not present

## 2016-01-30 DIAGNOSIS — Z86718 Personal history of other venous thrombosis and embolism: Secondary | ICD-10-CM | POA: Diagnosis not present

## 2016-01-30 DIAGNOSIS — Z923 Personal history of irradiation: Secondary | ICD-10-CM | POA: Diagnosis not present

## 2016-01-30 DIAGNOSIS — M272 Inflammatory conditions of jaws: Secondary | ICD-10-CM | POA: Diagnosis not present

## 2016-01-30 DIAGNOSIS — E7800 Pure hypercholesterolemia, unspecified: Secondary | ICD-10-CM | POA: Diagnosis not present

## 2016-01-30 DIAGNOSIS — Z9081 Acquired absence of spleen: Secondary | ICD-10-CM | POA: Diagnosis not present

## 2016-01-30 DIAGNOSIS — C099 Malignant neoplasm of tonsil, unspecified: Secondary | ICD-10-CM | POA: Diagnosis not present

## 2016-02-03 DIAGNOSIS — M272 Inflammatory conditions of jaws: Secondary | ICD-10-CM | POA: Diagnosis not present

## 2016-02-03 DIAGNOSIS — L598 Other specified disorders of the skin and subcutaneous tissue related to radiation: Secondary | ICD-10-CM | POA: Diagnosis not present

## 2016-02-04 DIAGNOSIS — M272 Inflammatory conditions of jaws: Secondary | ICD-10-CM | POA: Diagnosis not present

## 2016-02-05 DIAGNOSIS — M272 Inflammatory conditions of jaws: Secondary | ICD-10-CM | POA: Diagnosis not present

## 2016-02-05 DIAGNOSIS — C090 Malignant neoplasm of tonsillar fossa: Secondary | ICD-10-CM | POA: Diagnosis not present

## 2016-02-06 DIAGNOSIS — M272 Inflammatory conditions of jaws: Secondary | ICD-10-CM | POA: Diagnosis not present

## 2016-02-07 DIAGNOSIS — M272 Inflammatory conditions of jaws: Secondary | ICD-10-CM | POA: Diagnosis not present

## 2016-02-10 DIAGNOSIS — C090 Malignant neoplasm of tonsillar fossa: Secondary | ICD-10-CM | POA: Diagnosis not present

## 2016-02-10 DIAGNOSIS — M278 Other specified diseases of jaws: Secondary | ICD-10-CM | POA: Diagnosis not present

## 2016-02-10 DIAGNOSIS — M272 Inflammatory conditions of jaws: Secondary | ICD-10-CM | POA: Diagnosis not present

## 2016-02-10 DIAGNOSIS — L598 Other specified disorders of the skin and subcutaneous tissue related to radiation: Secondary | ICD-10-CM | POA: Diagnosis not present

## 2016-02-11 DIAGNOSIS — M272 Inflammatory conditions of jaws: Secondary | ICD-10-CM | POA: Diagnosis not present

## 2016-02-11 DIAGNOSIS — L598 Other specified disorders of the skin and subcutaneous tissue related to radiation: Secondary | ICD-10-CM | POA: Diagnosis not present

## 2016-02-12 DIAGNOSIS — M272 Inflammatory conditions of jaws: Secondary | ICD-10-CM | POA: Diagnosis not present

## 2016-02-12 DIAGNOSIS — L598 Other specified disorders of the skin and subcutaneous tissue related to radiation: Secondary | ICD-10-CM | POA: Diagnosis not present

## 2016-02-13 DIAGNOSIS — L598 Other specified disorders of the skin and subcutaneous tissue related to radiation: Secondary | ICD-10-CM | POA: Diagnosis not present

## 2016-02-13 DIAGNOSIS — M272 Inflammatory conditions of jaws: Secondary | ICD-10-CM | POA: Diagnosis not present

## 2016-02-14 DIAGNOSIS — M278 Other specified diseases of jaws: Secondary | ICD-10-CM | POA: Diagnosis not present

## 2016-02-14 DIAGNOSIS — M272 Inflammatory conditions of jaws: Secondary | ICD-10-CM | POA: Diagnosis not present

## 2016-02-14 DIAGNOSIS — C090 Malignant neoplasm of tonsillar fossa: Secondary | ICD-10-CM | POA: Diagnosis not present

## 2016-02-17 DIAGNOSIS — C090 Malignant neoplasm of tonsillar fossa: Secondary | ICD-10-CM | POA: Diagnosis not present

## 2016-02-17 DIAGNOSIS — M278 Other specified diseases of jaws: Secondary | ICD-10-CM | POA: Diagnosis not present

## 2016-02-17 DIAGNOSIS — M272 Inflammatory conditions of jaws: Secondary | ICD-10-CM | POA: Diagnosis not present

## 2016-02-18 DIAGNOSIS — C090 Malignant neoplasm of tonsillar fossa: Secondary | ICD-10-CM | POA: Diagnosis not present

## 2016-02-18 DIAGNOSIS — M272 Inflammatory conditions of jaws: Secondary | ICD-10-CM | POA: Diagnosis not present

## 2016-02-18 DIAGNOSIS — M278 Other specified diseases of jaws: Secondary | ICD-10-CM | POA: Diagnosis not present

## 2016-02-19 DIAGNOSIS — C090 Malignant neoplasm of tonsillar fossa: Secondary | ICD-10-CM | POA: Diagnosis not present

## 2016-02-19 DIAGNOSIS — M278 Other specified diseases of jaws: Secondary | ICD-10-CM | POA: Diagnosis not present

## 2016-02-19 DIAGNOSIS — M272 Inflammatory conditions of jaws: Secondary | ICD-10-CM | POA: Diagnosis not present

## 2016-02-20 DIAGNOSIS — M278 Other specified diseases of jaws: Secondary | ICD-10-CM | POA: Diagnosis not present

## 2016-02-20 DIAGNOSIS — C090 Malignant neoplasm of tonsillar fossa: Secondary | ICD-10-CM | POA: Diagnosis not present

## 2016-02-20 DIAGNOSIS — M272 Inflammatory conditions of jaws: Secondary | ICD-10-CM | POA: Diagnosis not present

## 2016-02-21 DIAGNOSIS — C090 Malignant neoplasm of tonsillar fossa: Secondary | ICD-10-CM | POA: Diagnosis not present

## 2016-02-21 DIAGNOSIS — R220 Localized swelling, mass and lump, head: Secondary | ICD-10-CM | POA: Diagnosis not present

## 2016-02-21 DIAGNOSIS — M278 Other specified diseases of jaws: Secondary | ICD-10-CM | POA: Diagnosis not present

## 2016-02-24 DIAGNOSIS — M278 Other specified diseases of jaws: Secondary | ICD-10-CM | POA: Diagnosis not present

## 2016-02-24 DIAGNOSIS — L598 Other specified disorders of the skin and subcutaneous tissue related to radiation: Secondary | ICD-10-CM | POA: Diagnosis not present

## 2016-02-24 DIAGNOSIS — M272 Inflammatory conditions of jaws: Secondary | ICD-10-CM | POA: Diagnosis not present

## 2016-02-24 DIAGNOSIS — C090 Malignant neoplasm of tonsillar fossa: Secondary | ICD-10-CM | POA: Diagnosis not present

## 2016-02-25 DIAGNOSIS — M272 Inflammatory conditions of jaws: Secondary | ICD-10-CM | POA: Diagnosis not present

## 2016-02-26 DIAGNOSIS — M272 Inflammatory conditions of jaws: Secondary | ICD-10-CM | POA: Diagnosis not present

## 2016-02-26 DIAGNOSIS — R351 Nocturia: Secondary | ICD-10-CM | POA: Diagnosis not present

## 2016-02-26 DIAGNOSIS — Z79899 Other long term (current) drug therapy: Secondary | ICD-10-CM | POA: Diagnosis not present

## 2016-02-26 DIAGNOSIS — E785 Hyperlipidemia, unspecified: Secondary | ICD-10-CM | POA: Diagnosis not present

## 2016-02-26 DIAGNOSIS — I10 Essential (primary) hypertension: Secondary | ICD-10-CM | POA: Diagnosis not present

## 2016-02-26 DIAGNOSIS — Z0000 Encounter for general adult medical examination without abnormal findings: Secondary | ICD-10-CM | POA: Diagnosis not present

## 2016-02-26 DIAGNOSIS — Z125 Encounter for screening for malignant neoplasm of prostate: Secondary | ICD-10-CM | POA: Diagnosis not present

## 2016-02-26 DIAGNOSIS — R7309 Other abnormal glucose: Secondary | ICD-10-CM | POA: Diagnosis not present

## 2016-02-27 DIAGNOSIS — L598 Other specified disorders of the skin and subcutaneous tissue related to radiation: Secondary | ICD-10-CM | POA: Diagnosis not present

## 2016-02-27 DIAGNOSIS — C090 Malignant neoplasm of tonsillar fossa: Secondary | ICD-10-CM | POA: Diagnosis not present

## 2016-02-27 DIAGNOSIS — M272 Inflammatory conditions of jaws: Secondary | ICD-10-CM | POA: Diagnosis not present

## 2016-02-27 DIAGNOSIS — M278 Other specified diseases of jaws: Secondary | ICD-10-CM | POA: Diagnosis not present

## 2016-02-28 DIAGNOSIS — M272 Inflammatory conditions of jaws: Secondary | ICD-10-CM | POA: Diagnosis not present

## 2016-02-28 DIAGNOSIS — C090 Malignant neoplasm of tonsillar fossa: Secondary | ICD-10-CM | POA: Diagnosis not present

## 2016-02-28 DIAGNOSIS — M278 Other specified diseases of jaws: Secondary | ICD-10-CM | POA: Diagnosis not present

## 2016-03-02 DIAGNOSIS — M272 Inflammatory conditions of jaws: Secondary | ICD-10-CM | POA: Diagnosis not present

## 2016-03-03 DIAGNOSIS — M272 Inflammatory conditions of jaws: Secondary | ICD-10-CM | POA: Diagnosis not present

## 2016-03-04 DIAGNOSIS — I10 Essential (primary) hypertension: Secondary | ICD-10-CM | POA: Insufficient documentation

## 2016-03-04 DIAGNOSIS — Z79899 Other long term (current) drug therapy: Secondary | ICD-10-CM | POA: Diagnosis not present

## 2016-03-04 DIAGNOSIS — Y842 Radiological procedure and radiotherapy as the cause of abnormal reaction of the patient, or of later complication, without mention of misadventure at the time of the procedure: Secondary | ICD-10-CM

## 2016-03-04 DIAGNOSIS — Z86718 Personal history of other venous thrombosis and embolism: Secondary | ICD-10-CM

## 2016-03-04 DIAGNOSIS — Z923 Personal history of irradiation: Secondary | ICD-10-CM | POA: Diagnosis not present

## 2016-03-04 DIAGNOSIS — I890 Lymphedema, not elsewhere classified: Secondary | ICD-10-CM | POA: Diagnosis not present

## 2016-03-04 DIAGNOSIS — E7800 Pure hypercholesterolemia, unspecified: Secondary | ICD-10-CM

## 2016-03-04 DIAGNOSIS — G4730 Sleep apnea, unspecified: Secondary | ICD-10-CM | POA: Insufficient documentation

## 2016-03-04 DIAGNOSIS — Z87891 Personal history of nicotine dependence: Secondary | ICD-10-CM | POA: Diagnosis not present

## 2016-03-04 DIAGNOSIS — C099 Malignant neoplasm of tonsil, unspecified: Secondary | ICD-10-CM | POA: Diagnosis not present

## 2016-03-04 DIAGNOSIS — Z01812 Encounter for preprocedural laboratory examination: Secondary | ICD-10-CM | POA: Diagnosis not present

## 2016-03-04 DIAGNOSIS — Z862 Personal history of diseases of the blood and blood-forming organs and certain disorders involving the immune mechanism: Secondary | ICD-10-CM | POA: Insufficient documentation

## 2016-03-04 DIAGNOSIS — M272 Inflammatory conditions of jaws: Secondary | ICD-10-CM | POA: Diagnosis not present

## 2016-03-04 HISTORY — DX: Personal history of other venous thrombosis and embolism: Z86.718

## 2016-03-04 HISTORY — DX: Personal history of diseases of the blood and blood-forming organs and certain disorders involving the immune mechanism: Z86.2

## 2016-03-04 HISTORY — DX: Pure hypercholesterolemia, unspecified: E78.00

## 2016-03-04 HISTORY — DX: Sleep apnea, unspecified: G47.30

## 2016-03-04 HISTORY — DX: Radiological procedure and radiotherapy as the cause of abnormal reaction of the patient, or of later complication, without mention of misadventure at the time of the procedure: Y84.2

## 2016-03-04 HISTORY — DX: Essential (primary) hypertension: I10

## 2016-03-04 HISTORY — DX: Inflammatory conditions of jaws: M27.2

## 2016-03-05 DIAGNOSIS — C090 Malignant neoplasm of tonsillar fossa: Secondary | ICD-10-CM | POA: Diagnosis not present

## 2016-03-05 DIAGNOSIS — E039 Hypothyroidism, unspecified: Secondary | ICD-10-CM | POA: Diagnosis not present

## 2016-03-09 DIAGNOSIS — E7800 Pure hypercholesterolemia, unspecified: Secondary | ICD-10-CM | POA: Diagnosis not present

## 2016-03-09 DIAGNOSIS — Z6841 Body Mass Index (BMI) 40.0 and over, adult: Secondary | ICD-10-CM | POA: Diagnosis not present

## 2016-03-09 DIAGNOSIS — D693 Immune thrombocytopenic purpura: Secondary | ICD-10-CM | POA: Diagnosis not present

## 2016-03-09 DIAGNOSIS — I10 Essential (primary) hypertension: Secondary | ICD-10-CM | POA: Diagnosis not present

## 2016-03-09 DIAGNOSIS — Z86718 Personal history of other venous thrombosis and embolism: Secondary | ICD-10-CM | POA: Diagnosis not present

## 2016-03-09 DIAGNOSIS — Z85818 Personal history of malignant neoplasm of other sites of lip, oral cavity, and pharynx: Secondary | ICD-10-CM | POA: Diagnosis not present

## 2016-03-09 DIAGNOSIS — Z923 Personal history of irradiation: Secondary | ICD-10-CM | POA: Diagnosis not present

## 2016-03-09 DIAGNOSIS — Z79899 Other long term (current) drug therapy: Secondary | ICD-10-CM | POA: Diagnosis not present

## 2016-03-09 DIAGNOSIS — K140 Glossitis: Secondary | ICD-10-CM | POA: Diagnosis not present

## 2016-03-09 DIAGNOSIS — M272 Inflammatory conditions of jaws: Secondary | ICD-10-CM | POA: Diagnosis not present

## 2016-03-09 DIAGNOSIS — Z87891 Personal history of nicotine dependence: Secondary | ICD-10-CM | POA: Diagnosis not present

## 2016-03-09 DIAGNOSIS — Z9081 Acquired absence of spleen: Secondary | ICD-10-CM | POA: Diagnosis not present

## 2016-03-11 DIAGNOSIS — L598 Other specified disorders of the skin and subcutaneous tissue related to radiation: Secondary | ICD-10-CM | POA: Diagnosis not present

## 2016-03-11 DIAGNOSIS — M272 Inflammatory conditions of jaws: Secondary | ICD-10-CM | POA: Diagnosis not present

## 2016-03-12 DIAGNOSIS — M272 Inflammatory conditions of jaws: Secondary | ICD-10-CM | POA: Diagnosis not present

## 2016-03-12 DIAGNOSIS — M278 Other specified diseases of jaws: Secondary | ICD-10-CM | POA: Diagnosis not present

## 2016-03-12 DIAGNOSIS — L598 Other specified disorders of the skin and subcutaneous tissue related to radiation: Secondary | ICD-10-CM | POA: Diagnosis not present

## 2016-03-12 DIAGNOSIS — C090 Malignant neoplasm of tonsillar fossa: Secondary | ICD-10-CM | POA: Diagnosis not present

## 2016-03-13 DIAGNOSIS — M272 Inflammatory conditions of jaws: Secondary | ICD-10-CM | POA: Diagnosis not present

## 2016-03-13 DIAGNOSIS — L598 Other specified disorders of the skin and subcutaneous tissue related to radiation: Secondary | ICD-10-CM | POA: Diagnosis not present

## 2016-03-16 DIAGNOSIS — M272 Inflammatory conditions of jaws: Secondary | ICD-10-CM | POA: Diagnosis not present

## 2016-03-16 DIAGNOSIS — L598 Other specified disorders of the skin and subcutaneous tissue related to radiation: Secondary | ICD-10-CM | POA: Diagnosis not present

## 2016-03-16 DIAGNOSIS — C090 Malignant neoplasm of tonsillar fossa: Secondary | ICD-10-CM | POA: Diagnosis not present

## 2016-03-17 DIAGNOSIS — M272 Inflammatory conditions of jaws: Secondary | ICD-10-CM | POA: Diagnosis not present

## 2016-03-17 DIAGNOSIS — C090 Malignant neoplasm of tonsillar fossa: Secondary | ICD-10-CM | POA: Diagnosis not present

## 2016-03-18 DIAGNOSIS — M272 Inflammatory conditions of jaws: Secondary | ICD-10-CM | POA: Diagnosis not present

## 2016-03-18 DIAGNOSIS — L598 Other specified disorders of the skin and subcutaneous tissue related to radiation: Secondary | ICD-10-CM | POA: Diagnosis not present

## 2016-03-18 DIAGNOSIS — C090 Malignant neoplasm of tonsillar fossa: Secondary | ICD-10-CM | POA: Diagnosis not present

## 2016-03-19 DIAGNOSIS — I10 Essential (primary) hypertension: Secondary | ICD-10-CM | POA: Diagnosis not present

## 2016-03-19 DIAGNOSIS — T66XXXS Radiation sickness, unspecified, sequela: Secondary | ICD-10-CM | POA: Diagnosis not present

## 2016-03-19 DIAGNOSIS — I890 Lymphedema, not elsewhere classified: Secondary | ICD-10-CM | POA: Diagnosis not present

## 2016-03-19 DIAGNOSIS — E7800 Pure hypercholesterolemia, unspecified: Secondary | ICD-10-CM | POA: Diagnosis not present

## 2016-03-19 DIAGNOSIS — Z79899 Other long term (current) drug therapy: Secondary | ICD-10-CM | POA: Diagnosis not present

## 2016-03-19 DIAGNOSIS — C099 Malignant neoplasm of tonsil, unspecified: Secondary | ICD-10-CM | POA: Diagnosis not present

## 2016-03-19 DIAGNOSIS — Z4889 Encounter for other specified surgical aftercare: Secondary | ICD-10-CM | POA: Diagnosis not present

## 2016-03-19 DIAGNOSIS — Z87891 Personal history of nicotine dependence: Secondary | ICD-10-CM | POA: Diagnosis not present

## 2016-03-19 DIAGNOSIS — L598 Other specified disorders of the skin and subcutaneous tissue related to radiation: Secondary | ICD-10-CM | POA: Diagnosis not present

## 2016-03-19 DIAGNOSIS — C090 Malignant neoplasm of tonsillar fossa: Secondary | ICD-10-CM | POA: Diagnosis not present

## 2016-03-19 DIAGNOSIS — M272 Inflammatory conditions of jaws: Secondary | ICD-10-CM | POA: Diagnosis not present

## 2016-03-20 DIAGNOSIS — L598 Other specified disorders of the skin and subcutaneous tissue related to radiation: Secondary | ICD-10-CM | POA: Diagnosis not present

## 2016-03-20 DIAGNOSIS — M278 Other specified diseases of jaws: Secondary | ICD-10-CM | POA: Diagnosis not present

## 2016-03-20 DIAGNOSIS — Z872 Personal history of diseases of the skin and subcutaneous tissue: Secondary | ICD-10-CM | POA: Diagnosis not present

## 2016-03-20 DIAGNOSIS — M272 Inflammatory conditions of jaws: Secondary | ICD-10-CM | POA: Diagnosis not present

## 2016-03-20 DIAGNOSIS — Z09 Encounter for follow-up examination after completed treatment for conditions other than malignant neoplasm: Secondary | ICD-10-CM | POA: Diagnosis not present

## 2016-03-20 DIAGNOSIS — C090 Malignant neoplasm of tonsillar fossa: Secondary | ICD-10-CM | POA: Diagnosis not present

## 2016-03-24 DIAGNOSIS — Z1389 Encounter for screening for other disorder: Secondary | ICD-10-CM | POA: Diagnosis not present

## 2016-03-24 DIAGNOSIS — Z79899 Other long term (current) drug therapy: Secondary | ICD-10-CM | POA: Diagnosis not present

## 2016-03-24 DIAGNOSIS — E038 Other specified hypothyroidism: Secondary | ICD-10-CM | POA: Diagnosis not present

## 2016-04-02 DIAGNOSIS — C099 Malignant neoplasm of tonsil, unspecified: Secondary | ICD-10-CM | POA: Diagnosis not present

## 2016-04-02 DIAGNOSIS — I890 Lymphedema, not elsewhere classified: Secondary | ICD-10-CM | POA: Diagnosis not present

## 2016-04-02 DIAGNOSIS — E7800 Pure hypercholesterolemia, unspecified: Secondary | ICD-10-CM | POA: Diagnosis not present

## 2016-04-02 DIAGNOSIS — Z9089 Acquired absence of other organs: Secondary | ICD-10-CM | POA: Diagnosis not present

## 2016-04-02 DIAGNOSIS — Z87891 Personal history of nicotine dependence: Secondary | ICD-10-CM | POA: Diagnosis not present

## 2016-04-02 DIAGNOSIS — Z923 Personal history of irradiation: Secondary | ICD-10-CM | POA: Diagnosis not present

## 2016-04-02 DIAGNOSIS — I10 Essential (primary) hypertension: Secondary | ICD-10-CM | POA: Diagnosis not present

## 2016-04-16 DIAGNOSIS — Z923 Personal history of irradiation: Secondary | ICD-10-CM | POA: Diagnosis not present

## 2016-04-16 DIAGNOSIS — T66XXXS Radiation sickness, unspecified, sequela: Secondary | ICD-10-CM | POA: Diagnosis not present

## 2016-04-16 DIAGNOSIS — Z87891 Personal history of nicotine dependence: Secondary | ICD-10-CM | POA: Diagnosis not present

## 2016-04-16 DIAGNOSIS — M272 Inflammatory conditions of jaws: Secondary | ICD-10-CM | POA: Diagnosis not present

## 2016-04-16 DIAGNOSIS — C099 Malignant neoplasm of tonsil, unspecified: Secondary | ICD-10-CM | POA: Diagnosis not present

## 2016-04-16 DIAGNOSIS — Z9889 Other specified postprocedural states: Secondary | ICD-10-CM | POA: Diagnosis not present

## 2016-04-16 DIAGNOSIS — I890 Lymphedema, not elsewhere classified: Secondary | ICD-10-CM | POA: Diagnosis not present

## 2016-04-16 DIAGNOSIS — Z86718 Personal history of other venous thrombosis and embolism: Secondary | ICD-10-CM | POA: Diagnosis not present

## 2016-04-22 DIAGNOSIS — C090 Malignant neoplasm of tonsillar fossa: Secondary | ICD-10-CM | POA: Diagnosis not present

## 2016-05-06 DIAGNOSIS — E7800 Pure hypercholesterolemia, unspecified: Secondary | ICD-10-CM | POA: Diagnosis not present

## 2016-05-06 DIAGNOSIS — M272 Inflammatory conditions of jaws: Secondary | ICD-10-CM | POA: Diagnosis not present

## 2016-05-06 DIAGNOSIS — Z923 Personal history of irradiation: Secondary | ICD-10-CM | POA: Diagnosis not present

## 2016-05-06 DIAGNOSIS — I890 Lymphedema, not elsewhere classified: Secondary | ICD-10-CM | POA: Diagnosis not present

## 2016-05-06 DIAGNOSIS — Z87891 Personal history of nicotine dependence: Secondary | ICD-10-CM | POA: Diagnosis not present

## 2016-05-06 DIAGNOSIS — C099 Malignant neoplasm of tonsil, unspecified: Secondary | ICD-10-CM | POA: Diagnosis not present

## 2016-05-06 DIAGNOSIS — I10 Essential (primary) hypertension: Secondary | ICD-10-CM | POA: Diagnosis not present

## 2016-05-06 DIAGNOSIS — Z79899 Other long term (current) drug therapy: Secondary | ICD-10-CM | POA: Diagnosis not present

## 2016-05-06 DIAGNOSIS — Z86718 Personal history of other venous thrombosis and embolism: Secondary | ICD-10-CM | POA: Diagnosis not present

## 2016-06-05 DIAGNOSIS — K140 Glossitis: Secondary | ICD-10-CM | POA: Diagnosis not present

## 2016-06-05 DIAGNOSIS — I10 Essential (primary) hypertension: Secondary | ICD-10-CM | POA: Diagnosis not present

## 2016-06-05 DIAGNOSIS — L598 Other specified disorders of the skin and subcutaneous tissue related to radiation: Secondary | ICD-10-CM | POA: Diagnosis not present

## 2016-06-05 DIAGNOSIS — M272 Inflammatory conditions of jaws: Secondary | ICD-10-CM | POA: Diagnosis not present

## 2016-06-05 DIAGNOSIS — C090 Malignant neoplasm of tonsillar fossa: Secondary | ICD-10-CM | POA: Diagnosis not present

## 2016-06-05 DIAGNOSIS — T8189XA Other complications of procedures, not elsewhere classified, initial encounter: Secondary | ICD-10-CM | POA: Diagnosis not present

## 2016-06-05 DIAGNOSIS — Z86718 Personal history of other venous thrombosis and embolism: Secondary | ICD-10-CM | POA: Diagnosis not present

## 2016-06-17 DIAGNOSIS — M272 Inflammatory conditions of jaws: Secondary | ICD-10-CM | POA: Diagnosis not present

## 2016-06-17 DIAGNOSIS — L598 Other specified disorders of the skin and subcutaneous tissue related to radiation: Secondary | ICD-10-CM | POA: Diagnosis not present

## 2016-06-18 DIAGNOSIS — L598 Other specified disorders of the skin and subcutaneous tissue related to radiation: Secondary | ICD-10-CM | POA: Diagnosis not present

## 2016-06-18 DIAGNOSIS — M272 Inflammatory conditions of jaws: Secondary | ICD-10-CM | POA: Diagnosis not present

## 2016-06-19 DIAGNOSIS — L598 Other specified disorders of the skin and subcutaneous tissue related to radiation: Secondary | ICD-10-CM | POA: Diagnosis not present

## 2016-06-19 DIAGNOSIS — M272 Inflammatory conditions of jaws: Secondary | ICD-10-CM | POA: Diagnosis not present

## 2016-06-22 DIAGNOSIS — M272 Inflammatory conditions of jaws: Secondary | ICD-10-CM | POA: Diagnosis not present

## 2016-06-22 DIAGNOSIS — L598 Other specified disorders of the skin and subcutaneous tissue related to radiation: Secondary | ICD-10-CM | POA: Diagnosis not present

## 2016-06-23 DIAGNOSIS — L598 Other specified disorders of the skin and subcutaneous tissue related to radiation: Secondary | ICD-10-CM | POA: Diagnosis not present

## 2016-06-23 DIAGNOSIS — M272 Inflammatory conditions of jaws: Secondary | ICD-10-CM | POA: Diagnosis not present

## 2016-06-24 DIAGNOSIS — Z79899 Other long term (current) drug therapy: Secondary | ICD-10-CM | POA: Diagnosis not present

## 2016-06-24 DIAGNOSIS — L723 Sebaceous cyst: Secondary | ICD-10-CM | POA: Diagnosis not present

## 2016-06-24 DIAGNOSIS — E038 Other specified hypothyroidism: Secondary | ICD-10-CM | POA: Diagnosis not present

## 2016-06-25 DIAGNOSIS — M272 Inflammatory conditions of jaws: Secondary | ICD-10-CM | POA: Diagnosis not present

## 2016-06-25 DIAGNOSIS — C090 Malignant neoplasm of tonsillar fossa: Secondary | ICD-10-CM | POA: Diagnosis not present

## 2016-06-25 DIAGNOSIS — L598 Other specified disorders of the skin and subcutaneous tissue related to radiation: Secondary | ICD-10-CM | POA: Diagnosis not present

## 2016-06-25 DIAGNOSIS — L98494 Non-pressure chronic ulcer of skin of other sites with necrosis of bone: Secondary | ICD-10-CM | POA: Diagnosis not present

## 2016-06-26 DIAGNOSIS — L598 Other specified disorders of the skin and subcutaneous tissue related to radiation: Secondary | ICD-10-CM | POA: Diagnosis not present

## 2016-06-26 DIAGNOSIS — M272 Inflammatory conditions of jaws: Secondary | ICD-10-CM | POA: Diagnosis not present

## 2016-06-29 DIAGNOSIS — L598 Other specified disorders of the skin and subcutaneous tissue related to radiation: Secondary | ICD-10-CM | POA: Diagnosis not present

## 2016-06-29 DIAGNOSIS — M272 Inflammatory conditions of jaws: Secondary | ICD-10-CM | POA: Diagnosis not present

## 2016-06-29 DIAGNOSIS — Z48817 Encounter for surgical aftercare following surgery on the skin and subcutaneous tissue: Secondary | ICD-10-CM | POA: Diagnosis not present

## 2016-06-29 DIAGNOSIS — S01502A Unspecified open wound of oral cavity, initial encounter: Secondary | ICD-10-CM | POA: Diagnosis not present

## 2016-06-30 DIAGNOSIS — L598 Other specified disorders of the skin and subcutaneous tissue related to radiation: Secondary | ICD-10-CM | POA: Diagnosis not present

## 2016-06-30 DIAGNOSIS — M272 Inflammatory conditions of jaws: Secondary | ICD-10-CM | POA: Diagnosis not present

## 2016-06-30 DIAGNOSIS — Z48817 Encounter for surgical aftercare following surgery on the skin and subcutaneous tissue: Secondary | ICD-10-CM | POA: Diagnosis not present

## 2016-06-30 DIAGNOSIS — S01502A Unspecified open wound of oral cavity, initial encounter: Secondary | ICD-10-CM | POA: Diagnosis not present

## 2016-07-01 DIAGNOSIS — M272 Inflammatory conditions of jaws: Secondary | ICD-10-CM | POA: Diagnosis not present

## 2016-07-01 DIAGNOSIS — L598 Other specified disorders of the skin and subcutaneous tissue related to radiation: Secondary | ICD-10-CM | POA: Diagnosis not present

## 2016-07-06 DIAGNOSIS — E032 Hypothyroidism due to medicaments and other exogenous substances: Secondary | ICD-10-CM | POA: Diagnosis not present

## 2016-07-06 DIAGNOSIS — Z8579 Personal history of other malignant neoplasms of lymphoid, hematopoietic and related tissues: Secondary | ICD-10-CM | POA: Diagnosis not present

## 2016-07-06 DIAGNOSIS — C090 Malignant neoplasm of tonsillar fossa: Secondary | ICD-10-CM | POA: Diagnosis not present

## 2016-07-22 DIAGNOSIS — Z87891 Personal history of nicotine dependence: Secondary | ICD-10-CM | POA: Diagnosis not present

## 2016-07-22 DIAGNOSIS — C099 Malignant neoplasm of tonsil, unspecified: Secondary | ICD-10-CM | POA: Diagnosis not present

## 2016-07-22 DIAGNOSIS — I890 Lymphedema, not elsewhere classified: Secondary | ICD-10-CM | POA: Diagnosis not present

## 2016-07-22 DIAGNOSIS — Z9089 Acquired absence of other organs: Secondary | ICD-10-CM | POA: Diagnosis not present

## 2016-07-22 DIAGNOSIS — E7800 Pure hypercholesterolemia, unspecified: Secondary | ICD-10-CM | POA: Diagnosis not present

## 2016-07-22 DIAGNOSIS — T66XXXS Radiation sickness, unspecified, sequela: Secondary | ICD-10-CM | POA: Diagnosis not present

## 2016-07-22 DIAGNOSIS — Z85818 Personal history of malignant neoplasm of other sites of lip, oral cavity, and pharynx: Secondary | ICD-10-CM | POA: Diagnosis not present

## 2016-07-22 DIAGNOSIS — I10 Essential (primary) hypertension: Secondary | ICD-10-CM | POA: Diagnosis not present

## 2016-07-22 DIAGNOSIS — Z9889 Other specified postprocedural states: Secondary | ICD-10-CM | POA: Diagnosis not present

## 2016-07-22 DIAGNOSIS — C7989 Secondary malignant neoplasm of other specified sites: Secondary | ICD-10-CM | POA: Diagnosis not present

## 2016-07-22 DIAGNOSIS — M272 Inflammatory conditions of jaws: Secondary | ICD-10-CM | POA: Diagnosis not present

## 2016-08-17 DIAGNOSIS — Z85818 Personal history of malignant neoplasm of other sites of lip, oral cavity, and pharynx: Secondary | ICD-10-CM | POA: Diagnosis not present

## 2016-08-25 DIAGNOSIS — C099 Malignant neoplasm of tonsil, unspecified: Secondary | ICD-10-CM | POA: Diagnosis not present

## 2016-08-25 DIAGNOSIS — C090 Malignant neoplasm of tonsillar fossa: Secondary | ICD-10-CM | POA: Diagnosis not present

## 2016-10-21 DIAGNOSIS — Z923 Personal history of irradiation: Secondary | ICD-10-CM | POA: Diagnosis not present

## 2016-10-21 DIAGNOSIS — I890 Lymphedema, not elsewhere classified: Secondary | ICD-10-CM | POA: Diagnosis not present

## 2016-10-21 DIAGNOSIS — R1310 Dysphagia, unspecified: Secondary | ICD-10-CM | POA: Diagnosis not present

## 2016-10-21 DIAGNOSIS — R1312 Dysphagia, oropharyngeal phase: Secondary | ICD-10-CM

## 2016-10-21 DIAGNOSIS — Z87891 Personal history of nicotine dependence: Secondary | ICD-10-CM | POA: Diagnosis not present

## 2016-10-21 DIAGNOSIS — M272 Inflammatory conditions of jaws: Secondary | ICD-10-CM | POA: Diagnosis not present

## 2016-10-21 DIAGNOSIS — K121 Other forms of stomatitis: Secondary | ICD-10-CM | POA: Diagnosis not present

## 2016-10-21 DIAGNOSIS — C099 Malignant neoplasm of tonsil, unspecified: Secondary | ICD-10-CM | POA: Diagnosis not present

## 2016-10-21 HISTORY — DX: Dysphagia, oropharyngeal phase: R13.12

## 2016-11-03 DIAGNOSIS — Z85818 Personal history of malignant neoplasm of other sites of lip, oral cavity, and pharynx: Secondary | ICD-10-CM | POA: Diagnosis not present

## 2016-11-03 DIAGNOSIS — Z8579 Personal history of other malignant neoplasms of lymphoid, hematopoietic and related tissues: Secondary | ICD-10-CM | POA: Diagnosis not present

## 2016-11-03 DIAGNOSIS — E039 Hypothyroidism, unspecified: Secondary | ICD-10-CM | POA: Diagnosis not present

## 2016-11-27 DIAGNOSIS — Z1211 Encounter for screening for malignant neoplasm of colon: Secondary | ICD-10-CM | POA: Diagnosis not present

## 2016-11-27 DIAGNOSIS — Z86010 Personal history of colonic polyps: Secondary | ICD-10-CM | POA: Diagnosis not present

## 2016-12-16 DIAGNOSIS — Z85818 Personal history of malignant neoplasm of other sites of lip, oral cavity, and pharynx: Secondary | ICD-10-CM | POA: Diagnosis not present

## 2017-01-20 DIAGNOSIS — I10 Essential (primary) hypertension: Secondary | ICD-10-CM | POA: Diagnosis not present

## 2017-01-20 DIAGNOSIS — C099 Malignant neoplasm of tonsil, unspecified: Secondary | ICD-10-CM | POA: Diagnosis not present

## 2017-01-20 DIAGNOSIS — Z87891 Personal history of nicotine dependence: Secondary | ICD-10-CM | POA: Diagnosis not present

## 2017-01-20 DIAGNOSIS — G4730 Sleep apnea, unspecified: Secondary | ICD-10-CM | POA: Diagnosis not present

## 2017-01-20 DIAGNOSIS — E7800 Pure hypercholesterolemia, unspecified: Secondary | ICD-10-CM | POA: Diagnosis not present

## 2017-01-20 DIAGNOSIS — R1310 Dysphagia, unspecified: Secondary | ICD-10-CM | POA: Diagnosis not present

## 2017-01-20 DIAGNOSIS — Z923 Personal history of irradiation: Secondary | ICD-10-CM | POA: Diagnosis not present

## 2017-01-20 DIAGNOSIS — Z85818 Personal history of malignant neoplasm of other sites of lip, oral cavity, and pharynx: Secondary | ICD-10-CM | POA: Diagnosis not present

## 2017-01-20 DIAGNOSIS — I890 Lymphedema, not elsewhere classified: Secondary | ICD-10-CM | POA: Diagnosis not present

## 2017-01-20 DIAGNOSIS — M272 Inflammatory conditions of jaws: Secondary | ICD-10-CM | POA: Diagnosis not present

## 2017-01-20 DIAGNOSIS — R1312 Dysphagia, oropharyngeal phase: Secondary | ICD-10-CM | POA: Diagnosis not present

## 2017-01-27 DIAGNOSIS — N186 End stage renal disease: Secondary | ICD-10-CM | POA: Diagnosis not present

## 2017-01-27 DIAGNOSIS — Z1211 Encounter for screening for malignant neoplasm of colon: Secondary | ICD-10-CM | POA: Diagnosis not present

## 2017-01-27 DIAGNOSIS — F329 Major depressive disorder, single episode, unspecified: Secondary | ICD-10-CM | POA: Diagnosis not present

## 2017-01-27 DIAGNOSIS — K648 Other hemorrhoids: Secondary | ICD-10-CM | POA: Diagnosis not present

## 2017-01-27 DIAGNOSIS — D128 Benign neoplasm of rectum: Secondary | ICD-10-CM | POA: Diagnosis not present

## 2017-01-27 DIAGNOSIS — Z992 Dependence on renal dialysis: Secondary | ICD-10-CM | POA: Diagnosis not present

## 2017-01-27 DIAGNOSIS — E785 Hyperlipidemia, unspecified: Secondary | ICD-10-CM | POA: Diagnosis not present

## 2017-01-27 DIAGNOSIS — Z86010 Personal history of colonic polyps: Secondary | ICD-10-CM | POA: Diagnosis not present

## 2017-01-27 DIAGNOSIS — K5730 Diverticulosis of large intestine without perforation or abscess without bleeding: Secondary | ICD-10-CM | POA: Diagnosis not present

## 2017-01-27 DIAGNOSIS — G4730 Sleep apnea, unspecified: Secondary | ICD-10-CM | POA: Diagnosis not present

## 2017-01-27 DIAGNOSIS — Z79899 Other long term (current) drug therapy: Secondary | ICD-10-CM | POA: Diagnosis not present

## 2017-01-27 DIAGNOSIS — Z87891 Personal history of nicotine dependence: Secondary | ICD-10-CM | POA: Diagnosis not present

## 2017-01-27 DIAGNOSIS — K621 Rectal polyp: Secondary | ICD-10-CM | POA: Diagnosis not present

## 2017-01-27 DIAGNOSIS — I120 Hypertensive chronic kidney disease with stage 5 chronic kidney disease or end stage renal disease: Secondary | ICD-10-CM | POA: Diagnosis not present

## 2017-02-08 DIAGNOSIS — R1310 Dysphagia, unspecified: Secondary | ICD-10-CM | POA: Diagnosis not present

## 2017-02-08 DIAGNOSIS — R633 Feeding difficulties: Secondary | ICD-10-CM | POA: Diagnosis not present

## 2017-02-18 DIAGNOSIS — R1313 Dysphagia, pharyngeal phase: Secondary | ICD-10-CM | POA: Diagnosis not present

## 2017-02-18 DIAGNOSIS — R1312 Dysphagia, oropharyngeal phase: Secondary | ICD-10-CM | POA: Diagnosis not present

## 2017-02-19 DIAGNOSIS — Z23 Encounter for immunization: Secondary | ICD-10-CM | POA: Diagnosis not present

## 2017-03-05 DIAGNOSIS — Z923 Personal history of irradiation: Secondary | ICD-10-CM | POA: Diagnosis not present

## 2017-03-05 DIAGNOSIS — Z85818 Personal history of malignant neoplasm of other sites of lip, oral cavity, and pharynx: Secondary | ICD-10-CM | POA: Diagnosis not present

## 2017-03-05 DIAGNOSIS — Z8579 Personal history of other malignant neoplasms of lymphoid, hematopoietic and related tissues: Secondary | ICD-10-CM | POA: Diagnosis not present

## 2017-03-05 DIAGNOSIS — R946 Abnormal results of thyroid function studies: Secondary | ICD-10-CM | POA: Diagnosis not present

## 2017-03-16 DIAGNOSIS — M272 Inflammatory conditions of jaws: Secondary | ICD-10-CM | POA: Diagnosis not present

## 2017-03-16 DIAGNOSIS — C099 Malignant neoplasm of tonsil, unspecified: Secondary | ICD-10-CM | POA: Diagnosis not present

## 2017-03-19 DIAGNOSIS — Z0000 Encounter for general adult medical examination without abnormal findings: Secondary | ICD-10-CM | POA: Diagnosis not present

## 2017-03-19 DIAGNOSIS — Z79899 Other long term (current) drug therapy: Secondary | ICD-10-CM | POA: Diagnosis not present

## 2017-03-19 DIAGNOSIS — E785 Hyperlipidemia, unspecified: Secondary | ICD-10-CM | POA: Diagnosis not present

## 2017-03-19 DIAGNOSIS — Z1331 Encounter for screening for depression: Secondary | ICD-10-CM | POA: Diagnosis not present

## 2017-03-19 DIAGNOSIS — E039 Hypothyroidism, unspecified: Secondary | ICD-10-CM | POA: Diagnosis not present

## 2017-03-19 DIAGNOSIS — N529 Male erectile dysfunction, unspecified: Secondary | ICD-10-CM | POA: Diagnosis not present

## 2017-03-19 DIAGNOSIS — Z125 Encounter for screening for malignant neoplasm of prostate: Secondary | ICD-10-CM | POA: Diagnosis not present

## 2017-04-21 DIAGNOSIS — I890 Lymphedema, not elsewhere classified: Secondary | ICD-10-CM | POA: Diagnosis not present

## 2017-04-21 DIAGNOSIS — Y842 Radiological procedure and radiotherapy as the cause of abnormal reaction of the patient, or of later complication, without mention of misadventure at the time of the procedure: Secondary | ICD-10-CM | POA: Diagnosis not present

## 2017-04-21 DIAGNOSIS — C099 Malignant neoplasm of tonsil, unspecified: Secondary | ICD-10-CM | POA: Diagnosis not present

## 2017-04-21 DIAGNOSIS — Z98818 Other dental procedure status: Secondary | ICD-10-CM | POA: Diagnosis not present

## 2017-04-21 DIAGNOSIS — M272 Inflammatory conditions of jaws: Secondary | ICD-10-CM | POA: Diagnosis not present

## 2017-04-21 DIAGNOSIS — Z86718 Personal history of other venous thrombosis and embolism: Secondary | ICD-10-CM | POA: Diagnosis not present

## 2017-04-21 DIAGNOSIS — Z9889 Other specified postprocedural states: Secondary | ICD-10-CM | POA: Diagnosis not present

## 2017-04-21 DIAGNOSIS — Z923 Personal history of irradiation: Secondary | ICD-10-CM | POA: Diagnosis not present

## 2017-04-21 DIAGNOSIS — Z79899 Other long term (current) drug therapy: Secondary | ICD-10-CM | POA: Diagnosis not present

## 2017-04-21 DIAGNOSIS — I10 Essential (primary) hypertension: Secondary | ICD-10-CM | POA: Diagnosis not present

## 2017-04-21 DIAGNOSIS — R1310 Dysphagia, unspecified: Secondary | ICD-10-CM | POA: Diagnosis not present

## 2017-04-21 DIAGNOSIS — E7800 Pure hypercholesterolemia, unspecified: Secondary | ICD-10-CM | POA: Diagnosis not present

## 2017-04-21 DIAGNOSIS — Z9089 Acquired absence of other organs: Secondary | ICD-10-CM | POA: Diagnosis not present

## 2017-04-21 DIAGNOSIS — Z87891 Personal history of nicotine dependence: Secondary | ICD-10-CM | POA: Diagnosis not present

## 2017-04-29 DIAGNOSIS — C099 Malignant neoplasm of tonsil, unspecified: Secondary | ICD-10-CM | POA: Diagnosis not present

## 2017-04-30 DIAGNOSIS — I890 Lymphedema, not elsewhere classified: Secondary | ICD-10-CM | POA: Diagnosis not present

## 2017-04-30 DIAGNOSIS — C099 Malignant neoplasm of tonsil, unspecified: Secondary | ICD-10-CM | POA: Diagnosis not present

## 2017-05-27 DIAGNOSIS — I890 Lymphedema, not elsewhere classified: Secondary | ICD-10-CM | POA: Diagnosis not present

## 2017-05-27 DIAGNOSIS — C099 Malignant neoplasm of tonsil, unspecified: Secondary | ICD-10-CM | POA: Diagnosis not present

## 2017-05-31 DIAGNOSIS — I890 Lymphedema, not elsewhere classified: Secondary | ICD-10-CM | POA: Diagnosis not present

## 2017-05-31 DIAGNOSIS — C099 Malignant neoplasm of tonsil, unspecified: Secondary | ICD-10-CM | POA: Diagnosis not present

## 2017-06-07 DIAGNOSIS — J189 Pneumonia, unspecified organism: Secondary | ICD-10-CM | POA: Diagnosis not present

## 2017-06-08 DIAGNOSIS — C099 Malignant neoplasm of tonsil, unspecified: Secondary | ICD-10-CM | POA: Diagnosis not present

## 2017-06-08 DIAGNOSIS — I890 Lymphedema, not elsewhere classified: Secondary | ICD-10-CM | POA: Diagnosis not present

## 2017-06-10 DIAGNOSIS — I890 Lymphedema, not elsewhere classified: Secondary | ICD-10-CM | POA: Diagnosis not present

## 2017-06-10 DIAGNOSIS — C099 Malignant neoplasm of tonsil, unspecified: Secondary | ICD-10-CM | POA: Diagnosis not present

## 2017-06-17 DIAGNOSIS — C099 Malignant neoplasm of tonsil, unspecified: Secondary | ICD-10-CM | POA: Diagnosis not present

## 2017-06-17 DIAGNOSIS — I890 Lymphedema, not elsewhere classified: Secondary | ICD-10-CM | POA: Diagnosis not present

## 2017-06-22 DIAGNOSIS — I890 Lymphedema, not elsewhere classified: Secondary | ICD-10-CM | POA: Diagnosis not present

## 2017-06-22 DIAGNOSIS — C099 Malignant neoplasm of tonsil, unspecified: Secondary | ICD-10-CM | POA: Diagnosis not present

## 2017-06-24 DIAGNOSIS — C099 Malignant neoplasm of tonsil, unspecified: Secondary | ICD-10-CM | POA: Diagnosis not present

## 2017-06-24 DIAGNOSIS — I890 Lymphedema, not elsewhere classified: Secondary | ICD-10-CM | POA: Diagnosis not present

## 2017-06-30 DIAGNOSIS — I890 Lymphedema, not elsewhere classified: Secondary | ICD-10-CM | POA: Diagnosis not present

## 2017-06-30 DIAGNOSIS — C099 Malignant neoplasm of tonsil, unspecified: Secondary | ICD-10-CM | POA: Diagnosis not present

## 2017-07-05 DIAGNOSIS — C099 Malignant neoplasm of tonsil, unspecified: Secondary | ICD-10-CM | POA: Diagnosis not present

## 2017-07-05 DIAGNOSIS — I890 Lymphedema, not elsewhere classified: Secondary | ICD-10-CM | POA: Diagnosis not present

## 2017-07-06 DIAGNOSIS — Z85818 Personal history of malignant neoplasm of other sites of lip, oral cavity, and pharynx: Secondary | ICD-10-CM | POA: Diagnosis not present

## 2017-07-06 DIAGNOSIS — R946 Abnormal results of thyroid function studies: Secondary | ICD-10-CM | POA: Diagnosis not present

## 2017-07-06 DIAGNOSIS — Z8579 Personal history of other malignant neoplasms of lymphoid, hematopoietic and related tissues: Secondary | ICD-10-CM | POA: Diagnosis not present

## 2017-07-06 DIAGNOSIS — Z923 Personal history of irradiation: Secondary | ICD-10-CM | POA: Diagnosis not present

## 2017-07-06 DIAGNOSIS — Z79899 Other long term (current) drug therapy: Secondary | ICD-10-CM | POA: Diagnosis not present

## 2017-07-08 DIAGNOSIS — I890 Lymphedema, not elsewhere classified: Secondary | ICD-10-CM | POA: Diagnosis not present

## 2017-07-08 DIAGNOSIS — C099 Malignant neoplasm of tonsil, unspecified: Secondary | ICD-10-CM | POA: Diagnosis not present

## 2017-07-21 DIAGNOSIS — R490 Dysphonia: Secondary | ICD-10-CM | POA: Diagnosis not present

## 2017-07-21 DIAGNOSIS — I890 Lymphedema, not elsewhere classified: Secondary | ICD-10-CM | POA: Diagnosis not present

## 2017-07-21 DIAGNOSIS — L988 Other specified disorders of the skin and subcutaneous tissue: Secondary | ICD-10-CM | POA: Diagnosis not present

## 2017-07-21 DIAGNOSIS — C099 Malignant neoplasm of tonsil, unspecified: Secondary | ICD-10-CM | POA: Diagnosis not present

## 2017-07-21 DIAGNOSIS — Z9089 Acquired absence of other organs: Secondary | ICD-10-CM | POA: Diagnosis not present

## 2017-07-21 DIAGNOSIS — K1379 Other lesions of oral mucosa: Secondary | ICD-10-CM | POA: Diagnosis not present

## 2017-07-21 DIAGNOSIS — Z85818 Personal history of malignant neoplasm of other sites of lip, oral cavity, and pharynx: Secondary | ICD-10-CM | POA: Diagnosis not present

## 2017-09-15 DIAGNOSIS — R7309 Other abnormal glucose: Secondary | ICD-10-CM | POA: Diagnosis not present

## 2017-09-15 DIAGNOSIS — Z79899 Other long term (current) drug therapy: Secondary | ICD-10-CM | POA: Diagnosis not present

## 2017-09-15 DIAGNOSIS — E039 Hypothyroidism, unspecified: Secondary | ICD-10-CM | POA: Diagnosis not present

## 2017-10-20 DIAGNOSIS — C099 Malignant neoplasm of tonsil, unspecified: Secondary | ICD-10-CM | POA: Diagnosis not present

## 2017-10-20 DIAGNOSIS — Z87891 Personal history of nicotine dependence: Secondary | ICD-10-CM | POA: Diagnosis not present

## 2017-10-20 DIAGNOSIS — I890 Lymphedema, not elsewhere classified: Secondary | ICD-10-CM | POA: Diagnosis not present

## 2017-10-20 DIAGNOSIS — R1312 Dysphagia, oropharyngeal phase: Secondary | ICD-10-CM | POA: Diagnosis not present

## 2017-10-20 DIAGNOSIS — R1310 Dysphagia, unspecified: Secondary | ICD-10-CM | POA: Diagnosis not present

## 2017-10-20 DIAGNOSIS — Z9889 Other specified postprocedural states: Secondary | ICD-10-CM | POA: Diagnosis not present

## 2017-10-20 DIAGNOSIS — K1379 Other lesions of oral mucosa: Secondary | ICD-10-CM | POA: Diagnosis not present

## 2017-10-20 DIAGNOSIS — Z85818 Personal history of malignant neoplasm of other sites of lip, oral cavity, and pharynx: Secondary | ICD-10-CM | POA: Diagnosis not present

## 2017-10-20 DIAGNOSIS — M272 Inflammatory conditions of jaws: Secondary | ICD-10-CM | POA: Diagnosis not present

## 2017-10-20 DIAGNOSIS — L598 Other specified disorders of the skin and subcutaneous tissue related to radiation: Secondary | ICD-10-CM | POA: Diagnosis not present

## 2017-10-20 DIAGNOSIS — Z9989 Dependence on other enabling machines and devices: Secondary | ICD-10-CM | POA: Diagnosis not present

## 2017-10-20 DIAGNOSIS — G4733 Obstructive sleep apnea (adult) (pediatric): Secondary | ICD-10-CM | POA: Diagnosis not present

## 2017-10-25 DIAGNOSIS — Z1331 Encounter for screening for depression: Secondary | ICD-10-CM | POA: Diagnosis not present

## 2017-10-25 DIAGNOSIS — G4733 Obstructive sleep apnea (adult) (pediatric): Secondary | ICD-10-CM | POA: Diagnosis not present

## 2017-10-25 DIAGNOSIS — Z6829 Body mass index (BMI) 29.0-29.9, adult: Secondary | ICD-10-CM | POA: Diagnosis not present

## 2017-10-25 DIAGNOSIS — Z9181 History of falling: Secondary | ICD-10-CM | POA: Diagnosis not present

## 2017-11-01 DIAGNOSIS — Z923 Personal history of irradiation: Secondary | ICD-10-CM

## 2017-11-01 DIAGNOSIS — D693 Immune thrombocytopenic purpura: Secondary | ICD-10-CM

## 2017-11-01 DIAGNOSIS — Z8579 Personal history of other malignant neoplasms of lymphoid, hematopoietic and related tissues: Secondary | ICD-10-CM

## 2017-11-01 DIAGNOSIS — Z9081 Acquired absence of spleen: Secondary | ICD-10-CM

## 2017-11-04 DIAGNOSIS — D693 Immune thrombocytopenic purpura: Secondary | ICD-10-CM | POA: Diagnosis not present

## 2017-11-04 DIAGNOSIS — Z9081 Acquired absence of spleen: Secondary | ICD-10-CM | POA: Diagnosis not present

## 2017-11-04 DIAGNOSIS — D649 Anemia, unspecified: Secondary | ICD-10-CM | POA: Diagnosis not present

## 2017-11-04 DIAGNOSIS — Z8579 Personal history of other malignant neoplasms of lymphoid, hematopoietic and related tissues: Secondary | ICD-10-CM | POA: Diagnosis not present

## 2017-11-04 DIAGNOSIS — Z923 Personal history of irradiation: Secondary | ICD-10-CM | POA: Diagnosis not present

## 2017-11-16 DIAGNOSIS — Z923 Personal history of irradiation: Secondary | ICD-10-CM | POA: Diagnosis not present

## 2017-11-16 DIAGNOSIS — D693 Immune thrombocytopenic purpura: Secondary | ICD-10-CM | POA: Diagnosis not present

## 2017-11-16 DIAGNOSIS — Z8579 Personal history of other malignant neoplasms of lymphoid, hematopoietic and related tissues: Secondary | ICD-10-CM | POA: Diagnosis not present

## 2017-11-16 DIAGNOSIS — Z9081 Acquired absence of spleen: Secondary | ICD-10-CM | POA: Diagnosis not present

## 2017-11-16 DIAGNOSIS — C090 Malignant neoplasm of tonsillar fossa: Secondary | ICD-10-CM | POA: Diagnosis not present

## 2017-11-22 DIAGNOSIS — C099 Malignant neoplasm of tonsil, unspecified: Secondary | ICD-10-CM | POA: Diagnosis not present

## 2017-11-22 DIAGNOSIS — T17908A Unspecified foreign body in respiratory tract, part unspecified causing other injury, initial encounter: Secondary | ICD-10-CM | POA: Diagnosis not present

## 2017-11-22 DIAGNOSIS — R1312 Dysphagia, oropharyngeal phase: Secondary | ICD-10-CM | POA: Diagnosis not present

## 2017-12-09 DIAGNOSIS — C099 Malignant neoplasm of tonsil, unspecified: Secondary | ICD-10-CM | POA: Diagnosis not present

## 2017-12-09 DIAGNOSIS — R633 Feeding difficulties: Secondary | ICD-10-CM | POA: Diagnosis not present

## 2017-12-09 DIAGNOSIS — R1312 Dysphagia, oropharyngeal phase: Secondary | ICD-10-CM | POA: Diagnosis not present

## 2017-12-09 DIAGNOSIS — R1313 Dysphagia, pharyngeal phase: Secondary | ICD-10-CM | POA: Diagnosis not present

## 2017-12-16 DIAGNOSIS — R1312 Dysphagia, oropharyngeal phase: Secondary | ICD-10-CM | POA: Diagnosis not present

## 2017-12-22 DIAGNOSIS — R1312 Dysphagia, oropharyngeal phase: Secondary | ICD-10-CM | POA: Diagnosis not present

## 2017-12-29 DIAGNOSIS — R1312 Dysphagia, oropharyngeal phase: Secondary | ICD-10-CM | POA: Diagnosis not present

## 2018-01-20 DIAGNOSIS — Z9081 Acquired absence of spleen: Secondary | ICD-10-CM | POA: Diagnosis not present

## 2018-01-20 DIAGNOSIS — Z923 Personal history of irradiation: Secondary | ICD-10-CM | POA: Diagnosis not present

## 2018-01-20 DIAGNOSIS — D693 Immune thrombocytopenic purpura: Secondary | ICD-10-CM | POA: Diagnosis not present

## 2018-01-20 DIAGNOSIS — Z8579 Personal history of other malignant neoplasms of lymphoid, hematopoietic and related tissues: Secondary | ICD-10-CM | POA: Diagnosis not present

## 2018-02-14 DIAGNOSIS — Z23 Encounter for immunization: Secondary | ICD-10-CM | POA: Diagnosis not present

## 2018-02-18 DIAGNOSIS — J069 Acute upper respiratory infection, unspecified: Secondary | ICD-10-CM | POA: Diagnosis not present

## 2018-02-18 DIAGNOSIS — J028 Acute pharyngitis due to other specified organisms: Secondary | ICD-10-CM | POA: Diagnosis not present

## 2018-02-19 DIAGNOSIS — R061 Stridor: Secondary | ICD-10-CM | POA: Diagnosis not present

## 2018-02-19 DIAGNOSIS — J028 Acute pharyngitis due to other specified organisms: Secondary | ICD-10-CM | POA: Diagnosis not present

## 2018-02-21 DIAGNOSIS — D693 Immune thrombocytopenic purpura: Secondary | ICD-10-CM | POA: Diagnosis not present

## 2018-02-23 DIAGNOSIS — Z6829 Body mass index (BMI) 29.0-29.9, adult: Secondary | ICD-10-CM | POA: Diagnosis not present

## 2018-02-23 DIAGNOSIS — Z85818 Personal history of malignant neoplasm of other sites of lip, oral cavity, and pharynx: Secondary | ICD-10-CM | POA: Diagnosis not present

## 2018-02-23 DIAGNOSIS — T66XXXS Radiation sickness, unspecified, sequela: Secondary | ICD-10-CM | POA: Diagnosis not present

## 2018-02-23 DIAGNOSIS — G4733 Obstructive sleep apnea (adult) (pediatric): Secondary | ICD-10-CM | POA: Diagnosis not present

## 2018-02-28 DIAGNOSIS — R1312 Dysphagia, oropharyngeal phase: Secondary | ICD-10-CM | POA: Diagnosis not present

## 2018-02-28 DIAGNOSIS — T17320A Food in larynx causing asphyxiation, initial encounter: Secondary | ICD-10-CM | POA: Diagnosis not present

## 2018-02-28 DIAGNOSIS — R633 Feeding difficulties: Secondary | ICD-10-CM | POA: Diagnosis not present

## 2018-03-21 DIAGNOSIS — Z125 Encounter for screening for malignant neoplasm of prostate: Secondary | ICD-10-CM | POA: Diagnosis not present

## 2018-03-21 DIAGNOSIS — Z79899 Other long term (current) drug therapy: Secondary | ICD-10-CM | POA: Diagnosis not present

## 2018-03-21 DIAGNOSIS — E039 Hypothyroidism, unspecified: Secondary | ICD-10-CM | POA: Diagnosis not present

## 2018-03-21 DIAGNOSIS — Z0000 Encounter for general adult medical examination without abnormal findings: Secondary | ICD-10-CM | POA: Diagnosis not present

## 2018-03-21 DIAGNOSIS — Z85818 Personal history of malignant neoplasm of other sites of lip, oral cavity, and pharynx: Secondary | ICD-10-CM | POA: Diagnosis not present

## 2018-03-21 DIAGNOSIS — F329 Major depressive disorder, single episode, unspecified: Secondary | ICD-10-CM | POA: Diagnosis not present

## 2018-03-21 DIAGNOSIS — E785 Hyperlipidemia, unspecified: Secondary | ICD-10-CM | POA: Diagnosis not present

## 2018-03-28 DIAGNOSIS — L821 Other seborrheic keratosis: Secondary | ICD-10-CM | POA: Diagnosis not present

## 2018-03-28 DIAGNOSIS — L820 Inflamed seborrheic keratosis: Secondary | ICD-10-CM | POA: Diagnosis not present

## 2018-04-20 DIAGNOSIS — R1310 Dysphagia, unspecified: Secondary | ICD-10-CM | POA: Diagnosis not present

## 2018-04-20 DIAGNOSIS — M272 Inflammatory conditions of jaws: Secondary | ICD-10-CM | POA: Diagnosis not present

## 2018-04-20 DIAGNOSIS — R918 Other nonspecific abnormal finding of lung field: Secondary | ICD-10-CM | POA: Diagnosis not present

## 2018-04-20 DIAGNOSIS — C099 Malignant neoplasm of tonsil, unspecified: Secondary | ICD-10-CM | POA: Diagnosis not present

## 2018-04-20 DIAGNOSIS — Y842 Radiological procedure and radiotherapy as the cause of abnormal reaction of the patient, or of later complication, without mention of misadventure at the time of the procedure: Secondary | ICD-10-CM | POA: Diagnosis not present

## 2018-04-30 DIAGNOSIS — J019 Acute sinusitis, unspecified: Secondary | ICD-10-CM | POA: Diagnosis not present

## 2018-06-02 DIAGNOSIS — K121 Other forms of stomatitis: Secondary | ICD-10-CM | POA: Diagnosis not present

## 2018-06-02 DIAGNOSIS — C099 Malignant neoplasm of tonsil, unspecified: Secondary | ICD-10-CM | POA: Diagnosis not present

## 2018-06-02 DIAGNOSIS — Z923 Personal history of irradiation: Secondary | ICD-10-CM | POA: Diagnosis not present

## 2018-06-02 DIAGNOSIS — I890 Lymphedema, not elsewhere classified: Secondary | ICD-10-CM | POA: Diagnosis not present

## 2018-06-02 DIAGNOSIS — R1312 Dysphagia, oropharyngeal phase: Secondary | ICD-10-CM | POA: Diagnosis not present

## 2018-06-02 DIAGNOSIS — Z87891 Personal history of nicotine dependence: Secondary | ICD-10-CM | POA: Diagnosis not present

## 2018-06-02 DIAGNOSIS — R061 Stridor: Secondary | ICD-10-CM | POA: Diagnosis not present

## 2018-06-02 DIAGNOSIS — R1310 Dysphagia, unspecified: Secondary | ICD-10-CM | POA: Diagnosis not present

## 2018-06-13 DIAGNOSIS — G4733 Obstructive sleep apnea (adult) (pediatric): Secondary | ICD-10-CM | POA: Diagnosis not present

## 2018-06-13 DIAGNOSIS — Z87891 Personal history of nicotine dependence: Secondary | ICD-10-CM | POA: Diagnosis not present

## 2018-06-13 DIAGNOSIS — E079 Disorder of thyroid, unspecified: Secondary | ICD-10-CM | POA: Insufficient documentation

## 2018-06-13 DIAGNOSIS — R1312 Dysphagia, oropharyngeal phase: Secondary | ICD-10-CM | POA: Diagnosis not present

## 2018-06-13 DIAGNOSIS — Z9989 Dependence on other enabling machines and devices: Secondary | ICD-10-CM | POA: Diagnosis not present

## 2018-06-13 DIAGNOSIS — F1010 Alcohol abuse, uncomplicated: Secondary | ICD-10-CM

## 2018-06-13 DIAGNOSIS — Z923 Personal history of irradiation: Secondary | ICD-10-CM | POA: Diagnosis not present

## 2018-06-13 DIAGNOSIS — Z9081 Acquired absence of spleen: Secondary | ICD-10-CM | POA: Diagnosis not present

## 2018-06-13 DIAGNOSIS — Z85818 Personal history of malignant neoplasm of other sites of lip, oral cavity, and pharynx: Secondary | ICD-10-CM | POA: Diagnosis not present

## 2018-06-13 DIAGNOSIS — Z9221 Personal history of antineoplastic chemotherapy: Secondary | ICD-10-CM | POA: Diagnosis not present

## 2018-06-13 HISTORY — DX: Alcohol abuse, uncomplicated: F10.10

## 2018-06-13 HISTORY — DX: Disorder of thyroid, unspecified: E07.9

## 2018-06-16 DIAGNOSIS — Z923 Personal history of irradiation: Secondary | ICD-10-CM | POA: Diagnosis not present

## 2018-06-16 DIAGNOSIS — I890 Lymphedema, not elsewhere classified: Secondary | ICD-10-CM | POA: Diagnosis not present

## 2018-06-16 DIAGNOSIS — Z85818 Personal history of malignant neoplasm of other sites of lip, oral cavity, and pharynx: Secondary | ICD-10-CM | POA: Diagnosis not present

## 2018-06-16 DIAGNOSIS — R061 Stridor: Secondary | ICD-10-CM | POA: Diagnosis not present

## 2018-06-16 DIAGNOSIS — C760 Malignant neoplasm of head, face and neck: Secondary | ICD-10-CM | POA: Diagnosis not present

## 2018-06-16 DIAGNOSIS — R1319 Other dysphagia: Secondary | ICD-10-CM | POA: Diagnosis not present

## 2018-06-16 DIAGNOSIS — Z452 Encounter for adjustment and management of vascular access device: Secondary | ICD-10-CM | POA: Diagnosis not present

## 2018-06-16 DIAGNOSIS — R1312 Dysphagia, oropharyngeal phase: Secondary | ICD-10-CM | POA: Diagnosis not present

## 2018-06-16 DIAGNOSIS — Z931 Gastrostomy status: Secondary | ICD-10-CM | POA: Insufficient documentation

## 2018-06-16 DIAGNOSIS — Z431 Encounter for attention to gastrostomy: Secondary | ICD-10-CM | POA: Diagnosis not present

## 2018-06-16 DIAGNOSIS — R1310 Dysphagia, unspecified: Secondary | ICD-10-CM | POA: Diagnosis not present

## 2018-06-16 HISTORY — DX: Gastrostomy status: Z93.1

## 2018-06-20 DIAGNOSIS — Z452 Encounter for adjustment and management of vascular access device: Secondary | ICD-10-CM | POA: Diagnosis not present

## 2018-06-20 DIAGNOSIS — Z931 Gastrostomy status: Secondary | ICD-10-CM | POA: Diagnosis not present

## 2018-07-21 DIAGNOSIS — Z85818 Personal history of malignant neoplasm of other sites of lip, oral cavity, and pharynx: Secondary | ICD-10-CM | POA: Diagnosis not present

## 2018-07-21 DIAGNOSIS — D693 Immune thrombocytopenic purpura: Secondary | ICD-10-CM

## 2018-07-21 DIAGNOSIS — Z79899 Other long term (current) drug therapy: Secondary | ICD-10-CM | POA: Diagnosis not present

## 2018-09-15 DIAGNOSIS — E785 Hyperlipidemia, unspecified: Secondary | ICD-10-CM | POA: Diagnosis not present

## 2018-09-15 DIAGNOSIS — E039 Hypothyroidism, unspecified: Secondary | ICD-10-CM | POA: Diagnosis not present

## 2018-10-12 DIAGNOSIS — Z923 Personal history of irradiation: Secondary | ICD-10-CM | POA: Diagnosis not present

## 2018-10-12 DIAGNOSIS — R1310 Dysphagia, unspecified: Secondary | ICD-10-CM | POA: Diagnosis not present

## 2018-10-12 DIAGNOSIS — C099 Malignant neoplasm of tonsil, unspecified: Secondary | ICD-10-CM | POA: Diagnosis not present

## 2018-10-12 DIAGNOSIS — R1312 Dysphagia, oropharyngeal phase: Secondary | ICD-10-CM | POA: Diagnosis not present

## 2018-10-12 DIAGNOSIS — R061 Stridor: Secondary | ICD-10-CM | POA: Diagnosis not present

## 2018-10-12 DIAGNOSIS — I890 Lymphedema, not elsewhere classified: Secondary | ICD-10-CM | POA: Diagnosis not present

## 2018-10-12 DIAGNOSIS — Y842 Radiological procedure and radiotherapy as the cause of abnormal reaction of the patient, or of later complication, without mention of misadventure at the time of the procedure: Secondary | ICD-10-CM | POA: Diagnosis not present

## 2018-10-12 DIAGNOSIS — M272 Inflammatory conditions of jaws: Secondary | ICD-10-CM | POA: Diagnosis not present

## 2018-10-12 DIAGNOSIS — Z931 Gastrostomy status: Secondary | ICD-10-CM | POA: Diagnosis not present

## 2018-10-15 DIAGNOSIS — E039 Hypothyroidism, unspecified: Secondary | ICD-10-CM | POA: Diagnosis not present

## 2018-10-15 DIAGNOSIS — E785 Hyperlipidemia, unspecified: Secondary | ICD-10-CM | POA: Diagnosis not present

## 2018-10-20 DIAGNOSIS — J312 Chronic pharyngitis: Secondary | ICD-10-CM | POA: Diagnosis not present

## 2018-10-20 DIAGNOSIS — R070 Pain in throat: Secondary | ICD-10-CM | POA: Diagnosis not present

## 2018-10-20 DIAGNOSIS — J029 Acute pharyngitis, unspecified: Secondary | ICD-10-CM | POA: Diagnosis not present

## 2018-10-20 DIAGNOSIS — Z20828 Contact with and (suspected) exposure to other viral communicable diseases: Secondary | ICD-10-CM | POA: Diagnosis not present

## 2018-11-15 DIAGNOSIS — Z6828 Body mass index (BMI) 28.0-28.9, adult: Secondary | ICD-10-CM | POA: Diagnosis not present

## 2018-11-15 DIAGNOSIS — Z79899 Other long term (current) drug therapy: Secondary | ICD-10-CM | POA: Diagnosis not present

## 2018-11-15 DIAGNOSIS — Z85818 Personal history of malignant neoplasm of other sites of lip, oral cavity, and pharynx: Secondary | ICD-10-CM | POA: Diagnosis not present

## 2018-11-15 DIAGNOSIS — G4733 Obstructive sleep apnea (adult) (pediatric): Secondary | ICD-10-CM | POA: Diagnosis not present

## 2018-11-15 DIAGNOSIS — E039 Hypothyroidism, unspecified: Secondary | ICD-10-CM | POA: Diagnosis not present

## 2018-12-15 ENCOUNTER — Other Ambulatory Visit: Payer: Self-pay

## 2019-01-06 DIAGNOSIS — C099 Malignant neoplasm of tonsil, unspecified: Secondary | ICD-10-CM | POA: Diagnosis not present

## 2019-01-20 DIAGNOSIS — C090 Malignant neoplasm of tonsillar fossa: Secondary | ICD-10-CM | POA: Diagnosis not present

## 2019-01-20 DIAGNOSIS — D696 Thrombocytopenia, unspecified: Secondary | ICD-10-CM | POA: Diagnosis not present

## 2019-01-20 DIAGNOSIS — D693 Immune thrombocytopenic purpura: Secondary | ICD-10-CM | POA: Diagnosis not present

## 2019-01-20 DIAGNOSIS — Z79899 Other long term (current) drug therapy: Secondary | ICD-10-CM | POA: Diagnosis not present

## 2019-01-20 DIAGNOSIS — D649 Anemia, unspecified: Secondary | ICD-10-CM | POA: Diagnosis not present

## 2019-01-27 DIAGNOSIS — L598 Other specified disorders of the skin and subcutaneous tissue related to radiation: Secondary | ICD-10-CM | POA: Diagnosis not present

## 2019-01-27 DIAGNOSIS — Z7289 Other problems related to lifestyle: Secondary | ICD-10-CM | POA: Diagnosis not present

## 2019-01-27 DIAGNOSIS — Y842 Radiological procedure and radiotherapy as the cause of abnormal reaction of the patient, or of later complication, without mention of misadventure at the time of the procedure: Secondary | ICD-10-CM | POA: Diagnosis not present

## 2019-01-27 DIAGNOSIS — Z85818 Personal history of malignant neoplasm of other sites of lip, oral cavity, and pharynx: Secondary | ICD-10-CM | POA: Diagnosis not present

## 2019-01-27 DIAGNOSIS — Z923 Personal history of irradiation: Secondary | ICD-10-CM | POA: Diagnosis not present

## 2019-01-27 DIAGNOSIS — R061 Stridor: Secondary | ICD-10-CM | POA: Diagnosis not present

## 2019-01-27 DIAGNOSIS — R1310 Dysphagia, unspecified: Secondary | ICD-10-CM | POA: Diagnosis not present

## 2019-01-27 DIAGNOSIS — Z931 Gastrostomy status: Secondary | ICD-10-CM | POA: Diagnosis not present

## 2019-01-27 DIAGNOSIS — M272 Inflammatory conditions of jaws: Secondary | ICD-10-CM | POA: Diagnosis not present

## 2019-01-27 DIAGNOSIS — Z9889 Other specified postprocedural states: Secondary | ICD-10-CM | POA: Diagnosis not present

## 2019-01-27 DIAGNOSIS — Z87891 Personal history of nicotine dependence: Secondary | ICD-10-CM | POA: Diagnosis not present

## 2019-01-27 DIAGNOSIS — I898 Other specified noninfective disorders of lymphatic vessels and lymph nodes: Secondary | ICD-10-CM | POA: Diagnosis not present

## 2019-01-27 DIAGNOSIS — C099 Malignant neoplasm of tonsil, unspecified: Secondary | ICD-10-CM | POA: Diagnosis not present

## 2019-01-27 DIAGNOSIS — M5382 Other specified dorsopathies, cervical region: Secondary | ICD-10-CM | POA: Diagnosis not present

## 2019-01-27 DIAGNOSIS — Z86718 Personal history of other venous thrombosis and embolism: Secondary | ICD-10-CM | POA: Diagnosis not present

## 2019-01-27 DIAGNOSIS — J988 Other specified respiratory disorders: Secondary | ICD-10-CM | POA: Diagnosis not present

## 2019-02-08 DIAGNOSIS — J988 Other specified respiratory disorders: Secondary | ICD-10-CM | POA: Insufficient documentation

## 2019-02-08 HISTORY — DX: Other specified respiratory disorders: J98.8

## 2019-02-14 DIAGNOSIS — Z23 Encounter for immunization: Secondary | ICD-10-CM | POA: Diagnosis not present

## 2019-02-19 DIAGNOSIS — R778 Other specified abnormalities of plasma proteins: Secondary | ICD-10-CM | POA: Diagnosis not present

## 2019-02-19 DIAGNOSIS — R000 Tachycardia, unspecified: Secondary | ICD-10-CM | POA: Diagnosis not present

## 2019-02-19 DIAGNOSIS — J189 Pneumonia, unspecified organism: Secondary | ICD-10-CM | POA: Diagnosis not present

## 2019-02-19 DIAGNOSIS — R0602 Shortness of breath: Secondary | ICD-10-CM | POA: Diagnosis not present

## 2019-02-19 DIAGNOSIS — Z03818 Encounter for observation for suspected exposure to other biological agents ruled out: Secondary | ICD-10-CM | POA: Diagnosis not present

## 2019-02-21 DIAGNOSIS — E785 Hyperlipidemia, unspecified: Secondary | ICD-10-CM | POA: Diagnosis not present

## 2019-02-21 DIAGNOSIS — Z20828 Contact with and (suspected) exposure to other viral communicable diseases: Secondary | ICD-10-CM | POA: Diagnosis not present

## 2019-02-21 DIAGNOSIS — E7800 Pure hypercholesterolemia, unspecified: Secondary | ICD-10-CM | POA: Diagnosis not present

## 2019-02-21 DIAGNOSIS — I083 Combined rheumatic disorders of mitral, aortic and tricuspid valves: Secondary | ICD-10-CM | POA: Diagnosis not present

## 2019-02-21 DIAGNOSIS — R0602 Shortness of breath: Secondary | ICD-10-CM | POA: Diagnosis not present

## 2019-02-21 DIAGNOSIS — I517 Cardiomegaly: Secondary | ICD-10-CM | POA: Diagnosis not present

## 2019-02-21 DIAGNOSIS — I444 Left anterior fascicular block: Secondary | ICD-10-CM | POA: Diagnosis not present

## 2019-02-21 DIAGNOSIS — I110 Hypertensive heart disease with heart failure: Secondary | ICD-10-CM | POA: Diagnosis not present

## 2019-02-21 DIAGNOSIS — Z931 Gastrostomy status: Secondary | ICD-10-CM | POA: Diagnosis not present

## 2019-02-21 DIAGNOSIS — Z923 Personal history of irradiation: Secondary | ICD-10-CM | POA: Diagnosis not present

## 2019-02-21 DIAGNOSIS — R7989 Other specified abnormal findings of blood chemistry: Secondary | ICD-10-CM | POA: Diagnosis not present

## 2019-02-21 DIAGNOSIS — I5022 Chronic systolic (congestive) heart failure: Secondary | ICD-10-CM | POA: Diagnosis not present

## 2019-02-21 DIAGNOSIS — M272 Inflammatory conditions of jaws: Secondary | ICD-10-CM | POA: Diagnosis not present

## 2019-02-21 DIAGNOSIS — Z87891 Personal history of nicotine dependence: Secondary | ICD-10-CM | POA: Diagnosis not present

## 2019-02-21 DIAGNOSIS — I441 Atrioventricular block, second degree: Secondary | ICD-10-CM | POA: Diagnosis not present

## 2019-02-21 DIAGNOSIS — G4733 Obstructive sleep apnea (adult) (pediatric): Secondary | ICD-10-CM | POA: Diagnosis not present

## 2019-02-21 DIAGNOSIS — R000 Tachycardia, unspecified: Secondary | ICD-10-CM | POA: Diagnosis not present

## 2019-02-21 DIAGNOSIS — Z9081 Acquired absence of spleen: Secondary | ICD-10-CM | POA: Diagnosis not present

## 2019-02-21 DIAGNOSIS — I10 Essential (primary) hypertension: Secondary | ICD-10-CM | POA: Diagnosis not present

## 2019-02-21 DIAGNOSIS — C099 Malignant neoplasm of tonsil, unspecified: Secondary | ICD-10-CM | POA: Diagnosis not present

## 2019-02-21 DIAGNOSIS — F329 Major depressive disorder, single episode, unspecified: Secondary | ICD-10-CM | POA: Diagnosis not present

## 2019-02-21 DIAGNOSIS — Z01818 Encounter for other preprocedural examination: Secondary | ICD-10-CM | POA: Diagnosis not present

## 2019-02-21 DIAGNOSIS — Z01810 Encounter for preprocedural cardiovascular examination: Secondary | ICD-10-CM | POA: Diagnosis not present

## 2019-02-22 DIAGNOSIS — M272 Inflammatory conditions of jaws: Secondary | ICD-10-CM | POA: Diagnosis not present

## 2019-02-22 DIAGNOSIS — E785 Hyperlipidemia, unspecified: Secondary | ICD-10-CM | POA: Diagnosis not present

## 2019-02-22 DIAGNOSIS — C099 Malignant neoplasm of tonsil, unspecified: Secondary | ICD-10-CM | POA: Diagnosis not present

## 2019-02-22 DIAGNOSIS — I110 Hypertensive heart disease with heart failure: Secondary | ICD-10-CM | POA: Diagnosis not present

## 2019-02-22 DIAGNOSIS — I5022 Chronic systolic (congestive) heart failure: Secondary | ICD-10-CM | POA: Diagnosis not present

## 2019-02-22 DIAGNOSIS — E7800 Pure hypercholesterolemia, unspecified: Secondary | ICD-10-CM | POA: Diagnosis not present

## 2019-02-22 DIAGNOSIS — Z931 Gastrostomy status: Secondary | ICD-10-CM | POA: Diagnosis not present

## 2019-02-22 DIAGNOSIS — G4733 Obstructive sleep apnea (adult) (pediatric): Secondary | ICD-10-CM | POA: Diagnosis not present

## 2019-02-28 DIAGNOSIS — I10 Essential (primary) hypertension: Secondary | ICD-10-CM | POA: Diagnosis not present

## 2019-02-28 DIAGNOSIS — E039 Hypothyroidism, unspecified: Secondary | ICD-10-CM | POA: Diagnosis not present

## 2019-02-28 DIAGNOSIS — K9429 Other complications of gastrostomy: Secondary | ICD-10-CM | POA: Diagnosis not present

## 2019-02-28 DIAGNOSIS — I491 Atrial premature depolarization: Secondary | ICD-10-CM | POA: Diagnosis not present

## 2019-02-28 DIAGNOSIS — C099 Malignant neoplasm of tonsil, unspecified: Secondary | ICD-10-CM | POA: Diagnosis not present

## 2019-02-28 DIAGNOSIS — I444 Left anterior fascicular block: Secondary | ICD-10-CM | POA: Diagnosis not present

## 2019-02-28 DIAGNOSIS — J988 Other specified respiratory disorders: Secondary | ICD-10-CM | POA: Diagnosis not present

## 2019-02-28 DIAGNOSIS — Z930 Tracheostomy status: Secondary | ICD-10-CM | POA: Diagnosis not present

## 2019-02-28 DIAGNOSIS — Z431 Encounter for attention to gastrostomy: Secondary | ICD-10-CM | POA: Diagnosis not present

## 2019-02-28 DIAGNOSIS — Z923 Personal history of irradiation: Secondary | ICD-10-CM | POA: Diagnosis not present

## 2019-03-01 DIAGNOSIS — I10 Essential (primary) hypertension: Secondary | ICD-10-CM | POA: Diagnosis not present

## 2019-03-01 DIAGNOSIS — J988 Other specified respiratory disorders: Secondary | ICD-10-CM | POA: Diagnosis not present

## 2019-03-01 DIAGNOSIS — E785 Hyperlipidemia, unspecified: Secondary | ICD-10-CM | POA: Diagnosis not present

## 2019-03-01 DIAGNOSIS — E039 Hypothyroidism, unspecified: Secondary | ICD-10-CM | POA: Diagnosis not present

## 2019-03-02 DIAGNOSIS — E039 Hypothyroidism, unspecified: Secondary | ICD-10-CM | POA: Diagnosis not present

## 2019-03-02 DIAGNOSIS — E785 Hyperlipidemia, unspecified: Secondary | ICD-10-CM | POA: Diagnosis not present

## 2019-03-02 DIAGNOSIS — I10 Essential (primary) hypertension: Secondary | ICD-10-CM | POA: Diagnosis not present

## 2019-03-02 DIAGNOSIS — F329 Major depressive disorder, single episode, unspecified: Secondary | ICD-10-CM | POA: Diagnosis not present

## 2019-03-07 DIAGNOSIS — Z930 Tracheostomy status: Secondary | ICD-10-CM | POA: Diagnosis not present

## 2019-03-07 DIAGNOSIS — Z1331 Encounter for screening for depression: Secondary | ICD-10-CM | POA: Diagnosis not present

## 2019-03-07 DIAGNOSIS — I429 Cardiomyopathy, unspecified: Secondary | ICD-10-CM | POA: Diagnosis not present

## 2019-03-07 DIAGNOSIS — Z6828 Body mass index (BMI) 28.0-28.9, adult: Secondary | ICD-10-CM | POA: Diagnosis not present

## 2019-03-15 DIAGNOSIS — J701 Chronic and other pulmonary manifestations due to radiation: Secondary | ICD-10-CM | POA: Diagnosis not present

## 2019-03-15 DIAGNOSIS — R0989 Other specified symptoms and signs involving the circulatory and respiratory systems: Secondary | ICD-10-CM | POA: Diagnosis not present

## 2019-03-15 DIAGNOSIS — I890 Lymphedema, not elsewhere classified: Secondary | ICD-10-CM | POA: Diagnosis not present

## 2019-03-15 DIAGNOSIS — R1310 Dysphagia, unspecified: Secondary | ICD-10-CM | POA: Diagnosis not present

## 2019-03-15 DIAGNOSIS — Z923 Personal history of irradiation: Secondary | ICD-10-CM | POA: Diagnosis not present

## 2019-03-15 DIAGNOSIS — Z9089 Acquired absence of other organs: Secondary | ICD-10-CM | POA: Diagnosis not present

## 2019-03-15 DIAGNOSIS — Z87891 Personal history of nicotine dependence: Secondary | ICD-10-CM | POA: Diagnosis not present

## 2019-03-15 DIAGNOSIS — R1312 Dysphagia, oropharyngeal phase: Secondary | ICD-10-CM | POA: Diagnosis not present

## 2019-03-15 DIAGNOSIS — Z85818 Personal history of malignant neoplasm of other sites of lip, oral cavity, and pharynx: Secondary | ICD-10-CM | POA: Diagnosis not present

## 2019-03-15 DIAGNOSIS — Z9889 Other specified postprocedural states: Secondary | ICD-10-CM | POA: Diagnosis not present

## 2019-03-15 DIAGNOSIS — Z08 Encounter for follow-up examination after completed treatment for malignant neoplasm: Secondary | ICD-10-CM | POA: Diagnosis not present

## 2019-03-15 DIAGNOSIS — Z931 Gastrostomy status: Secondary | ICD-10-CM | POA: Diagnosis not present

## 2019-03-16 DIAGNOSIS — Z79899 Other long term (current) drug therapy: Secondary | ICD-10-CM | POA: Diagnosis not present

## 2019-03-16 DIAGNOSIS — I110 Hypertensive heart disease with heart failure: Secondary | ICD-10-CM | POA: Diagnosis not present

## 2019-03-16 DIAGNOSIS — Z87891 Personal history of nicotine dependence: Secondary | ICD-10-CM | POA: Diagnosis not present

## 2019-03-16 DIAGNOSIS — Z8589 Personal history of malignant neoplasm of other organs and systems: Secondary | ICD-10-CM | POA: Diagnosis not present

## 2019-03-16 DIAGNOSIS — I5020 Unspecified systolic (congestive) heart failure: Secondary | ICD-10-CM | POA: Diagnosis not present

## 2019-03-16 DIAGNOSIS — I5023 Acute on chronic systolic (congestive) heart failure: Secondary | ICD-10-CM | POA: Diagnosis not present

## 2019-03-16 DIAGNOSIS — Z85818 Personal history of malignant neoplasm of other sites of lip, oral cavity, and pharynx: Secondary | ICD-10-CM | POA: Diagnosis not present

## 2019-03-17 DIAGNOSIS — E7800 Pure hypercholesterolemia, unspecified: Secondary | ICD-10-CM | POA: Diagnosis not present

## 2019-03-17 DIAGNOSIS — E039 Hypothyroidism, unspecified: Secondary | ICD-10-CM | POA: Diagnosis not present

## 2019-03-17 DIAGNOSIS — F329 Major depressive disorder, single episode, unspecified: Secondary | ICD-10-CM | POA: Diagnosis not present

## 2019-03-31 DIAGNOSIS — I5023 Acute on chronic systolic (congestive) heart failure: Secondary | ICD-10-CM | POA: Diagnosis not present

## 2019-03-31 DIAGNOSIS — R001 Bradycardia, unspecified: Secondary | ICD-10-CM | POA: Diagnosis not present

## 2019-03-31 DIAGNOSIS — R002 Palpitations: Secondary | ICD-10-CM | POA: Diagnosis not present

## 2019-04-06 DIAGNOSIS — Z931 Gastrostomy status: Secondary | ICD-10-CM | POA: Diagnosis not present

## 2019-04-06 DIAGNOSIS — Z923 Personal history of irradiation: Secondary | ICD-10-CM | POA: Diagnosis not present

## 2019-04-06 DIAGNOSIS — Z930 Tracheostomy status: Secondary | ICD-10-CM | POA: Diagnosis not present

## 2019-04-06 DIAGNOSIS — C099 Malignant neoplasm of tonsil, unspecified: Secondary | ICD-10-CM | POA: Diagnosis not present

## 2019-04-06 DIAGNOSIS — I890 Lymphedema, not elsewhere classified: Secondary | ICD-10-CM | POA: Diagnosis not present

## 2019-04-06 DIAGNOSIS — R1310 Dysphagia, unspecified: Secondary | ICD-10-CM | POA: Diagnosis not present

## 2019-04-06 DIAGNOSIS — Z87891 Personal history of nicotine dependence: Secondary | ICD-10-CM | POA: Diagnosis not present

## 2019-05-06 DIAGNOSIS — Z930 Tracheostomy status: Secondary | ICD-10-CM

## 2019-05-06 HISTORY — DX: Tracheostomy status: Z93.0

## 2019-05-24 DIAGNOSIS — Z7289 Other problems related to lifestyle: Secondary | ICD-10-CM | POA: Diagnosis not present

## 2019-05-24 DIAGNOSIS — Z87891 Personal history of nicotine dependence: Secondary | ICD-10-CM | POA: Diagnosis not present

## 2019-05-24 DIAGNOSIS — Z86718 Personal history of other venous thrombosis and embolism: Secondary | ICD-10-CM | POA: Diagnosis not present

## 2019-05-24 DIAGNOSIS — Z85818 Personal history of malignant neoplasm of other sites of lip, oral cavity, and pharynx: Secondary | ICD-10-CM | POA: Diagnosis not present

## 2019-05-24 DIAGNOSIS — Z930 Tracheostomy status: Secondary | ICD-10-CM | POA: Diagnosis not present

## 2019-05-24 DIAGNOSIS — Z923 Personal history of irradiation: Secondary | ICD-10-CM | POA: Diagnosis not present

## 2019-05-24 DIAGNOSIS — M272 Inflammatory conditions of jaws: Secondary | ICD-10-CM | POA: Diagnosis not present

## 2019-05-24 DIAGNOSIS — Z430 Encounter for attention to tracheostomy: Secondary | ICD-10-CM | POA: Diagnosis not present

## 2019-05-24 DIAGNOSIS — Y842 Radiological procedure and radiotherapy as the cause of abnormal reaction of the patient, or of later complication, without mention of misadventure at the time of the procedure: Secondary | ICD-10-CM | POA: Diagnosis not present

## 2019-06-01 DIAGNOSIS — I444 Left anterior fascicular block: Secondary | ICD-10-CM | POA: Diagnosis not present

## 2019-06-01 DIAGNOSIS — I5022 Chronic systolic (congestive) heart failure: Secondary | ICD-10-CM | POA: Diagnosis not present

## 2019-06-01 DIAGNOSIS — I5021 Acute systolic (congestive) heart failure: Secondary | ICD-10-CM | POA: Diagnosis not present

## 2019-06-01 DIAGNOSIS — I517 Cardiomegaly: Secondary | ICD-10-CM | POA: Diagnosis not present

## 2019-06-01 DIAGNOSIS — I110 Hypertensive heart disease with heart failure: Secondary | ICD-10-CM | POA: Diagnosis not present

## 2019-06-01 DIAGNOSIS — R001 Bradycardia, unspecified: Secondary | ICD-10-CM | POA: Diagnosis not present

## 2019-06-17 DIAGNOSIS — F329 Major depressive disorder, single episode, unspecified: Secondary | ICD-10-CM | POA: Diagnosis not present

## 2019-06-17 DIAGNOSIS — E039 Hypothyroidism, unspecified: Secondary | ICD-10-CM | POA: Diagnosis not present

## 2019-06-17 DIAGNOSIS — E7800 Pure hypercholesterolemia, unspecified: Secondary | ICD-10-CM | POA: Diagnosis not present

## 2019-06-19 DIAGNOSIS — S02609A Fracture of mandible, unspecified, initial encounter for closed fracture: Secondary | ICD-10-CM | POA: Diagnosis not present

## 2019-06-19 DIAGNOSIS — C099 Malignant neoplasm of tonsil, unspecified: Secondary | ICD-10-CM | POA: Diagnosis not present

## 2019-06-22 DIAGNOSIS — Z930 Tracheostomy status: Secondary | ICD-10-CM | POA: Diagnosis not present

## 2019-06-22 DIAGNOSIS — I890 Lymphedema, not elsewhere classified: Secondary | ICD-10-CM | POA: Diagnosis not present

## 2019-06-22 DIAGNOSIS — Z923 Personal history of irradiation: Secondary | ICD-10-CM | POA: Diagnosis not present

## 2019-06-22 DIAGNOSIS — Z87891 Personal history of nicotine dependence: Secondary | ICD-10-CM | POA: Diagnosis not present

## 2019-06-22 DIAGNOSIS — R1310 Dysphagia, unspecified: Secondary | ICD-10-CM | POA: Diagnosis not present

## 2019-06-22 DIAGNOSIS — J988 Other specified respiratory disorders: Secondary | ICD-10-CM | POA: Diagnosis not present

## 2019-06-22 DIAGNOSIS — C099 Malignant neoplasm of tonsil, unspecified: Secondary | ICD-10-CM | POA: Diagnosis not present

## 2019-06-22 DIAGNOSIS — M272 Inflammatory conditions of jaws: Secondary | ICD-10-CM | POA: Diagnosis not present

## 2019-06-22 DIAGNOSIS — Y842 Radiological procedure and radiotherapy as the cause of abnormal reaction of the patient, or of later complication, without mention of misadventure at the time of the procedure: Secondary | ICD-10-CM | POA: Diagnosis not present

## 2019-06-30 DIAGNOSIS — E785 Hyperlipidemia, unspecified: Secondary | ICD-10-CM | POA: Diagnosis not present

## 2019-06-30 DIAGNOSIS — Z930 Tracheostomy status: Secondary | ICD-10-CM | POA: Diagnosis not present

## 2019-06-30 DIAGNOSIS — Z85818 Personal history of malignant neoplasm of other sites of lip, oral cavity, and pharynx: Secondary | ICD-10-CM | POA: Diagnosis not present

## 2019-06-30 DIAGNOSIS — M272 Inflammatory conditions of jaws: Secondary | ICD-10-CM | POA: Diagnosis not present

## 2019-06-30 DIAGNOSIS — E039 Hypothyroidism, unspecified: Secondary | ICD-10-CM | POA: Diagnosis not present

## 2019-06-30 DIAGNOSIS — M436 Torticollis: Secondary | ICD-10-CM | POA: Diagnosis not present

## 2019-06-30 DIAGNOSIS — M898X8 Other specified disorders of bone, other site: Secondary | ICD-10-CM | POA: Diagnosis not present

## 2019-06-30 DIAGNOSIS — Z9889 Other specified postprocedural states: Secondary | ICD-10-CM | POA: Diagnosis not present

## 2019-06-30 DIAGNOSIS — Z8589 Personal history of malignant neoplasm of other organs and systems: Secondary | ICD-10-CM | POA: Diagnosis not present

## 2019-06-30 DIAGNOSIS — I10 Essential (primary) hypertension: Secondary | ICD-10-CM | POA: Diagnosis not present

## 2019-06-30 DIAGNOSIS — J398 Other specified diseases of upper respiratory tract: Secondary | ICD-10-CM | POA: Diagnosis not present

## 2019-06-30 DIAGNOSIS — I890 Lymphedema, not elsewhere classified: Secondary | ICD-10-CM | POA: Diagnosis not present

## 2019-06-30 DIAGNOSIS — M8788 Other osteonecrosis, other site: Secondary | ICD-10-CM | POA: Diagnosis not present

## 2019-06-30 DIAGNOSIS — Y842 Radiological procedure and radiotherapy as the cause of abnormal reaction of the patient, or of later complication, without mention of misadventure at the time of the procedure: Secondary | ICD-10-CM | POA: Diagnosis not present

## 2019-06-30 DIAGNOSIS — Z931 Gastrostomy status: Secondary | ICD-10-CM | POA: Diagnosis not present

## 2019-06-30 DIAGNOSIS — L598 Other specified disorders of the skin and subcutaneous tissue related to radiation: Secondary | ICD-10-CM | POA: Diagnosis not present

## 2019-06-30 DIAGNOSIS — Z20822 Contact with and (suspected) exposure to covid-19: Secondary | ICD-10-CM | POA: Diagnosis not present

## 2019-06-30 DIAGNOSIS — Z923 Personal history of irradiation: Secondary | ICD-10-CM | POA: Diagnosis not present

## 2019-06-30 DIAGNOSIS — Z01812 Encounter for preprocedural laboratory examination: Secondary | ICD-10-CM | POA: Diagnosis not present

## 2019-06-30 DIAGNOSIS — R1310 Dysphagia, unspecified: Secondary | ICD-10-CM | POA: Diagnosis not present

## 2019-06-30 DIAGNOSIS — F329 Major depressive disorder, single episode, unspecified: Secondary | ICD-10-CM | POA: Diagnosis not present

## 2019-06-30 DIAGNOSIS — Z87891 Personal history of nicotine dependence: Secondary | ICD-10-CM | POA: Diagnosis not present

## 2019-07-03 DIAGNOSIS — M868X8 Other osteomyelitis, other site: Secondary | ICD-10-CM | POA: Diagnosis not present

## 2019-07-03 DIAGNOSIS — Z9889 Other specified postprocedural states: Secondary | ICD-10-CM | POA: Diagnosis not present

## 2019-07-03 DIAGNOSIS — M8788 Other osteonecrosis, other site: Secondary | ICD-10-CM | POA: Diagnosis not present

## 2019-07-03 DIAGNOSIS — Z85818 Personal history of malignant neoplasm of other sites of lip, oral cavity, and pharynx: Secondary | ICD-10-CM | POA: Diagnosis not present

## 2019-07-03 DIAGNOSIS — M8708 Idiopathic aseptic necrosis of bone, other site: Secondary | ICD-10-CM | POA: Diagnosis not present

## 2019-07-03 DIAGNOSIS — M272 Inflammatory conditions of jaws: Secondary | ICD-10-CM | POA: Diagnosis not present

## 2019-07-03 DIAGNOSIS — Y842 Radiological procedure and radiotherapy as the cause of abnormal reaction of the patient, or of later complication, without mention of misadventure at the time of the procedure: Secondary | ICD-10-CM | POA: Diagnosis not present

## 2019-07-03 DIAGNOSIS — E785 Hyperlipidemia, unspecified: Secondary | ICD-10-CM | POA: Diagnosis not present

## 2019-07-03 DIAGNOSIS — Z930 Tracheostomy status: Secondary | ICD-10-CM | POA: Diagnosis not present

## 2019-07-03 DIAGNOSIS — M8448XA Pathological fracture, other site, initial encounter for fracture: Secondary | ICD-10-CM | POA: Diagnosis not present

## 2019-07-03 DIAGNOSIS — I10 Essential (primary) hypertension: Secondary | ICD-10-CM | POA: Diagnosis not present

## 2019-07-20 DIAGNOSIS — D693 Immune thrombocytopenic purpura: Secondary | ICD-10-CM | POA: Diagnosis not present

## 2019-07-20 DIAGNOSIS — C090 Malignant neoplasm of tonsillar fossa: Secondary | ICD-10-CM | POA: Diagnosis not present

## 2019-07-20 DIAGNOSIS — D649 Anemia, unspecified: Secondary | ICD-10-CM | POA: Diagnosis not present

## 2019-07-20 DIAGNOSIS — Z79899 Other long term (current) drug therapy: Secondary | ICD-10-CM | POA: Diagnosis not present

## 2019-07-20 DIAGNOSIS — D696 Thrombocytopenia, unspecified: Secondary | ICD-10-CM | POA: Diagnosis not present

## 2019-07-26 DIAGNOSIS — M272 Inflammatory conditions of jaws: Secondary | ICD-10-CM | POA: Diagnosis not present

## 2019-07-26 DIAGNOSIS — L98499 Non-pressure chronic ulcer of skin of other sites with unspecified severity: Secondary | ICD-10-CM | POA: Diagnosis not present

## 2019-07-28 DIAGNOSIS — M272 Inflammatory conditions of jaws: Secondary | ICD-10-CM | POA: Diagnosis not present

## 2019-07-28 DIAGNOSIS — Z8589 Personal history of malignant neoplasm of other organs and systems: Secondary | ICD-10-CM | POA: Diagnosis not present

## 2019-07-28 DIAGNOSIS — Z923 Personal history of irradiation: Secondary | ICD-10-CM | POA: Diagnosis not present

## 2019-08-11 DIAGNOSIS — R031 Nonspecific low blood-pressure reading: Secondary | ICD-10-CM | POA: Diagnosis not present

## 2019-08-11 DIAGNOSIS — M272 Inflammatory conditions of jaws: Secondary | ICD-10-CM | POA: Diagnosis not present

## 2019-08-11 DIAGNOSIS — B954 Other streptococcus as the cause of diseases classified elsewhere: Secondary | ICD-10-CM | POA: Diagnosis not present

## 2019-08-11 DIAGNOSIS — Z792 Long term (current) use of antibiotics: Secondary | ICD-10-CM | POA: Diagnosis not present

## 2019-08-11 DIAGNOSIS — B952 Enterococcus as the cause of diseases classified elsewhere: Secondary | ICD-10-CM | POA: Diagnosis not present

## 2019-08-11 DIAGNOSIS — B9689 Other specified bacterial agents as the cause of diseases classified elsewhere: Secondary | ICD-10-CM | POA: Diagnosis not present

## 2019-08-11 DIAGNOSIS — B957 Other staphylococcus as the cause of diseases classified elsewhere: Secondary | ICD-10-CM | POA: Diagnosis not present

## 2019-08-16 DIAGNOSIS — Z931 Gastrostomy status: Secondary | ICD-10-CM | POA: Diagnosis not present

## 2019-08-16 DIAGNOSIS — C099 Malignant neoplasm of tonsil, unspecified: Secondary | ICD-10-CM | POA: Diagnosis not present

## 2019-08-16 DIAGNOSIS — Z9889 Other specified postprocedural states: Secondary | ICD-10-CM | POA: Diagnosis not present

## 2019-08-16 DIAGNOSIS — Z923 Personal history of irradiation: Secondary | ICD-10-CM | POA: Diagnosis not present

## 2019-08-16 DIAGNOSIS — Z930 Tracheostomy status: Secondary | ICD-10-CM | POA: Diagnosis not present

## 2019-08-16 DIAGNOSIS — Z87891 Personal history of nicotine dependence: Secondary | ICD-10-CM | POA: Diagnosis not present

## 2019-08-22 DIAGNOSIS — M278 Other specified diseases of jaws: Secondary | ICD-10-CM | POA: Diagnosis not present

## 2019-08-22 DIAGNOSIS — C098 Malignant neoplasm of overlapping sites of tonsil: Secondary | ICD-10-CM | POA: Diagnosis not present

## 2019-08-25 DIAGNOSIS — J439 Emphysema, unspecified: Secondary | ICD-10-CM | POA: Diagnosis not present

## 2019-08-25 DIAGNOSIS — M272 Inflammatory conditions of jaws: Secondary | ICD-10-CM | POA: Diagnosis not present

## 2019-09-01 DIAGNOSIS — Z792 Long term (current) use of antibiotics: Secondary | ICD-10-CM | POA: Diagnosis not present

## 2019-09-01 DIAGNOSIS — Y842 Radiological procedure and radiotherapy as the cause of abnormal reaction of the patient, or of later complication, without mention of misadventure at the time of the procedure: Secondary | ICD-10-CM | POA: Diagnosis not present

## 2019-09-01 DIAGNOSIS — M272 Inflammatory conditions of jaws: Secondary | ICD-10-CM | POA: Diagnosis not present

## 2019-09-01 DIAGNOSIS — M869 Osteomyelitis, unspecified: Secondary | ICD-10-CM | POA: Diagnosis not present

## 2019-09-06 DIAGNOSIS — M272 Inflammatory conditions of jaws: Secondary | ICD-10-CM | POA: Diagnosis not present

## 2019-09-07 DIAGNOSIS — M272 Inflammatory conditions of jaws: Secondary | ICD-10-CM | POA: Diagnosis not present

## 2019-09-11 DIAGNOSIS — M272 Inflammatory conditions of jaws: Secondary | ICD-10-CM | POA: Diagnosis not present

## 2019-09-13 DIAGNOSIS — M272 Inflammatory conditions of jaws: Secondary | ICD-10-CM | POA: Diagnosis not present

## 2019-09-14 DIAGNOSIS — M272 Inflammatory conditions of jaws: Secondary | ICD-10-CM | POA: Diagnosis not present

## 2019-09-18 DIAGNOSIS — M272 Inflammatory conditions of jaws: Secondary | ICD-10-CM | POA: Diagnosis not present

## 2019-09-18 DIAGNOSIS — M278 Other specified diseases of jaws: Secondary | ICD-10-CM | POA: Diagnosis not present

## 2019-09-18 DIAGNOSIS — C098 Malignant neoplasm of overlapping sites of tonsil: Secondary | ICD-10-CM | POA: Diagnosis not present

## 2019-09-19 DIAGNOSIS — M272 Inflammatory conditions of jaws: Secondary | ICD-10-CM | POA: Diagnosis not present

## 2019-09-19 DIAGNOSIS — C008 Malignant neoplasm of overlapping sites of lip: Secondary | ICD-10-CM | POA: Diagnosis not present

## 2019-09-20 DIAGNOSIS — M272 Inflammatory conditions of jaws: Secondary | ICD-10-CM | POA: Diagnosis not present

## 2019-09-21 DIAGNOSIS — M272 Inflammatory conditions of jaws: Secondary | ICD-10-CM | POA: Diagnosis not present

## 2019-09-25 DIAGNOSIS — M272 Inflammatory conditions of jaws: Secondary | ICD-10-CM | POA: Diagnosis not present

## 2019-09-26 DIAGNOSIS — M272 Inflammatory conditions of jaws: Secondary | ICD-10-CM | POA: Diagnosis not present

## 2019-09-27 DIAGNOSIS — M272 Inflammatory conditions of jaws: Secondary | ICD-10-CM | POA: Diagnosis not present

## 2019-09-28 DIAGNOSIS — M272 Inflammatory conditions of jaws: Secondary | ICD-10-CM | POA: Diagnosis not present

## 2019-10-02 DIAGNOSIS — M272 Inflammatory conditions of jaws: Secondary | ICD-10-CM | POA: Diagnosis not present

## 2019-10-03 DIAGNOSIS — M272 Inflammatory conditions of jaws: Secondary | ICD-10-CM | POA: Diagnosis not present

## 2019-10-04 DIAGNOSIS — M272 Inflammatory conditions of jaws: Secondary | ICD-10-CM | POA: Diagnosis not present

## 2019-10-05 DIAGNOSIS — M272 Inflammatory conditions of jaws: Secondary | ICD-10-CM | POA: Diagnosis not present

## 2019-10-06 DIAGNOSIS — M272 Inflammatory conditions of jaws: Secondary | ICD-10-CM | POA: Diagnosis not present

## 2019-10-09 DIAGNOSIS — M272 Inflammatory conditions of jaws: Secondary | ICD-10-CM | POA: Diagnosis not present

## 2019-10-10 DIAGNOSIS — M272 Inflammatory conditions of jaws: Secondary | ICD-10-CM | POA: Diagnosis not present

## 2019-10-11 DIAGNOSIS — M272 Inflammatory conditions of jaws: Secondary | ICD-10-CM | POA: Diagnosis not present

## 2019-10-12 DIAGNOSIS — M272 Inflammatory conditions of jaws: Secondary | ICD-10-CM | POA: Diagnosis not present

## 2019-10-13 DIAGNOSIS — M272 Inflammatory conditions of jaws: Secondary | ICD-10-CM | POA: Diagnosis not present

## 2019-10-17 DIAGNOSIS — M272 Inflammatory conditions of jaws: Secondary | ICD-10-CM | POA: Diagnosis not present

## 2019-10-18 DIAGNOSIS — M272 Inflammatory conditions of jaws: Secondary | ICD-10-CM | POA: Diagnosis not present

## 2019-10-19 DIAGNOSIS — M272 Inflammatory conditions of jaws: Secondary | ICD-10-CM | POA: Diagnosis not present

## 2019-10-20 DIAGNOSIS — M272 Inflammatory conditions of jaws: Secondary | ICD-10-CM | POA: Diagnosis not present

## 2019-10-23 DIAGNOSIS — M272 Inflammatory conditions of jaws: Secondary | ICD-10-CM | POA: Diagnosis not present

## 2019-10-25 DIAGNOSIS — M272 Inflammatory conditions of jaws: Secondary | ICD-10-CM | POA: Diagnosis not present

## 2019-10-26 DIAGNOSIS — M272 Inflammatory conditions of jaws: Secondary | ICD-10-CM | POA: Diagnosis not present

## 2019-11-15 DIAGNOSIS — E7800 Pure hypercholesterolemia, unspecified: Secondary | ICD-10-CM | POA: Diagnosis not present

## 2019-11-15 DIAGNOSIS — F329 Major depressive disorder, single episode, unspecified: Secondary | ICD-10-CM | POA: Diagnosis not present

## 2019-11-30 DIAGNOSIS — D649 Anemia, unspecified: Secondary | ICD-10-CM | POA: Diagnosis not present

## 2020-01-19 DIAGNOSIS — Z79899 Other long term (current) drug therapy: Secondary | ICD-10-CM | POA: Diagnosis not present

## 2020-01-19 DIAGNOSIS — D696 Thrombocytopenia, unspecified: Secondary | ICD-10-CM | POA: Diagnosis not present

## 2020-01-19 DIAGNOSIS — C090 Malignant neoplasm of tonsillar fossa: Secondary | ICD-10-CM | POA: Diagnosis not present

## 2020-01-19 DIAGNOSIS — D693 Immune thrombocytopenic purpura: Secondary | ICD-10-CM | POA: Diagnosis not present

## 2020-01-19 DIAGNOSIS — Z85818 Personal history of malignant neoplasm of other sites of lip, oral cavity, and pharynx: Secondary | ICD-10-CM | POA: Diagnosis not present

## 2020-01-19 DIAGNOSIS — D649 Anemia, unspecified: Secondary | ICD-10-CM | POA: Diagnosis not present

## 2020-02-22 DIAGNOSIS — I110 Hypertensive heart disease with heart failure: Secondary | ICD-10-CM | POA: Diagnosis not present

## 2020-02-22 DIAGNOSIS — I5021 Acute systolic (congestive) heart failure: Secondary | ICD-10-CM | POA: Diagnosis not present

## 2020-02-22 DIAGNOSIS — I517 Cardiomegaly: Secondary | ICD-10-CM | POA: Diagnosis not present

## 2020-02-22 DIAGNOSIS — Z23 Encounter for immunization: Secondary | ICD-10-CM | POA: Diagnosis not present

## 2020-02-22 DIAGNOSIS — I444 Left anterior fascicular block: Secondary | ICD-10-CM | POA: Diagnosis not present

## 2020-02-22 DIAGNOSIS — I5022 Chronic systolic (congestive) heart failure: Secondary | ICD-10-CM | POA: Diagnosis not present

## 2020-03-07 DIAGNOSIS — Z6828 Body mass index (BMI) 28.0-28.9, adult: Secondary | ICD-10-CM | POA: Diagnosis not present

## 2020-03-07 DIAGNOSIS — L308 Other specified dermatitis: Secondary | ICD-10-CM | POA: Diagnosis not present

## 2020-03-25 DIAGNOSIS — R233 Spontaneous ecchymoses: Secondary | ICD-10-CM | POA: Diagnosis not present

## 2020-03-25 DIAGNOSIS — K9420 Gastrostomy complication, unspecified: Secondary | ICD-10-CM | POA: Diagnosis not present

## 2020-03-25 DIAGNOSIS — L300 Nummular dermatitis: Secondary | ICD-10-CM | POA: Diagnosis not present

## 2020-03-25 DIAGNOSIS — L820 Inflamed seborrheic keratosis: Secondary | ICD-10-CM | POA: Diagnosis not present

## 2020-03-25 DIAGNOSIS — Z4682 Encounter for fitting and adjustment of non-vascular catheter: Secondary | ICD-10-CM | POA: Diagnosis not present

## 2020-05-01 DIAGNOSIS — I890 Lymphedema, not elsewhere classified: Secondary | ICD-10-CM | POA: Diagnosis not present

## 2020-05-01 DIAGNOSIS — M272 Inflammatory conditions of jaws: Secondary | ICD-10-CM | POA: Diagnosis not present

## 2020-05-01 DIAGNOSIS — C099 Malignant neoplasm of tonsil, unspecified: Secondary | ICD-10-CM | POA: Diagnosis not present

## 2020-05-01 DIAGNOSIS — R29898 Other symptoms and signs involving the musculoskeletal system: Secondary | ICD-10-CM | POA: Diagnosis not present

## 2020-05-01 DIAGNOSIS — Z9889 Other specified postprocedural states: Secondary | ICD-10-CM | POA: Diagnosis not present

## 2020-05-01 DIAGNOSIS — Z930 Tracheostomy status: Secondary | ICD-10-CM | POA: Diagnosis not present

## 2020-05-01 DIAGNOSIS — Y842 Radiological procedure and radiotherapy as the cause of abnormal reaction of the patient, or of later complication, without mention of misadventure at the time of the procedure: Secondary | ICD-10-CM | POA: Diagnosis not present

## 2020-05-21 DIAGNOSIS — R490 Dysphonia: Secondary | ICD-10-CM | POA: Diagnosis not present

## 2020-06-12 DIAGNOSIS — Z8719 Personal history of other diseases of the digestive system: Secondary | ICD-10-CM | POA: Diagnosis not present

## 2020-06-12 DIAGNOSIS — I10 Essential (primary) hypertension: Secondary | ICD-10-CM | POA: Diagnosis not present

## 2020-06-12 DIAGNOSIS — Z87891 Personal history of nicotine dependence: Secondary | ICD-10-CM | POA: Diagnosis not present

## 2020-06-12 DIAGNOSIS — D693 Immune thrombocytopenic purpura: Secondary | ICD-10-CM | POA: Diagnosis not present

## 2020-06-12 DIAGNOSIS — K8020 Calculus of gallbladder without cholecystitis without obstruction: Secondary | ICD-10-CM | POA: Diagnosis not present

## 2020-06-12 DIAGNOSIS — Z79899 Other long term (current) drug therapy: Secondary | ICD-10-CM | POA: Diagnosis not present

## 2020-06-12 DIAGNOSIS — J9503 Malfunction of tracheostomy stoma: Secondary | ICD-10-CM | POA: Diagnosis not present

## 2020-06-12 DIAGNOSIS — Z430 Encounter for attention to tracheostomy: Secondary | ICD-10-CM | POA: Diagnosis not present

## 2020-06-12 DIAGNOSIS — Z9981 Dependence on supplemental oxygen: Secondary | ICD-10-CM | POA: Diagnosis not present

## 2020-06-12 DIAGNOSIS — J40 Bronchitis, not specified as acute or chronic: Secondary | ICD-10-CM | POA: Diagnosis present

## 2020-06-12 DIAGNOSIS — E039 Hypothyroidism, unspecified: Secondary | ICD-10-CM | POA: Diagnosis present

## 2020-06-12 DIAGNOSIS — E785 Hyperlipidemia, unspecified: Secondary | ICD-10-CM | POA: Diagnosis present

## 2020-06-12 DIAGNOSIS — J392 Other diseases of pharynx: Secondary | ICD-10-CM | POA: Diagnosis not present

## 2020-06-12 DIAGNOSIS — Z930 Tracheostomy status: Secondary | ICD-10-CM | POA: Diagnosis not present

## 2020-06-12 DIAGNOSIS — Z85819 Personal history of malignant neoplasm of unspecified site of lip, oral cavity, and pharynx: Secondary | ICD-10-CM | POA: Diagnosis not present

## 2020-06-12 DIAGNOSIS — Z85818 Personal history of malignant neoplasm of other sites of lip, oral cavity, and pharynx: Secondary | ICD-10-CM | POA: Diagnosis not present

## 2020-06-12 DIAGNOSIS — F32A Depression, unspecified: Secondary | ICD-10-CM | POA: Diagnosis present

## 2020-06-12 DIAGNOSIS — R042 Hemoptysis: Secondary | ICD-10-CM | POA: Diagnosis not present

## 2020-06-12 DIAGNOSIS — D62 Acute posthemorrhagic anemia: Secondary | ICD-10-CM | POA: Diagnosis not present

## 2020-06-12 DIAGNOSIS — K110 Atrophy of salivary gland: Secondary | ICD-10-CM | POA: Diagnosis not present

## 2020-06-13 DIAGNOSIS — D693 Immune thrombocytopenic purpura: Secondary | ICD-10-CM | POA: Diagnosis not present

## 2020-06-13 DIAGNOSIS — J9503 Malfunction of tracheostomy stoma: Secondary | ICD-10-CM | POA: Diagnosis not present

## 2020-06-13 DIAGNOSIS — D62 Acute posthemorrhagic anemia: Secondary | ICD-10-CM | POA: Diagnosis not present

## 2020-06-13 DIAGNOSIS — Z9981 Dependence on supplemental oxygen: Secondary | ICD-10-CM | POA: Diagnosis not present

## 2020-06-13 DIAGNOSIS — Z930 Tracheostomy status: Secondary | ICD-10-CM | POA: Diagnosis not present

## 2020-06-13 DIAGNOSIS — R042 Hemoptysis: Secondary | ICD-10-CM | POA: Diagnosis not present

## 2020-06-14 ENCOUNTER — Other Ambulatory Visit: Payer: Self-pay | Admitting: Hematology and Oncology

## 2020-06-14 ENCOUNTER — Other Ambulatory Visit: Payer: Self-pay | Admitting: Oncology

## 2020-06-14 ENCOUNTER — Other Ambulatory Visit: Payer: Self-pay

## 2020-06-14 ENCOUNTER — Telehealth: Payer: Self-pay

## 2020-06-14 ENCOUNTER — Encounter: Payer: Self-pay | Admitting: Oncology

## 2020-06-14 ENCOUNTER — Inpatient Hospital Stay: Payer: Medicare Other | Attending: Oncology

## 2020-06-14 DIAGNOSIS — E039 Hypothyroidism, unspecified: Secondary | ICD-10-CM | POA: Diagnosis present

## 2020-06-14 DIAGNOSIS — D649 Anemia, unspecified: Secondary | ICD-10-CM | POA: Insufficient documentation

## 2020-06-14 DIAGNOSIS — Z85818 Personal history of malignant neoplasm of other sites of lip, oral cavity, and pharynx: Secondary | ICD-10-CM | POA: Diagnosis not present

## 2020-06-14 DIAGNOSIS — I10 Essential (primary) hypertension: Secondary | ICD-10-CM | POA: Diagnosis present

## 2020-06-14 DIAGNOSIS — C090 Malignant neoplasm of tonsillar fossa: Secondary | ICD-10-CM | POA: Insufficient documentation

## 2020-06-14 DIAGNOSIS — D696 Thrombocytopenia, unspecified: Secondary | ICD-10-CM | POA: Diagnosis not present

## 2020-06-14 DIAGNOSIS — K8020 Calculus of gallbladder without cholecystitis without obstruction: Secondary | ICD-10-CM | POA: Diagnosis not present

## 2020-06-14 DIAGNOSIS — Z930 Tracheostomy status: Secondary | ICD-10-CM | POA: Diagnosis not present

## 2020-06-14 DIAGNOSIS — I959 Hypotension, unspecified: Secondary | ICD-10-CM | POA: Diagnosis not present

## 2020-06-14 DIAGNOSIS — D693 Immune thrombocytopenic purpura: Secondary | ICD-10-CM | POA: Diagnosis not present

## 2020-06-14 DIAGNOSIS — Z79899 Other long term (current) drug therapy: Secondary | ICD-10-CM | POA: Insufficient documentation

## 2020-06-14 DIAGNOSIS — F32A Depression, unspecified: Secondary | ICD-10-CM | POA: Diagnosis present

## 2020-06-14 DIAGNOSIS — R042 Hemoptysis: Secondary | ICD-10-CM | POA: Diagnosis not present

## 2020-06-14 DIAGNOSIS — K5730 Diverticulosis of large intestine without perforation or abscess without bleeding: Secondary | ICD-10-CM | POA: Diagnosis not present

## 2020-06-14 DIAGNOSIS — D62 Acute posthemorrhagic anemia: Secondary | ICD-10-CM | POA: Diagnosis not present

## 2020-06-14 DIAGNOSIS — Z5111 Encounter for antineoplastic chemotherapy: Secondary | ICD-10-CM | POA: Diagnosis not present

## 2020-06-14 DIAGNOSIS — Z85819 Personal history of malignant neoplasm of unspecified site of lip, oral cavity, and pharynx: Secondary | ICD-10-CM | POA: Diagnosis not present

## 2020-06-14 DIAGNOSIS — E785 Hyperlipidemia, unspecified: Secondary | ICD-10-CM | POA: Diagnosis present

## 2020-06-14 DIAGNOSIS — Z923 Personal history of irradiation: Secondary | ICD-10-CM | POA: Diagnosis not present

## 2020-06-14 DIAGNOSIS — M4186 Other forms of scoliosis, lumbar region: Secondary | ICD-10-CM | POA: Diagnosis not present

## 2020-06-14 DIAGNOSIS — Z87891 Personal history of nicotine dependence: Secondary | ICD-10-CM | POA: Diagnosis not present

## 2020-06-14 HISTORY — DX: Immune thrombocytopenic purpura: D69.3

## 2020-06-14 LAB — CBC AND DIFFERENTIAL
HCT: 33 — AB (ref 41–53)
Hemoglobin: 10.9 — AB (ref 13.5–17.5)
Neutrophils Absolute: 19.98
Platelets: 6 — AB (ref 150–399)
WBC: 22.2

## 2020-06-14 LAB — CBC
MCV: 96 — AB (ref 80–94)
RBC: 3.41 — AB (ref 3.87–5.11)

## 2020-06-14 NOTE — Telephone Encounter (Signed)
Denise in Hematology called with Critical platelet count: 6. Dr. Bobby Rumpf made aware.

## 2020-06-17 ENCOUNTER — Other Ambulatory Visit: Payer: Self-pay | Admitting: Oncology

## 2020-06-17 ENCOUNTER — Telehealth: Payer: Self-pay | Admitting: Oncology

## 2020-06-17 ENCOUNTER — Other Ambulatory Visit: Payer: Self-pay | Admitting: Hematology and Oncology

## 2020-06-17 ENCOUNTER — Other Ambulatory Visit: Payer: Self-pay

## 2020-06-17 ENCOUNTER — Inpatient Hospital Stay: Payer: Medicare Other

## 2020-06-17 ENCOUNTER — Encounter: Payer: Self-pay | Admitting: Oncology

## 2020-06-17 VITALS — BP 131/117 | HR 56 | Temp 98.0°F | Resp 20 | Ht 68.0 in | Wt 187.2 lb

## 2020-06-17 DIAGNOSIS — D693 Immune thrombocytopenic purpura: Secondary | ICD-10-CM

## 2020-06-17 DIAGNOSIS — I959 Hypotension, unspecified: Secondary | ICD-10-CM

## 2020-06-17 DIAGNOSIS — Z5111 Encounter for antineoplastic chemotherapy: Secondary | ICD-10-CM | POA: Diagnosis not present

## 2020-06-17 DIAGNOSIS — D649 Anemia, unspecified: Secondary | ICD-10-CM | POA: Diagnosis not present

## 2020-06-17 DIAGNOSIS — C090 Malignant neoplasm of tonsillar fossa: Secondary | ICD-10-CM | POA: Diagnosis not present

## 2020-06-17 DIAGNOSIS — D696 Thrombocytopenia, unspecified: Secondary | ICD-10-CM | POA: Diagnosis not present

## 2020-06-17 DIAGNOSIS — Z79899 Other long term (current) drug therapy: Secondary | ICD-10-CM | POA: Diagnosis not present

## 2020-06-17 LAB — CBC: RBC: 2.79 — AB (ref 3.87–5.11)

## 2020-06-17 LAB — COMPREHENSIVE METABOLIC PANEL: Calcium: 8.1 — AB (ref 8.7–10.7)

## 2020-06-17 LAB — CBC AND DIFFERENTIAL
HCT: 27 — AB (ref 41–53)
Hemoglobin: 9 — AB (ref 13.5–17.5)
Neutrophils Absolute: 11.48
Platelets: 42 — AB (ref 150–399)
WBC: 13.2

## 2020-06-17 LAB — BASIC METABOLIC PANEL
BUN: 29 — AB (ref 4–21)
CO2: 28 — AB (ref 13–22)
Chloride: 96 — AB (ref 99–108)
Creatinine: 0.5 — AB (ref 0.6–1.3)
Glucose: 139
Potassium: 3.4 (ref 3.4–5.3)
Sodium: 133 — AB (ref 137–147)

## 2020-06-17 MED ORDER — DIPHENHYDRAMINE HCL 50 MG/ML IJ SOLN
INTRAMUSCULAR | Status: AC
  Filled 2020-06-17: qty 1

## 2020-06-17 MED ORDER — SODIUM CHLORIDE 0.9 % IV SOLN
Freq: Once | INTRAVENOUS | Status: AC
  Filled 2020-06-17: qty 250

## 2020-06-17 MED ORDER — SODIUM CHLORIDE 0.9 % IV SOLN
375.0000 mg/m2 | Freq: Once | INTRAVENOUS | Status: AC
  Administered 2020-06-17: 700 mg via INTRAVENOUS
  Filled 2020-06-17: qty 50

## 2020-06-17 MED ORDER — ACETAMINOPHEN 325 MG PO TABS
650.0000 mg | ORAL_TABLET | Freq: Once | ORAL | Status: AC
  Administered 2020-06-17: 650 mg via ORAL

## 2020-06-17 MED ORDER — DIPHENHYDRAMINE HCL 50 MG/ML IJ SOLN
50.0000 mg | Freq: Once | INTRAMUSCULAR | Status: AC
  Administered 2020-06-17: 50 mg via INTRAVENOUS

## 2020-06-17 MED ORDER — ACETAMINOPHEN 325 MG PO TABS
ORAL_TABLET | ORAL | Status: AC
  Filled 2020-06-17: qty 2

## 2020-06-17 NOTE — Patient Instructions (Signed)
[facility] Discharge Instructions for Patients Receiving Chemotherapy  Today you received the following chemotherapy agents Rituximab  To help prevent nausea and vomiting after your treatment, we encourage you to take your nausea medication as directed.   If you develop nausea and vomiting that is not controlled by your nausea medication, call the clinic.   BELOW ARE SYMPTOMS THAT SHOULD BE REPORTED IMMEDIATELY:  *FEVER GREATER THAN 100.5 F  *CHILLS WITH OR WITHOUT FEVER  NAUSEA AND VOMITING THAT IS NOT CONTROLLED WITH YOUR NAUSEA MEDICATION  *UNUSUAL SHORTNESS OF BREATH  *UNUSUAL BRUISING OR BLEEDING  TENDERNESS IN MOUTH AND THROAT WITH OR WITHOUT PRESENCE OF ULCERS  *URINARY PROBLEMS  *BOWEL PROBLEMS  UNUSUAL RASH Items with * indicate a potential emergency and should be followed up as soon as possible.  Feel free to call the clinic should you have any questions or concerns at The clinic phone number is [phone].  Please show the [facility] at check-in to the Emergency Department and triage nurse.

## 2020-06-17 NOTE — Progress Notes (Signed)
Vs reported to Tammy/Dr Bobby Rumpf. Heart rate continues elevated  and b/p elevated as well. Patient is asymptomatic of either. He put his lopressor and losartan through his peg tube home meds. Rituxan started as directed. 1129: Hepatitis panel drawn from left arm sent to [facility]. 1459: Discharged in stable condition.

## 2020-06-17 NOTE — Progress Notes (Signed)
PT STABLE AT TIME OF DISCHARGE 

## 2020-06-17 NOTE — Telephone Encounter (Signed)
06/17/20 Spoke with Dr Bobby Rumpf and sched patient for next appts

## 2020-06-18 ENCOUNTER — Other Ambulatory Visit: Payer: Self-pay | Admitting: Hematology and Oncology

## 2020-06-18 ENCOUNTER — Telehealth: Payer: Self-pay

## 2020-06-18 ENCOUNTER — Inpatient Hospital Stay: Payer: Medicare Other | Attending: Oncology

## 2020-06-18 DIAGNOSIS — Z5112 Encounter for antineoplastic immunotherapy: Secondary | ICD-10-CM | POA: Insufficient documentation

## 2020-06-18 DIAGNOSIS — D693 Immune thrombocytopenic purpura: Secondary | ICD-10-CM

## 2020-06-18 DIAGNOSIS — D649 Anemia, unspecified: Secondary | ICD-10-CM | POA: Diagnosis not present

## 2020-06-18 LAB — CBC AND DIFFERENTIAL
HCT: 28 — AB (ref 41–53)
Hemoglobin: 9.5 — AB (ref 13.5–17.5)
Neutrophils Absolute: 9.37
Platelets: 6 — AB (ref 150–399)
WBC: 10.9

## 2020-06-18 LAB — HEPATITIS B CORE ANTIBODY, TOTAL: Hep B Core Total Ab: REACTIVE — AB

## 2020-06-18 LAB — CBC
MCV: 96 (ref 76–111)
RBC: 2.9 — AB (ref 3.87–5.11)

## 2020-06-18 LAB — HEPATITIS B SURFACE ANTIGEN: Hepatitis B Surface Ag: NONREACTIVE

## 2020-06-18 NOTE — Telephone Encounter (Signed)
I called pt to see how he did through the night after Rituxan infusion yesterday. Pt denies N/V, skin rashes, & diarrhea. Pt was in [facility] when I called him, receiving platelets. He said he come in and had labs drawn this morning, then was sent up for platelets. Reminded pt to call us if temp over 100.4 day or night @ [phone], or with any other questions/concerns. Pt verbalized understanding.

## 2020-06-18 NOTE — Progress Notes (Signed)
Dr. Bobby Rumpf wants to start patient on Promacta, ASAP. He sent in a Rx to [facility], and I had patient sign paperwork for free medication through [employer]. I faxed this in to them today. We also sent in a request to [facility] to get a 15 count supply for patient, so he can get started on something before the Promacta gets approved.

## 2020-06-19 ENCOUNTER — Other Ambulatory Visit: Payer: Self-pay | Admitting: Pharmacist

## 2020-06-19 DIAGNOSIS — S0990XA Unspecified injury of head, initial encounter: Secondary | ICD-10-CM | POA: Diagnosis not present

## 2020-06-19 DIAGNOSIS — R42 Dizziness and giddiness: Secondary | ICD-10-CM | POA: Diagnosis not present

## 2020-06-19 DIAGNOSIS — R4020 Unspecified coma: Secondary | ICD-10-CM | POA: Diagnosis not present

## 2020-06-19 DIAGNOSIS — I951 Orthostatic hypotension: Secondary | ICD-10-CM | POA: Diagnosis not present

## 2020-06-19 DIAGNOSIS — C090 Malignant neoplasm of tonsillar fossa: Secondary | ICD-10-CM

## 2020-06-19 DIAGNOSIS — D696 Thrombocytopenia, unspecified: Secondary | ICD-10-CM | POA: Diagnosis not present

## 2020-06-19 DIAGNOSIS — W19XXXA Unspecified fall, initial encounter: Secondary | ICD-10-CM | POA: Diagnosis not present

## 2020-06-19 DIAGNOSIS — I959 Hypotension, unspecified: Secondary | ICD-10-CM | POA: Diagnosis not present

## 2020-06-19 DIAGNOSIS — D649 Anemia, unspecified: Secondary | ICD-10-CM | POA: Diagnosis not present

## 2020-06-19 HISTORY — DX: Malignant neoplasm of tonsillar fossa: C09.0

## 2020-06-19 MED FILL — Romiplostim For Inj 125 MCG: SUBCUTANEOUS | Qty: 0.17 | Status: AC

## 2020-06-19 NOTE — Progress Notes (Signed)
[facility]  [address] [place],  [place]  [address] [phone]  Clinic Day:  06/20/2020  Referring physician: Mateo Flow, MD   HISTORY OF PRESENT ILLNESS:  The patient is a 70 y.o. male with splenectomy-refractory ITP.  Despite receiving multiple units of platelets, as well as IVIG and Decadron, his platelets have recently remained below 10.  He also received Rituxan earlier this week, from which his platelets also failed to respond.  He comes in today to reassess his platelets before starting weekly IV Nplate.  Of note, he has also been dealing with atrial fibrillation, which has been intermittently present over the past week.  With respect to his ITP, he has had blood coming from his tracheostomy site.  He has also developed purpura over his forearms.  Understandably, he has been frustrated with the lack of improvement in his platelets despite everything that has been done to treat his ITP.  PHYSICAL EXAM:  Blood pressure 135/68, pulse 93, temperature 98.8 F (37.1 C), resp. rate 16, height 5\' 8"  (1.727 m), weight 185 lb 6.4 oz (84.1 kg), SpO2 96 %. Wt Readings from Last 3 Encounters:  06/20/20 185 lb 6.4 oz (84.1 kg)  06/17/20 187 lb 4 oz (84.9 kg)  06/14/20 182 lb (82.6 kg)   Body mass index is 28.19 kg/m. Performance status (ECOG): 1 - Symptomatic but completely ambulatory Physical Exam Constitutional:      Appearance: Normal appearance. He is not ill-appearing.  HENT:     Nose:     Right Nostril: Epistaxis (mild) present.     Left Nostril: Epistaxis (mild) present.     Mouth/Throat:     Mouth: Mucous membranes are moist.     Pharynx: Oropharynx is clear. No oropharyngeal exudate or posterior oropharyngeal erythema.  Neck:     Trachea: Tracheostomy (blood is trickling from this site) present.  Cardiovascular:     Rate and Rhythm: Normal rate and regular rhythm.     Heart sounds: No murmur heard. No friction rub. No gallop.    Pulmonary:     Effort: Pulmonary effort is normal. No respiratory distress.     Breath sounds: Normal breath sounds. No wheezing, rhonchi or rales.  Chest:  Breasts:     Right: No axillary adenopathy or supraclavicular adenopathy.     Left: No axillary adenopathy or supraclavicular adenopathy.    Abdominal:     General: Bowel sounds are normal. There is no distension.     Palpations: Abdomen is soft. There is no mass.     Tenderness: There is no abdominal tenderness.  Musculoskeletal:        General: No swelling.     Right lower leg: No edema.     Left lower leg: No edema.  Lymphadenopathy:     Cervical: No cervical adenopathy.     Upper Body:     Right upper body: No supraclavicular or axillary adenopathy.     Left upper body: No supraclavicular or axillary adenopathy.     Lower Body: No right inguinal adenopathy. No left inguinal adenopathy.  Skin:    General: Skin is warm.     Coloration: Skin is not jaundiced.     Findings: No lesion or rash.  Neurological:     General: No focal deficit present.     Mental Status: He is alert and oriented to person, place, and time. Mental status is at baseline.     Cranial Nerves: Cranial nerves are  intact.  Psychiatric:        Mood and Affect: Mood normal.        Behavior: Behavior normal.        Thought Content: Thought content normal.     LABS:   CBC Latest Ref Rng & Units 06/18/2020 06/17/2020 06/14/2020  WBC - 10.9 13.2 22.2  Hemoglobin 13.5 - 17.5 9.5(A) 9.0(A) 10.9(A)  Hematocrit 41 - 53 28(A) 27(A) 33(A)  Platelets 150 - 399 6(A) 42(A) 6(A)   CMP Latest Ref Rng & Units 06/17/2020  BUN 4 - 21 29(A)  Creatinine 0.6 - 1.3 0.5(A)  Sodium 137 - 147 133(A)  Potassium 3.4 - 5.3 3.4  Chloride 99 - 108 96(A)  CO2 13 - 22 28(A)  Calcium 8.7 - 10.7 8.1(A)    ASSESSMENT & PLAN:  Assessment/Plan:  A 70 y.o. male with a severe form of splenectomy-refractory ITP that has been recalcitrant to multiple interventions thus far.  As his  platelets are only 6, I will arrange for him to receive another 2 units of platelets this afternoon.  He will also receive Decadron 40 mg IV with it.  He will also start weekly Nplate injections at 1 mcg/kg today.  Due to the severity of his ITP, he will continue with his weekly Rituxan infusions, for which he has 3 left.  His next dose is scheduled for February 7th.  Moving forward, his CBC will be checked twice weekly to see if his platelets are responding to all the aforementioned interventions.  I will see him back in 1 week for repeat clinical assessment.  The patient understands all the plans discussed today and is in agreement with them.     Sayler Mickiewicz Macarthur Critchley, MD

## 2020-06-19 NOTE — Progress Notes (Signed)
Patients co-pay for Promacta was over $2000, patient would not qualify for co-pay assistance at this time. Patient is willing to pay co-pay through [facility]. It was decided to go with nplate, he can get this in office and his insurance would cover. Mr. Schuld is ok with this, as well as Dr. Bobby Rumpf. I will contact [facility] to let them know we will not be needing the Promacta.

## 2020-06-20 ENCOUNTER — Inpatient Hospital Stay: Payer: Medicare Other

## 2020-06-20 ENCOUNTER — Telehealth: Payer: Self-pay | Admitting: Oncology

## 2020-06-20 ENCOUNTER — Other Ambulatory Visit: Payer: Self-pay | Admitting: Hematology and Oncology

## 2020-06-20 ENCOUNTER — Inpatient Hospital Stay (INDEPENDENT_AMBULATORY_CARE_PROVIDER_SITE_OTHER): Payer: Medicare Other | Admitting: Oncology

## 2020-06-20 ENCOUNTER — Ambulatory Visit: Payer: Medicare Other

## 2020-06-20 ENCOUNTER — Other Ambulatory Visit: Payer: Self-pay

## 2020-06-20 ENCOUNTER — Telehealth: Payer: Self-pay

## 2020-06-20 ENCOUNTER — Other Ambulatory Visit: Payer: Self-pay | Admitting: Oncology

## 2020-06-20 DIAGNOSIS — C090 Malignant neoplasm of tonsillar fossa: Secondary | ICD-10-CM | POA: Diagnosis not present

## 2020-06-20 DIAGNOSIS — Z5112 Encounter for antineoplastic immunotherapy: Secondary | ICD-10-CM | POA: Diagnosis not present

## 2020-06-20 DIAGNOSIS — D649 Anemia, unspecified: Secondary | ICD-10-CM | POA: Diagnosis not present

## 2020-06-20 DIAGNOSIS — D693 Immune thrombocytopenic purpura: Secondary | ICD-10-CM

## 2020-06-20 LAB — ABO/RH
ABO/RH(D): A POS
ABO/RH(D): A POS

## 2020-06-20 LAB — CBC AND DIFFERENTIAL
HCT: 26 — AB (ref 41–53)
Hemoglobin: 8.8 — AB (ref 13.5–17.5)
Neutrophils Absolute: 13.11
Platelets: 6 — AB (ref 150–399)
WBC: 13.8

## 2020-06-20 LAB — CBC
Absolute Lymphocytes: 0.14 — AB (ref 0.65–4.75)
MCV: 97 — AB (ref 80–94)
RBC: 2.67 — AB (ref 3.87–5.11)

## 2020-06-20 MED ORDER — HEPARIN SOD (PORK) LOCK FLUSH 100 UNIT/ML IV SOLN
250.0000 [IU] | INTRAVENOUS | Status: DC | PRN
  Filled 2020-06-20: qty 5

## 2020-06-20 MED ORDER — ALPRAZOLAM 0.5 MG PO TABS: 0.5000 mg | ORAL_TABLET | Freq: Three times a day (TID) | ORAL | 0 refills | Status: DC | PRN

## 2020-06-20 MED ORDER — ROMIPLOSTIM 125 MCG ~~LOC~~ SOLR
1.0000 ug/kg | Freq: Once | SUBCUTANEOUS | Status: AC
  Administered 2020-06-20: 85 ug via SUBCUTANEOUS
  Filled 2020-06-20: qty 0.17

## 2020-06-20 MED ORDER — SODIUM CHLORIDE 0.9% FLUSH
10.0000 mL | INTRAVENOUS | Status: DC | PRN
  Filled 2020-06-20: qty 10

## 2020-06-20 MED ORDER — SODIUM CHLORIDE 0.9% FLUSH
3.0000 mL | INTRAVENOUS | Status: DC | PRN
  Filled 2020-06-20: qty 10

## 2020-06-20 MED ORDER — SODIUM CHLORIDE 0.9% IV SOLUTION
250.0000 mL | Freq: Once | INTRAVENOUS | Status: DC
  Filled 2020-06-20: qty 250

## 2020-06-20 MED ORDER — ACETAMINOPHEN 325 MG PO TABS: 650.0000 mg | ORAL_TABLET | Freq: Once | ORAL | Status: AC

## 2020-06-20 MED ORDER — HEPARIN SOD (PORK) LOCK FLUSH 100 UNIT/ML IV SOLN
500.0000 [IU] | Freq: Every day | INTRAVENOUS | Status: DC | PRN
  Filled 2020-06-20: qty 5

## 2020-06-20 MED ORDER — SODIUM CHLORIDE 0.9% IV SOLUTION
250.0000 mL | Freq: Once | INTRAVENOUS | Status: AC
  Filled 2020-06-20: qty 250

## 2020-06-20 MED ORDER — SODIUM CHLORIDE 0.9% FLUSH
3.0000 mL | INTRAVENOUS | Status: AC | PRN
  Filled 2020-06-20: qty 10

## 2020-06-20 MED ORDER — DIPHENHYDRAMINE HCL 25 MG PO CAPS: 25.0000 mg | ORAL_CAPSULE | Freq: Once | ORAL | Status: DC

## 2020-06-20 MED ORDER — SODIUM CHLORIDE 0.9 % IV SOLN
40.0000 mg | Freq: Once | INTRAVENOUS | Status: AC
  Administered 2020-06-20: 40 mg via INTRAVENOUS
  Filled 2020-06-20: qty 4

## 2020-06-20 NOTE — Progress Notes (Unsigned)
1643: PT STABLE AT TIME OF DISCHARGE

## 2020-06-20 NOTE — Telephone Encounter (Signed)
Per 2/3 los next appt sched and given to patient 

## 2020-06-20 NOTE — Patient Instructions (Signed)
Idiopathic Thrombocytopenic Purpura Idiopathic thrombocytopenic purpura (ITP) is a disease in which the body's disease-fighting system (immune system) attacks platelets in the body. Platelets are blood cells that clump together to form clots. Blood clots help stop bleeding in the body. A person with ITP has too few platelets. As a result, it is harder for the blood to clot. A person may bruise and bleed easily, such as bleeding a lot from minor cuts and scrapes. ITP can affect both children and adults. It is usually a short-term (acute) condition in children and a long-term (chronic) condition in adults. What are the causes? The cause of ITP is not known. In some cases, this condition may develop:  After a viral infection.  During pregnancy.  After developing an immune system disorder. What increases the risk? You may be more likely to develop this condition if you:  Are male.  Are 62-78 years old. What are the signs or symptoms? If you have a mild case, you may not have any symptoms. In more serious cases, symptoms may include:  Bruising easily.  Minor injuries, like cuts and scrapes, that bleed for a long time.  Small red or purple dots under your skin (petechiae), especially on your shins.  Blood in the urine or stool (feces).  Nosebleeds.  Bleeding gums.  Heavy menstrual periods in women. How is this diagnosed? This condition may be diagnosed based on:  Your symptoms and medical history.  A physical exam.  Blood tests.  Tests of the spongy tissue inside your bones (bone marrow).   How is this treated? Treatment depends on how severe your condition is. Treatment may include:  Monitoring your symptoms and your platelet count over time. You may need to see your health care provider for blood tests on a regular basis.  Receiving donated blood products (transfusions), such as platelets.  Medicines to: ? Reduce inflammation (steroids). ? Increase how many platelets  your body makes. ? Reduce the activity of your immune system.  Surgery to remove your spleen, if other treatments are not effective. The spleen is an organ in your upper left abdomen. It stores blood cells and is involved in some immune system functions. In ITP, the spleen releases proteins (antibodies) that mistakenly attack platelets. Follow these instructions at home: Medicines  Take over-the-counter and prescription medicines only as told by your health care provider. Do not take the following unless your health care provider approves: ? Over-the-counter medicines that contain aspirin. ? NSAIDs such as ibuprofen and naproxen.  Talk with your health care provider before you take any new medicines. Certain medicines may increase your risk for dangerous bleeding. Preventing falls  Follow instructions from your health care provider about ways that you can help prevent falls and injuries at home. These may include: ? Removing loose rugs, cords, and other tripping hazards from walkways. ? Installing grab bars in bathrooms. ? Using night-lights.   General instructions  Tell all your health care providers, including your dentist, that you have a bleeding disorder. Make sure to tell providers before you have any procedure done, including dental cleanings.  Do not play contact sports or do activities that have a high risk for injury or bruising. Ask your health care provider what activities are safe for you.  Brush your teeth using a soft toothbrush.  When shaving, use an electric razor instead of a blade.  Wear a medical alert bracelet that says that you have a bleeding disorder. This can help you get the treatment  you need in case of emergency.  Keep all follow-up visits as told by your health care provider. This is important. You may need regular blood tests. Contact a health care provider if you have:  New symptoms.  Symptoms that get worse.  A fever. Get help right away if you  have:  A sudden, severe headache.  Sudden, severe nausea.  Severe bleeding.  Vomiting. Summary  Idiopathic thrombocytopenic purpura (ITP) is a disease in which the body's disease-fighting system (immune system) attacks blood cells that form clots to help stop bleeding in the body (platelets).  ITP can lead to bruising and bleeding easily, including frequent nosebleeds, blood in the urine or stool, and bleeding gums. In women, ITP can lead to heavy menstrual periods.  Treatment depends on how severe your condition is. It may include steroid therapy and medicines that reduce the activity of the immune system. In some cases, surgery is needed to remove the spleen.  Follow your health care provider's instructions about taking medicines, preventing falls, restricting some activities, and when to get help. This information is not intended to replace advice given to you by your health care provider. Make sure you discuss any questions you have with your health care provider. Document Revised: 10/13/2019 Document Reviewed: 10/13/2019 Elsevier Patient Education  2021 [employer].

## 2020-06-20 NOTE — Telephone Encounter (Signed)
William Bell in Hematology called with critical platelet value. Platelet count 6. Dr. Bobby Rumpf made aware.

## 2020-06-21 ENCOUNTER — Inpatient Hospital Stay: Payer: Medicare Other

## 2020-06-21 ENCOUNTER — Other Ambulatory Visit: Payer: Self-pay | Admitting: *Deleted

## 2020-06-21 ENCOUNTER — Telehealth: Payer: Self-pay | Admitting: *Deleted

## 2020-06-21 ENCOUNTER — Other Ambulatory Visit: Payer: Self-pay | Admitting: Hematology and Oncology

## 2020-06-21 ENCOUNTER — Other Ambulatory Visit: Payer: Self-pay | Admitting: Oncology

## 2020-06-21 DIAGNOSIS — D693 Immune thrombocytopenic purpura: Secondary | ICD-10-CM

## 2020-06-21 DIAGNOSIS — Z5112 Encounter for antineoplastic immunotherapy: Secondary | ICD-10-CM | POA: Diagnosis not present

## 2020-06-21 LAB — BPAM PLATELET PHERESIS
Blood Product Expiration Date: 202202052359
Blood Product Expiration Date: 202202062359
ISSUE DATE / TIME: 202202031201
ISSUE DATE / TIME: 202202031201
Unit Type and Rh: 6200
Unit Type and Rh: 6200

## 2020-06-21 LAB — CBC AND DIFFERENTIAL
HCT: 26 — AB (ref 41–53)
Hemoglobin: 8.7 — AB (ref 13.5–17.5)
Neutrophils Absolute: 19.93
Platelets: 6 — AB (ref 150–399)
WBC: 21.9

## 2020-06-21 LAB — PREPARE PLATELET PHERESIS
Unit division: 0
Unit division: 0

## 2020-06-21 LAB — TYPE AND SCREEN
ABO/RH(D): A POS
Antibody Screen: NEGATIVE

## 2020-06-21 LAB — CBC: RBC: 2.68 — AB (ref 3.87–5.11)

## 2020-06-21 NOTE — Telephone Encounter (Signed)
Pt had pfizer vaccines 1st dose 05/10/2019, 2nd dose 06/08/2019 and pfizer booster on 02/09/2020

## 2020-06-21 NOTE — Telephone Encounter (Signed)
RN with Dr Bobby Rumpf at [facility] called to set up appt for 3 units plts tomorrow.  Per Dr. Bobby Rumpf, ok to run plts as fast as pt can tolerate.  Per RN, type and screen already drawn.  Phone number given to that RN to contact our blood bank to make sure orders have crossed over and can be seen for preparation. RN will contact pt to advise of appt time.

## 2020-06-22 ENCOUNTER — Other Ambulatory Visit: Payer: Self-pay

## 2020-06-22 ENCOUNTER — Emergency Department (HOSPITAL_COMMUNITY): Payer: Medicare Other

## 2020-06-22 ENCOUNTER — Emergency Department (HOSPITAL_COMMUNITY)
Admission: EM | Admit: 2020-06-22 | Discharge: 2020-06-22 | Disposition: A | Discharge status: Home / Self Care | Payer: Medicare Other | Attending: Emergency Medicine | Admitting: Emergency Medicine

## 2020-06-22 ENCOUNTER — Inpatient Hospital Stay: Payer: Medicare Other

## 2020-06-22 DIAGNOSIS — R55 Syncope and collapse: Secondary | ICD-10-CM | POA: Diagnosis not present

## 2020-06-22 DIAGNOSIS — T7840XA Allergy, unspecified, initial encounter: Secondary | ICD-10-CM

## 2020-06-22 DIAGNOSIS — R42 Dizziness and giddiness: Secondary | ICD-10-CM | POA: Diagnosis not present

## 2020-06-22 DIAGNOSIS — Z85818 Personal history of malignant neoplasm of other sites of lip, oral cavity, and pharynx: Secondary | ICD-10-CM | POA: Diagnosis not present

## 2020-06-22 DIAGNOSIS — Z043 Encounter for examination and observation following other accident: Secondary | ICD-10-CM | POA: Diagnosis not present

## 2020-06-22 DIAGNOSIS — D693 Immune thrombocytopenic purpura: Secondary | ICD-10-CM | POA: Diagnosis not present

## 2020-06-22 DIAGNOSIS — Z5112 Encounter for antineoplastic immunotherapy: Secondary | ICD-10-CM | POA: Diagnosis not present

## 2020-06-22 DIAGNOSIS — R233 Spontaneous ecchymoses: Secondary | ICD-10-CM | POA: Diagnosis not present

## 2020-06-22 LAB — HEMOGLOBIN AND HEMATOCRIT, BLOOD
HCT: 29.8 % — ABNORMAL LOW (ref 39.0–52.0)
Hemoglobin: 10.2 g/dL — ABNORMAL LOW (ref 13.0–17.0)

## 2020-06-22 MED ORDER — EPINEPHRINE 0.3 MG/0.3ML IJ SOAJ: 0.3000 mg | INTRAMUSCULAR | 0 refills | Status: DC | PRN

## 2020-06-22 MED ORDER — SODIUM CHLORIDE 0.9% IV SOLUTION
250.0000 mL | Freq: Once | INTRAVENOUS | Status: AC
  Administered 2020-06-22: 250 mL via INTRAVENOUS
  Filled 2020-06-22: qty 250

## 2020-06-22 NOTE — ED Provider Notes (Signed)
[facility] DEPT Provider Note   CSN: [id number] Arrival date & time: 06/22/20  1157     History CC:  Near syncope  William Bell is a 70 y.o. male w/ hx of tonsillar cancer s/p trach, ITP, A Fib (not on A/C), presenting to ED with near syncope.  The patient reports that he was at the cancer center today getting a platelet transfusion.  He received 2 units of platelets per medical records.  He reports that during the transfusion, began noticing some itching of his arm.  He thought he may have developed hives.  He took 25 mg of Benadryl and Tylenol.  He was leaving the center and was walking in the hall began to feel lightheaded and then had brief loss of consciousness.  He does not think he struck his head on the floor.  He denies headache at this time.  He was brought to the emergency department for further evaluation.  Of note, the patient ports that he is typically on metoprolol and losartan for hypertension but has not been taking either for the past several days because blood pressure has been "a little on the low side".  He denies chest pain or shortness of breath.  He does not report any prior history of known allergies to platelets.  He says this is the first time he ever had a reaction like this.  He currently is asymptomatic, denies rash, throat tightness, or lightheadedness.  HPI     No past medical history on file.  Patient Active Problem List   Diagnosis Date Noted  . Malignant neoplasm of tonsillar fossa ([facility]) 06/19/2020  . Idiopathic thrombocytopenic purpura (ITP) ([facility]) 06/14/2020    No family history on file.     Home Medications Prior to Admission medications   Medication Sig Start Date End Date Taking? Authorizing Provider  acetaminophen (TYLENOL) 500 MG tablet Take 1,000 mg by mouth every 6 (six) hours as needed for mild pain or headache.   Yes [provider]  ALPRAZolam (XANAX) 0.5 MG tablet Take 1 tablet (0.5 mg  total) by mouth 3 (three) times daily as needed for anxiety. 06/20/20  Yes Dayton Scrape A, NP  atorvastatin (LIPITOR) 80 MG tablet Take 80 mg by mouth daily.   Yes [provider]  citalopram (CELEXA) 20 MG tablet Take 20 mg by mouth daily.   Yes [provider]  co-enzyme Q-10 30 MG capsule Take 30 mg by mouth daily.   Yes [provider]  EPINEPHrine 0.3 mg/0.3 mL IJ SOAJ injection Inject 0.3 mg into the muscle as needed for anaphylaxis. 06/22/20  Yes Wyvonnia Dusky, MD  levothyroxine (SYNTHROID) 88 MCG tablet Take 88 mcg by mouth daily before breakfast.   Yes [provider]  losartan (COZAAR) 25 MG tablet Take 12.5 mg by mouth daily. 1/2 tablet   Yes [provider]  metoprolol tartrate (LOPRESSOR) 25 MG tablet Take 12.5 mg by mouth 2 (two) times daily.   Yes [provider]    Allergies    Patient has no known allergies.  Review of Systems   Review of Systems  Constitutional: Negative for chills and fever.  HENT: Negative for ear pain and sore throat.   Eyes: Negative for pain and visual disturbance.  Respiratory: Negative for cough and shortness of breath.   Cardiovascular: Negative for chest pain and palpitations.  Gastrointestinal: Negative for abdominal pain and vomiting.  Genitourinary: Negative for dysuria and hematuria.  Musculoskeletal: Negative  for arthralgias and back pain.  Skin: Negative for color change and rash.  Neurological: Negative for syncope, light-headedness and headaches.  All other systems reviewed and are negative.   Physical Exam Updated Vital Signs BP (!) 152/101   Pulse (!) 47   Temp 97.6 F (36.4 C) (Oral)   Resp (!) 21   SpO2 99%   Physical Exam Constitutional:      General: He is not in acute distress. HENT:     Head: Normocephalic and atraumatic.  Eyes:     Conjunctiva/sclera: Conjunctivae normal.     Pupils: Pupils are equal, round, and reactive to light.  Neck:     Comments:  Trach in place Cardiovascular:     Rate and Rhythm: Normal rate. Rhythm irregular.     Comments: HR 40-90 bpm, irregular, A Fib on telemetry Pulmonary:     Effort: Pulmonary effort is normal. No respiratory distress.  Abdominal:     General: There is no distension.     Tenderness: There is no abdominal tenderness.  Skin:    General: Skin is warm and dry.     Comments: Diffuse ecchymosis of arms and legs (chronic)  Neurological:     General: No focal deficit present.     Mental Status: He is alert. Mental status is at baseline.  Psychiatric:        Mood and Affect: Mood normal.        Behavior: Behavior normal.     ED Results / Procedures / Treatments   Labs (all labs ordered are listed, but only abnormal results are displayed) Labs Reviewed  HEMOGLOBIN AND HEMATOCRIT, BLOOD - Abnormal; Notable for the following components:      Result Value   Hemoglobin 10.2 (*)    HCT 29.8 (*)    All other components within normal limits    EKG EKG Interpretation  Date/Time:  Saturday June 22 2020 12:34:20 EST Ventricular Rate:  96 PR Interval:    QRS Duration: 101 QT Interval:  400 QTC Calculation: 506 R Axis:   -39 Text Interpretation: A Fib Borderline short PR interval Abnormal R-wave progression, late transition Left ventricular hypertrophy Nonspecific T abnormalities, lateral leads Prolonged QT interval NO STEMI Confirmed by Octaviano Glow [phone]) on 06/22/2020 12:39:29 PM   Radiology CT Head Wo Contrast  Result Date: 06/22/2020 CLINICAL DATA:  Fall EXAM: CT HEAD WITHOUT CONTRAST TECHNIQUE: Contiguous axial images were obtained from the base of the skull through the vertex without intravenous contrast. COMPARISON:  June 12, 2020. FINDINGS: Brain: No evidence of acute infarction, hemorrhage, hydrocephalus, extra-axial collection or mass lesion/mass effect. Periventricular white matter hypodensities consistent with sequela of chronic microvascular ischemic disease. Partially  empty sella. Vascular: No hyperdense vessel or unexpected calcification. Skull: Normal. Negative for fracture or focal lesion. Sinuses/Orbits: No acute finding. Other: None IMPRESSION: 1. No acute intracranial abnormality. Electronically Signed   By: Valentino Saxon MD   On: 06/22/2020 13:07    Procedures Procedures   Medications Ordered in ED Medications - No data to display  ED Course  I have reviewed the triage vital signs and the nursing notes.  Pertinent labs & imaging results that were available during my care of the patient were reviewed by me and considered in my medical decision making (see chart for details).  70 yo male presenting to ED with near syncopal episode after platelet transfusion today  DDx includes orthostatic hypotension vs allergic reaction vs A Fib with RVR vs anemia vs other  ECG on presentation shows A Fib without RVR.  Telemetry shows rate controlled A Fib.  He is not on A/C with his high bleeding risk.    CT Head obtained without obvious injury or bleed noted.  He denies headache.  No signs or symptoms of stroke.  Hgb checked - although trending downwards for past few days, today appears to be 10.2, which is close to his baseline 6 months ago.  Doubt significant blood loss anemia.  Discussed HR with him.  He states it is always labile, "Sometimes low, sometimes high."  He is not taking rate control or BP meds due to BP being labile and recently low at home.  His wife at bedside states he sometimes gets dizzy when standing.  This may need outpatient attention, but I don't think is due to an emergent cause.  I offered some IV fluids but he does not want them - advised hydrating at home.  Discussed possible allergic reaction - itching and hives seems to suggest a reaction.  I prescribed epinephrine.  He is a former Software engineer.  If he ever has that constellation of symptoms itching and lightheadedness he should use the epi pen.  He also needs to discuss his  reaction with his oncologist and infusion clinic.  He is quite adamant about wanting a limited workup in the ED.  He feels at baseline and stable to go home.  I think this is reasonable, with outpatient follow up.    Final Clinical Impression(s) / ED Diagnoses Final diagnoses:  Allergic reaction, initial encounter  Near syncope    Rx / DC Orders ED Discharge Orders         Ordered    EPINEPHrine 0.3 mg/0.3 mL IJ SOAJ injection  As needed        06/22/20 1357           Wyvonnia Dusky, MD 06/22/20 1801

## 2020-06-22 NOTE — Progress Notes (Signed)
At approx. 1120, a patient presented into the infusion suite stating, "the CNA needs help, someone is laying on the ground". I immediately presented to the hallway where the incident occurred. I found Aamera, CNA by patient's side sitting on the floor. Patient A & O x 4 at this time. Per Gearldine Shown, patient was holding onto wall, staggering with walking, she immediately presented to his side and was able to ease him to the floor before falling. Patient stated "I felt dizzy and I think it is because I was moving too fast". Aamera and I attempted to stand patient up to place in wheelchair. Patient stood for a few seconds and then proceeded to go limp and passed out. We were able to ease him to the floor again with out a fall. I instructed CNA to get more help from RNs in infusion suite. Patient was able to respond after a few seconds. Amy, RN then presented to patient side. It was then noted that patient had hives all over chest, abdomen, arms, and legs. When mentioned to patient, he stated "I felt a little itchy but I'm okay, I just want to go home and take some Benadryl". AC was then contacted and advised of situation. Wife presented to the scene at this time. Attempted to gain IV access x 2, Patient then jerked it out as it was being secured. Security, [facility], and another RN presented to the area with a stretcher. Patient was then moved from floor to stretcher. Skin tear was noted to LLE, prior to moving patient to bed. This was thought to be caused by the patient's shoe when he had a syncopal episode and was lowered to the floor. Patient was then transferred by [facility], security, and the second RN to the ER.

## 2020-06-22 NOTE — Patient Instructions (Signed)
Thrombocytopenia Thrombocytopenia means that you have a low number of platelets in your blood. Platelets are tiny cells in the blood. When you bleed, they clump together at the cut or injury to stop the bleeding. This is called blood clotting. If you do not have enough platelets, it can cause bleeding problems. Some cases of this condition are mild while others are more severe. What are the causes? This condition may be caused by:  Your body not making enough platelets. This may be caused by: ? Your bone marrow not making blood cells (aplastic anemia). ? Cancer in the bone marrow. ? Certain medicines. ? Infection in the bone marrow. ? Drinking a lot of alcohol.  Your body destroying platelets too quickly. This may be caused by: ? Certain immune diseases. ? Certain medicines. ? Certain blood clotting disorders. ? Certain disorders that are passed from parent to child (inherited). ? Certain bleeding disorders. ? Pregnancy. ? Having a spleen that is larger than normal. What are the signs or symptoms?  Bleeding that is not normal.  Nosebleeds.  Heavy menstrual periods.  Blood in the pee (urine) or poop (stool).  A purple-like color to the skin (purpura).  Bruising.  A rash that looks like pinpoint, purple-red spots (petechiae). How is this treated?  Treatment of another condition that is causing the low platelet count.  Medicines to help protect your platelets from being destroyed.  A replacement (transfusion) of platelets to stop or prevent bleeding.  Surgery to remove the spleen. Follow these instructions at home: Activity  Avoid activities that could cause you to get hurt or bruised. Follow instructions about how to prevent falls.  Take care not to cut yourself: ? When you shave. ? When you use scissors, needles, knives, or other tools.  Take care not to burn yourself: ? When you use an iron. ? When you cook. General instructions  Check your skin and the  inside of your mouth for bruises or blood as told by your doctor.  Check to see if there is blood in your spit (sputum), pee, and poop. Do this as told by your doctor.  Do not drink alcohol.  Take over-the-counter and prescription medicines only as told by your doctor.  Do not take any medicines that have aspirin or NSAIDs in them. These medicines can thin your blood and cause you to bleed.  Tell all of your doctors that you have this condition. Be sure to tell your dentist and eye doctor too.   Contact a doctor if:  You have bruises and you do not know why. Get help right away if:  You are bleeding anywhere on your body.  You have blood in your spit, pee, or poop. Summary  Thrombocytopenia means that you have a low number of platelets in your blood.  Platelets are needed for blood clotting.  Symptoms of this condition include bleeding that is not normal, and bruising.  Take care not to cut or burn yourself. This information is not intended to replace advice given to you by your health care provider. Make sure you discuss any questions you have with your health care provider. Document Revised: 02/03/2018 Document Reviewed: 02/03/2018 Elsevier Patient Education  2021 Elsevier Inc.  

## 2020-06-22 NOTE — Progress Notes (Signed)
Pt has order for 3 units of platelets.   Per blood bank / Dr Saralyn Pilar, only two units authorized for this transfusion appt.

## 2020-06-22 NOTE — ED Triage Notes (Signed)
Patient coming from cancer center with c/o fall while leaving the cancer center. Patient was given 3 units of platelets at the cancer center this morning. Patient report having hive during the transfusion and never said anything to the nurse. Patient state he pre-medicated him self with benadryl 25 and tylenol 650 mg  before coming to cancer center. Patient denies hitting his head on the floor.

## 2020-06-23 LAB — BPAM PLATELET PHERESIS
Blood Product Expiration Date: 202202052359
Blood Product Expiration Date: 202202052359
ISSUE DATE / TIME: 202202050902
ISSUE DATE / TIME: 202202050956
Unit Type and Rh: 7300
Unit Type and Rh: 7300

## 2020-06-23 LAB — PREPARE PLATELET PHERESIS
Unit division: 0
Unit division: 0

## 2020-06-24 ENCOUNTER — Other Ambulatory Visit: Payer: Self-pay | Admitting: Pharmacist

## 2020-06-24 ENCOUNTER — Telehealth: Payer: Self-pay | Admitting: Oncology

## 2020-06-24 ENCOUNTER — Other Ambulatory Visit: Payer: Self-pay | Admitting: Hematology and Oncology

## 2020-06-24 ENCOUNTER — Other Ambulatory Visit: Payer: Self-pay

## 2020-06-24 ENCOUNTER — Encounter: Payer: Self-pay | Admitting: Oncology

## 2020-06-24 ENCOUNTER — Inpatient Hospital Stay: Payer: Medicare Other

## 2020-06-24 VITALS — BP 130/85 | HR 69 | Temp 98.1°F | Resp 18 | Wt 189.0 lb

## 2020-06-24 DIAGNOSIS — D693 Immune thrombocytopenic purpura: Secondary | ICD-10-CM

## 2020-06-24 DIAGNOSIS — Z5112 Encounter for antineoplastic immunotherapy: Secondary | ICD-10-CM | POA: Diagnosis not present

## 2020-06-24 MED ORDER — ACETAMINOPHEN 325 MG PO TABS
650.0000 mg | ORAL_TABLET | Freq: Once | ORAL | Status: AC
  Filled 2020-06-24: qty 2

## 2020-06-24 MED ORDER — SODIUM CHLORIDE 0.9 % IV SOLN
Freq: Once | INTRAVENOUS | Status: AC
  Filled 2020-06-24: qty 250

## 2020-06-24 MED ORDER — DIPHENHYDRAMINE HCL 50 MG/ML IJ SOLN
INTRAMUSCULAR | Status: AC
  Filled 2020-06-24: qty 1

## 2020-06-24 MED ORDER — ACETAMINOPHEN 325 MG PO TABS: 650.0000 mg | ORAL_TABLET | Freq: Once | ORAL | Status: DC

## 2020-06-24 MED ORDER — ACETAMINOPHEN 325 MG PO TABS
ORAL_TABLET | ORAL | Status: AC
  Filled 2020-06-24: qty 2

## 2020-06-24 MED ORDER — DIPHENHYDRAMINE HCL 50 MG/ML IJ SOLN
25.0000 mg | Freq: Once | INTRAMUSCULAR | Status: DC
  Filled 2020-06-24: qty 0.5

## 2020-06-24 MED ORDER — DIPHENHYDRAMINE HCL 50 MG/ML IJ SOLN
25.0000 mg | Freq: Once | INTRAMUSCULAR | Status: AC
  Administered 2020-06-24: 25 mg via INTRAVENOUS

## 2020-06-24 MED ORDER — SODIUM CHLORIDE 0.9 % IV SOLN
375.0000 mg/m2 | Freq: Once | INTRAVENOUS | Status: AC
  Administered 2020-06-24: 700 mg via INTRAVENOUS
  Filled 2020-06-24: qty 50

## 2020-06-24 MED ORDER — ACETAMINOPHEN 325 MG PO TABS
650.0000 mg | ORAL_TABLET | Freq: Once | ORAL | Status: AC
  Administered 2020-06-24: 650 mg via ORAL

## 2020-06-24 NOTE — Patient Instructions (Addendum)
[facility] Discharge Instructions for Patients Receiving Chemotherapy  Today you received the following chemotherapy agents Rituxan  To help prevent nausea and vomiting after your treatment, we encourage you to take your nausea medication as directed.   If you develop nausea and vomiting that is not controlled by your nausea medication, call the clinic.   BELOW ARE SYMPTOMS THAT SHOULD BE REPORTED IMMEDIATELY:  *FEVER GREATER THAN 100.5 F  *CHILLS WITH OR WITHOUT FEVER  NAUSEA AND VOMITING THAT IS NOT CONTROLLED WITH YOUR NAUSEA MEDICATION  *UNUSUAL SHORTNESS OF BREATH  *UNUSUAL BRUISING OR BLEEDING  TENDERNESS IN MOUTH AND THROAT WITH OR WITHOUT PRESENCE OF ULCERS  *URINARY PROBLEMS  *BOWEL PROBLEMS  UNUSUAL RASH Items with * indicate a potential emergency and should be followed up as soon as possible.  Feel free to call the clinic should you have any questions or concerns at The clinic phone number is [phone].  Please show the [facility] at check-in to the Emergency Department and triage nurse.   Platelet Transfusion A platelet transfusion is a procedure in which you receive donated platelets through an IV. Platelets are tiny pieces of blood cells. When you get an injury, platelets clump together in the area to form a blood clot. This helps stop bleeding and is the beginning of the healing process. If you have too few platelets, your blood may have trouble clotting. This may cause you to bleed and bruise very easily. You may need a platelet transfusion if you have a condition that causes a low number of platelets (thrombocytopenia). A platelet transfusion may be used to stop or prevent excessive bleeding. Tell a health care provider about:  Any reactions you have had during previous transfusions.  Any allergies you have.  All medicines you are taking, including vitamins, herbs, eye drops, creams, and over-the-counter  medicines.  Any blood disorders you have.  Any surgeries you have had.  Any medical conditions you have.  Whether you are pregnant or may be pregnant. What are the risks? Generally, this is a safe procedure. However, problems may occur, including:  Fever.  Infection.  Allergic reaction to the donor platelets.  Your body's disease-fighting system (immune system) attacking the donor platelets (hemolytic reaction). This is rare.  A rare reaction that causes lung damage (transfusion-related acute lung injury). What happens before the procedure? Medicines  Ask your health care provider about: ? Changing or stopping your regular medicines. This is especially important if you are taking diabetes medicines or blood thinners. ? Taking medicines such as aspirin and ibuprofen. These medicines can thin your blood. Do not take these medicines unless your health care provider tells you to take them. ? Taking over-the-counter medicines, vitamins, herbs, and supplements. General instructions  You will have a blood test to determine your blood type. Your blood type determines what kind of platelets you will be given.  Follow instructions from your health care provider about eating or drinking restrictions.  If you have had an allergic reaction to a transfusion in the past, you may be given medicine to help prevent a reaction.  Your temperature, blood pressure, pulse, and breathing will be monitored. What happens during the procedure?  An IV will be inserted into one of your veins.  For your safety, two health care providers will verify your identity along with the donor platelets about to be infused.  A bag of donor platelets will be connected to your IV. The platelets will flow  into your bloodstream. This usually takes 30-60 minutes.  Your temperature, blood pressure, pulse, and breathing will be monitored during the transfusion. This helps detect early signs of any reaction.  You will  also be monitored for other symptoms that may indicate a reaction, including chills, hives, or itching.  If you have signs of a reaction at any time, your transfusion will be stopped, and you may be given medicine to help manage the reaction.  When your transfusion is complete, your IV will be removed.  Pressure may be applied to the IV site for a few minutes to stop any bleeding.  The IV site will be covered with a bandage (dressing). The procedure may vary among health care providers and hospitals.   What happens after the procedure?  Your blood pressure, temperature, pulse, and breathing will be monitored until you leave the hospital or clinic.  You may have some bruising and soreness at your IV site. Follow these instructions at home: Medicines  Take over-the-counter and prescription medicines only as told by your health care provider.  Talk with your health care provider before you take any medicines that contain aspirin or NSAIDs. These medicines increase your risk for dangerous bleeding. General instructions  Change or remove your dressing as told by your health care provider.  Return to your normal activities as told by your health care provider. Ask your health care provider what activities are safe for you.  Do not take baths, swim, or use a hot tub until your health care provider approves. Ask your health care provider if you may take showers.  Check your IV site every day for signs of infection. Check for: ? Redness, swelling, or pain. ? Fluid or blood. If fluid or blood drains from your IV site, use your hands to press down firmly on a bandage covering the area for a minute or two. Doing this should stop the bleeding. ? Warmth. ? Pus or a bad smell.  Keep all follow-up visits as told by your health care provider. This is important. Contact a health care provider if you have:  A headache that does not go away with medicine.  Hives, rash, or itchy skin.  Nausea or  vomiting.  Unusual tiredness or weakness.  Signs of infection at your IV site. Get help right away if:  You have a fever or chills.  You urinate less often than usual.  Your urine is darker colored than normal.  You have any of the following: ? Trouble breathing. ? Pain in your back, abdomen, or chest. ? Cool, clammy skin. ? A fast heartbeat. Summary  Platelets are tiny pieces of blood cells that clump together to form a blood clot when you have an injury. If you have too few platelets, your blood may have trouble clotting.  A platelet transfusion is a procedure in which you receive donated platelets through an IV.  A platelet transfusion may be used to stop or prevent excessive bleeding.  After the procedure, check your IV site every day for signs of infection, including redness, swelling, pain, or warmth. This information is not intended to replace advice given to you by your health care provider. Make sure you discuss any questions you have with your health care provider. Document Revised: 06/09/2017 Document Reviewed: 06/09/2017 Elsevier Patient Education  2021 [employer].

## 2020-06-24 NOTE — Progress Notes (Signed)
1018: Platlet count today is 4.  Patient coughing up blood clots Dr. Bobby Rumpf made aware.Rituxan planned ,platlets 2 units on order TCM sent awaiting blood bank. 1632: Patient stable at discharge continues to have blood clots hemoptysis. DR Bobby Rumpf orders CBC in am and potential platlets again pending labs. Discussed with patient and wife plan for tomorrow. Patient and wife aware to call 911 for any bleeding concerns, or emergency medical treatments he might need.

## 2020-06-24 NOTE — Addendum Note (Signed)
Addended by: Juanetta Beets on: 06/24/2020 04:46 PM   Modules accepted: Orders

## 2020-06-24 NOTE — Telephone Encounter (Signed)
Per 2/7 Staff Msg, patient scheduled for 2/8 Labs at 8:30 am  Patient notified

## 2020-06-25 ENCOUNTER — Other Ambulatory Visit: Payer: Self-pay | Admitting: Hematology and Oncology

## 2020-06-25 ENCOUNTER — Inpatient Hospital Stay: Payer: Medicare Other

## 2020-06-25 ENCOUNTER — Other Ambulatory Visit: Payer: Self-pay | Admitting: Pharmacist

## 2020-06-25 ENCOUNTER — Encounter: Payer: Self-pay | Admitting: Oncology

## 2020-06-25 VITALS — BP 149/83 | HR 87 | Temp 98.4°F | Resp 18

## 2020-06-25 DIAGNOSIS — D693 Immune thrombocytopenic purpura: Secondary | ICD-10-CM

## 2020-06-25 DIAGNOSIS — D649 Anemia, unspecified: Secondary | ICD-10-CM | POA: Diagnosis not present

## 2020-06-25 DIAGNOSIS — Z5112 Encounter for antineoplastic immunotherapy: Secondary | ICD-10-CM | POA: Diagnosis not present

## 2020-06-25 LAB — BPAM PLATELET PHERESIS
Blood Product Expiration Date: 202202102359
Blood Product Expiration Date: 202202102359
ISSUE DATE / TIME: 202202071049
ISSUE DATE / TIME: 202202071049
Unit Type and Rh: 5100
Unit Type and Rh: 5100

## 2020-06-25 LAB — PREPARE PLATELET PHERESIS
Unit division: 0
Unit division: 0

## 2020-06-25 LAB — CBC AND DIFFERENTIAL
HCT: 24 — AB (ref 41–53)
Hemoglobin: 7.9 — AB (ref 13.5–17.5)
Neutrophils Absolute: 10.32
Platelets: 4 — AB (ref 150–399)
WBC: 11.6

## 2020-06-25 LAB — PREPARE RBC (CROSSMATCH)

## 2020-06-25 LAB — CBC
MCV: 99 — AB (ref 80–94)
RBC: 2.39 — AB (ref 3.87–5.11)

## 2020-06-25 MED ORDER — ROMIPLOSTIM INJECTION 500 MCG
5.0000 ug/kg | Freq: Once | SUBCUTANEOUS | Status: AC
  Administered 2020-06-25: 430 ug via SUBCUTANEOUS
  Filled 2020-06-25: qty 0.86

## 2020-06-25 MED ORDER — DIPHENHYDRAMINE HCL 50 MG/ML IJ SOLN
INTRAMUSCULAR | Status: AC
  Filled 2020-06-25: qty 1

## 2020-06-25 MED ORDER — SODIUM CHLORIDE 0.9% IV SOLUTION
250.0000 mL | Freq: Once | INTRAVENOUS | Status: AC
  Administered 2020-06-25: 250 mL via INTRAVENOUS
  Filled 2020-06-25: qty 250

## 2020-06-25 MED ORDER — DIPHENHYDRAMINE HCL 50 MG/ML IJ SOLN
25.0000 mg | Freq: Once | INTRAMUSCULAR | Status: AC
  Administered 2020-06-25: 25 mg via INTRAVENOUS

## 2020-06-25 NOTE — Progress Notes (Signed)
1423: second encounter for PRBC's.

## 2020-06-25 NOTE — Patient Instructions (Signed)
Romiplostim injection What is this medicine? ROMIPLOSTIM (roe mi PLOE stim) helps your body make more platelets. This medicine is used to treat low platelets caused by chronic idiopathic thrombocytopenic purpura (ITP) or a bone marrow syndrome caused by radiation sickness. This medicine may be used for other purposes; ask your health care provider or pharmacist if you have questions. COMMON BRAND NAME(S): Nplate What should I tell my health care provider before I take this medicine? They need to know if you have any of these conditions:  blood clots  myelodysplastic syndrome  an unusual or allergic reaction to romiplostim, mannitol, other medicines, foods, dyes, or preservatives  pregnant or trying to get pregnant  breast-feeding How should I use this medicine? This medicine is injected under the skin. It is given by a health care provider in a hospital or clinic setting. A special MedGuide will be given to you before each treatment. Be sure to read this information carefully each time. Talk to your health care provider about the use of this medicine in children. While it may be prescribed for children as young as newborns for selected conditions, precautions do apply. Overdosage: If you think you have taken too much of this medicine contact a poison control center or emergency room at once. NOTE: This medicine is only for you. Do not share this medicine with others. What if I miss a dose? Keep appointments for follow-up doses. It is important not to miss your dose. Call your health care provider if you are unable to keep an appointment. What may interact with this medicine? Interactions are not expected. This list may not describe all possible interactions. Give your health care provider a list of all the medicines, herbs, non-prescription drugs, or dietary supplements you use. Also tell them if you smoke, drink alcohol, or use illegal drugs. Some items may interact with your  medicine. What should I watch for while using this medicine? Visit your health care provider for regular checks on your progress. You may need blood work done while you are taking this medicine. Your condition will be monitored carefully while you are receiving this medicine. It is important not to miss any appointments. What side effects may I notice from receiving this medicine? Side effects that you should report to your doctor or health care professional as soon as possible:  allergic reactions (skin rash, itching or hives; swelling of the face, lips, or tongue)  bleeding (bloody or black, tarry stools; red or dark brown urine; spitting up blood or brown material that looks like coffee grounds; red spots on the skin; unusual bruising or bleeding from the eyes, gums, or nose)  blood clot (chest pain; shortness of breath; pain, swelling, or warmth in the leg)  stroke (changes in vision; confusion; trouble speaking or understanding; severe headaches; sudden numbness or weakness of the face, arm or leg; trouble walking; dizziness; loss of balance or coordination) Side effects that usually do not require medical attention (report to your doctor or health care professional if they continue or are bothersome):  diarrhea  dizziness  headache  joint pain  muscle pain  stomach pain  trouble sleeping This list may not describe all possible side effects. Call your doctor for medical advice about side effects. You may report side effects to FDA at 1-800-FDA-1088. Where should I keep my medicine? This medicine is given in a hospital or clinic. It will not be stored at home. NOTE: This sheet is a summary. It may not cover all possible   information. If you have questions about this medicine, talk to your doctor, pharmacist, or health care provider.  2021 Elsevier/Gold Standard (2019-06-19 10:28:13)  

## 2020-06-25 NOTE — Patient Instructions (Signed)

## 2020-06-25 NOTE — Progress Notes (Signed)
1300: Patient took tylenol 650 mg/.20.3 ml via peg tube prior to admission today to center. Patient b/p elevated due to no b/p meds taken today at home. Continues with large blood clots hemoptysis & VS reported to  Dr Bobby Rumpf.No Naithen Rivenburg bleeding noted or reported per family. Benadryl given here as charted. 1700: PT STABLE AT TIME OF DISCHARGE

## 2020-06-26 ENCOUNTER — Inpatient Hospital Stay: Payer: Medicare Other

## 2020-06-26 ENCOUNTER — Other Ambulatory Visit: Payer: Self-pay | Admitting: Hematology and Oncology

## 2020-06-26 ENCOUNTER — Telehealth: Payer: Self-pay

## 2020-06-26 ENCOUNTER — Other Ambulatory Visit: Payer: Self-pay | Admitting: Oncology

## 2020-06-26 DIAGNOSIS — D693 Immune thrombocytopenic purpura: Secondary | ICD-10-CM | POA: Diagnosis not present

## 2020-06-26 DIAGNOSIS — D649 Anemia, unspecified: Secondary | ICD-10-CM | POA: Diagnosis not present

## 2020-06-26 LAB — TYPE AND SCREEN
ABO/RH(D): A POS
Antibody Screen: NEGATIVE
Unit division: 0

## 2020-06-26 LAB — CBC AND DIFFERENTIAL
HCT: 27 — AB (ref 41–53)
Hemoglobin: 9.1 — AB (ref 13.5–17.5)
Neutrophils Absolute: 7.56
Platelets: 6 — AB (ref 150–399)
WBC: 9

## 2020-06-26 LAB — CBC
MCV: 93 (ref 76–111)
RBC: 2.87 — AB (ref 3.87–5.11)

## 2020-06-26 LAB — BPAM RBC
Blood Product Expiration Date: 202203032359
ISSUE DATE / TIME: 202202081204
Unit Type and Rh: 6200

## 2020-06-26 NOTE — Progress Notes (Signed)
[facility]  [address] [place],  [place]  [address] [phone]  Clinic Day:  06/27/2020  Referring physician: Mateo Flow, MD   HISTORY OF PRESENT ILLNESS:  The patient is a 70 y.o. male with splenectomy-refractory ITP.  Despite receiving multiple units of platelets, as well as IVIG and Decadron, his platelets have recently remained below 10.  He has already received 2 doses of weekly Rituxan.  He has also received 2 doses of Nplate.  The first dose was at 1 mcg/kg; the 2nd dose was 5 mcg/kg as his platelets remained <10.  He comes in today to reassess his severely refractory ITP.  The patient claims to have experienced mild epistaxis last night, which ultimately dissipated on its own.  He denies having other severe bleeding/bruising episodes over the past few days.  PHYSICAL EXAM:  Blood pressure 130/84, pulse 84, temperature 98.2 F (36.8 C), resp. rate 16, height 5\' 8"  (1.727 m), weight 184 lb 12.8 oz (83.8 kg), SpO2 94 %. Wt Readings from Last 3 Encounters:  06/27/20 184 lb 12.8 oz (83.8 kg)  06/24/20 189 lb (85.7 kg)  06/20/20 188 lb 0.8 oz (85.3 kg)   Body mass index is 28.1 kg/m. Performance status (ECOG): 1 - Symptomatic but completely ambulatory Physical Exam Constitutional:      Appearance: Normal appearance. He is not ill-appearing.  HENT:     Nose:     Right Nostril: No epistaxis.     Left Nostril: No epistaxis.     Mouth/Throat:     Mouth: Mucous membranes are moist.     Pharynx: Oropharynx is clear. No oropharyngeal exudate or posterior oropharyngeal erythema.  Neck:     Trachea: Tracheostomy (old blood is seen in the area) present.  Cardiovascular:     Rate and Rhythm: Normal rate and regular rhythm.     Heart sounds: No murmur heard. No friction rub. No gallop.   Pulmonary:     Effort: Pulmonary effort is normal. No respiratory distress.     Breath sounds: Normal breath sounds. No wheezing, rhonchi or rales.  Chest:   Breasts:     Right: No axillary adenopathy or supraclavicular adenopathy.     Left: No axillary adenopathy or supraclavicular adenopathy.    Abdominal:     General: Bowel sounds are normal. There is no distension.     Palpations: Abdomen is soft. There is no mass.     Tenderness: There is no abdominal tenderness.  Musculoskeletal:        General: No swelling.     Right lower leg: No edema.     Left lower leg: No edema.  Lymphadenopathy:     Cervical: No cervical adenopathy.     Upper Body:     Right upper body: No supraclavicular or axillary adenopathy.     Left upper body: No supraclavicular or axillary adenopathy.     Lower Body: No right inguinal adenopathy. No left inguinal adenopathy.  Skin:    General: Skin is warm.     Coloration: Skin is not jaundiced.     Findings: No lesion or rash.  Neurological:     General: No focal deficit present.     Mental Status: He is alert and oriented to person, place, and time. Mental status is at baseline.     Cranial Nerves: Cranial nerves are intact.  Psychiatric:        Mood and Affect: Mood normal.  Behavior: Behavior normal.        Thought Content: Thought content normal.     LABS:      CBC Latest Ref Rng & Units 06/26/2020 06/25/2020 06/22/2020  WBC - 9.0 11.6 -  Hemoglobin 13.5 - 17.5 9.1(A) 7.9(A) 10.2(L)  Hematocrit 41 - 53 27(A) 24(A) 29.8(L)  Platelets 150 - 399 6(A) 4(A) -   CMP Latest Ref Rng & Units 06/17/2020  BUN 4 - 21 29(A)  Creatinine 0.6 - 1.3 0.5(A)  Sodium 137 - 147 133(A)  Potassium 3.4 - 5.3 3.4  Chloride 99 - 108 96(A)  CO2 13 - 22 28(A)  Calcium 8.7 - 10.7 8.1(A)    ASSESSMENT & PLAN:  Assessment/Plan:  A 70 y.o. male with a severe form of splenectomy-refractory ITP that has been recalcitrant to multiple interventions thus far.  Although very modest, there has been a rise in his platelets.  However, they remain below 10.  Based upon his low platelets, he will receive another dose of Nplate 5  mcg/kg today.  I am also in the process of ordering fostamatinib pills, which he would take at 100 mg bid to further help his severely refractory ITP.   He will also proceed with his 3rd of 4 planned weekly Rituxan treatments early next week.  Otherwise, I will see him back in 1 week for repeat clinical assessment. The patient understands all the plans discussed today and is in agreement with them.     Vona Whiters Macarthur Critchley, MD

## 2020-06-26 NOTE — Addendum Note (Signed)
Addended by: Juanetta Beets on: 06/26/2020 09:42 AM   Modules accepted: Orders

## 2020-06-26 NOTE — Telephone Encounter (Signed)
Scotty from lab called and stated his platelets are 6. Notified Dr. Bobby Rumpf of results.

## 2020-06-27 ENCOUNTER — Other Ambulatory Visit: Payer: Self-pay | Admitting: Hematology and Oncology

## 2020-06-27 ENCOUNTER — Inpatient Hospital Stay: Payer: Medicare Other

## 2020-06-27 ENCOUNTER — Telehealth: Payer: Self-pay | Admitting: Oncology

## 2020-06-27 ENCOUNTER — Telehealth: Payer: Self-pay

## 2020-06-27 ENCOUNTER — Ambulatory Visit: Payer: Medicare Other

## 2020-06-27 ENCOUNTER — Inpatient Hospital Stay (INDEPENDENT_AMBULATORY_CARE_PROVIDER_SITE_OTHER): Payer: Medicare Other | Admitting: Oncology

## 2020-06-27 ENCOUNTER — Other Ambulatory Visit: Payer: Self-pay

## 2020-06-27 ENCOUNTER — Other Ambulatory Visit: Payer: Self-pay | Admitting: Oncology

## 2020-06-27 VITALS — BP 130/84 | HR 84 | Temp 98.2°F | Resp 16 | Ht 68.0 in | Wt 184.8 lb

## 2020-06-27 VITALS — BP 121/75 | HR 90 | Temp 98.1°F | Resp 18 | Ht 68.0 in | Wt 183.5 lb

## 2020-06-27 DIAGNOSIS — D693 Immune thrombocytopenic purpura: Secondary | ICD-10-CM

## 2020-06-27 DIAGNOSIS — Z5112 Encounter for antineoplastic immunotherapy: Secondary | ICD-10-CM | POA: Diagnosis not present

## 2020-06-27 LAB — CBC AND DIFFERENTIAL
HCT: 27 — AB (ref 41–53)
Hemoglobin: 8.8 — AB (ref 13.5–17.5)
Neutrophils Absolute: 6.24
Platelets: 7 — AB (ref 150–399)
WBC: 7.7

## 2020-06-27 LAB — CBC: RBC: 2.81 — AB (ref 3.87–5.11)

## 2020-06-27 MED ORDER — ROMIPLOSTIM INJECTION 500 MCG
5.0000 ug/kg | Freq: Once | SUBCUTANEOUS | Status: AC
  Administered 2020-06-27: 420 ug via SUBCUTANEOUS
  Filled 2020-06-27: qty 0.84

## 2020-06-27 NOTE — Telephone Encounter (Signed)
Hematology called with critical platelet of 7. Lenna Sciara, NP and Dr. Bobby Rumpf made aware.

## 2020-06-27 NOTE — Telephone Encounter (Signed)
Per 2/10 los next appt sched and given to patient 

## 2020-06-27 NOTE — Patient Instructions (Signed)
Romiplostim injection What is this medicine? ROMIPLOSTIM (roe mi PLOE stim) helps your body make more platelets. This medicine is used to treat low platelets caused by chronic idiopathic thrombocytopenic purpura (ITP) or a bone marrow syndrome caused by radiation sickness. This medicine may be used for other purposes; ask your health care provider or pharmacist if you have questions. COMMON BRAND NAME(S): Nplate What should I tell my health care provider before I take this medicine? They need to know if you have any of these conditions:  blood clots  myelodysplastic syndrome  an unusual or allergic reaction to romiplostim, mannitol, other medicines, foods, dyes, or preservatives  pregnant or trying to get pregnant  breast-feeding How should I use this medicine? This medicine is injected under the skin. It is given by a health care provider in a hospital or clinic setting. A special MedGuide will be given to you before each treatment. Be sure to read this information carefully each time. Talk to your health care provider about the use of this medicine in children. While it may be prescribed for children as young as newborns for selected conditions, precautions do apply. Overdosage: If you think you have taken too much of this medicine contact a poison control center or emergency room at once. NOTE: This medicine is only for you. Do not share this medicine with others. What if I miss a dose? Keep appointments for follow-up doses. It is important not to miss your dose. Call your health care provider if you are unable to keep an appointment. What may interact with this medicine? Interactions are not expected. This list may not describe all possible interactions. Give your health care provider a list of all the medicines, herbs, non-prescription drugs, or dietary supplements you use. Also tell them if you smoke, drink alcohol, or use illegal drugs. Some items may interact with your  medicine. What should I watch for while using this medicine? Visit your health care provider for regular checks on your progress. You may need blood work done while you are taking this medicine. Your condition will be monitored carefully while you are receiving this medicine. It is important not to miss any appointments. What side effects may I notice from receiving this medicine? Side effects that you should report to your doctor or health care professional as soon as possible:  allergic reactions (skin rash, itching or hives; swelling of the face, lips, or tongue)  bleeding (bloody or black, tarry stools; red or dark brown urine; spitting up blood or brown material that looks like coffee grounds; red spots on the skin; unusual bruising or bleeding from the eyes, gums, or nose)  blood clot (chest pain; shortness of breath; pain, swelling, or warmth in the leg)  stroke (changes in vision; confusion; trouble speaking or understanding; severe headaches; sudden numbness or weakness of the face, arm or leg; trouble walking; dizziness; loss of balance or coordination) Side effects that usually do not require medical attention (report to your doctor or health care professional if they continue or are bothersome):  diarrhea  dizziness  headache  joint pain  muscle pain  stomach pain  trouble sleeping This list may not describe all possible side effects. Call your doctor for medical advice about side effects. You may report side effects to FDA at 1-800-FDA-1088. Where should I keep my medicine? This medicine is given in a hospital or clinic. It will not be stored at home. NOTE: This sheet is a summary. It may not cover all possible   information. If you have questions about this medicine, talk to your doctor, pharmacist, or health care provider.  2021 Elsevier/Gold Standard (2019-06-19 10:28:13)  

## 2020-06-28 ENCOUNTER — Ambulatory Visit: Payer: Medicare Other

## 2020-06-28 ENCOUNTER — Inpatient Hospital Stay: Payer: Medicare Other

## 2020-06-28 NOTE — Progress Notes (Signed)
Patient was approved for free Tavalisse through [employer], 06/27/2020-05/17/2021. I spoke with [employer] today and the pharmacist should be reaching out to review and schedule delivery.

## 2020-07-01 ENCOUNTER — Inpatient Hospital Stay: Payer: Medicare Other

## 2020-07-01 ENCOUNTER — Other Ambulatory Visit: Payer: Self-pay

## 2020-07-01 ENCOUNTER — Other Ambulatory Visit: Payer: Self-pay | Admitting: Hematology and Oncology

## 2020-07-01 VITALS — BP 141/94 | HR 72 | Temp 98.4°F | Resp 18 | Ht 68.0 in | Wt 183.2 lb

## 2020-07-01 DIAGNOSIS — Z5112 Encounter for antineoplastic immunotherapy: Secondary | ICD-10-CM | POA: Diagnosis not present

## 2020-07-01 DIAGNOSIS — D649 Anemia, unspecified: Secondary | ICD-10-CM | POA: Diagnosis not present

## 2020-07-01 DIAGNOSIS — D693 Immune thrombocytopenic purpura: Secondary | ICD-10-CM

## 2020-07-01 LAB — CBC AND DIFFERENTIAL
HCT: 28 — AB (ref 41–53)
Hemoglobin: 9.3 — AB (ref 13.5–17.5)
Neutrophils Absolute: 4.05
Platelets: 30 — AB (ref 150–399)
WBC: 5

## 2020-07-01 LAB — CBC: RBC: 2.88 — AB (ref 3.87–5.11)

## 2020-07-01 MED ORDER — DIPHENHYDRAMINE HCL 50 MG/ML IJ SOLN
INTRAMUSCULAR | Status: AC
  Filled 2020-07-01: qty 1

## 2020-07-01 MED ORDER — DIPHENHYDRAMINE HCL 50 MG/ML IJ SOLN
25.0000 mg | Freq: Once | INTRAMUSCULAR | Status: AC
  Administered 2020-07-01: 25 mg via INTRAVENOUS

## 2020-07-01 MED ORDER — SODIUM CHLORIDE 0.9 % IV SOLN
Freq: Once | INTRAVENOUS | Status: AC
  Filled 2020-07-01: qty 250

## 2020-07-01 MED ORDER — SODIUM CHLORIDE 0.9 % IV SOLN
375.0000 mg/m2 | Freq: Once | INTRAVENOUS | Status: AC
  Administered 2020-07-01: 700 mg via INTRAVENOUS
  Filled 2020-07-01: qty 50

## 2020-07-01 MED ORDER — ACETAMINOPHEN 325 MG PO TABS: 650.0000 mg | ORAL_TABLET | Freq: Once | ORAL | Status: DC

## 2020-07-01 NOTE — Progress Notes (Signed)
1344:PT STABLE AT TIME OF DISCHARGE °

## 2020-07-01 NOTE — Addendum Note (Signed)
Addended by: Juanetta Beets on: 07/01/2020 12:49 PM   Modules accepted: Orders

## 2020-07-02 ENCOUNTER — Ambulatory Visit: Payer: Medicare Other

## 2020-07-04 ENCOUNTER — Inpatient Hospital Stay: Payer: Medicare Other

## 2020-07-04 ENCOUNTER — Inpatient Hospital Stay (INDEPENDENT_AMBULATORY_CARE_PROVIDER_SITE_OTHER): Payer: Medicare Other | Admitting: Oncology

## 2020-07-04 ENCOUNTER — Telehealth: Payer: Self-pay | Admitting: Oncology

## 2020-07-04 ENCOUNTER — Other Ambulatory Visit: Payer: Self-pay | Admitting: Oncology

## 2020-07-04 ENCOUNTER — Other Ambulatory Visit: Payer: Self-pay | Admitting: Hematology and Oncology

## 2020-07-04 ENCOUNTER — Other Ambulatory Visit: Payer: Self-pay

## 2020-07-04 VITALS — BP 133/77 | HR 73 | Temp 98.7°F | Resp 18 | Ht 68.0 in | Wt 180.8 lb

## 2020-07-04 VITALS — BP 84/51 | HR 77 | Temp 98.2°F | Resp 16 | Ht 68.0 in | Wt 180.8 lb

## 2020-07-04 DIAGNOSIS — D693 Immune thrombocytopenic purpura: Secondary | ICD-10-CM

## 2020-07-04 DIAGNOSIS — Z5112 Encounter for antineoplastic immunotherapy: Secondary | ICD-10-CM | POA: Diagnosis not present

## 2020-07-04 DIAGNOSIS — D649 Anemia, unspecified: Secondary | ICD-10-CM | POA: Diagnosis not present

## 2020-07-04 LAB — CBC AND DIFFERENTIAL
HCT: 28 — AB (ref 41–53)
Hemoglobin: 9.5 — AB (ref 13.5–17.5)
Neutrophils Absolute: 4.26
Platelets: 254 (ref 150–399)
WBC: 5.6

## 2020-07-04 LAB — CBC
MCV: 96 — AB (ref 80–94)
RBC: 2.95 — AB (ref 3.87–5.11)

## 2020-07-04 MED ORDER — ROMIPLOSTIM 250 MCG ~~LOC~~ SOLR
2.5000 ug/kg | Freq: Once | SUBCUTANEOUS | Status: AC
  Administered 2020-07-04: 205 ug via SUBCUTANEOUS
  Filled 2020-07-04: qty 0.41

## 2020-07-04 NOTE — Patient Instructions (Signed)
Romiplostim injection What is this medicine? ROMIPLOSTIM (roe mi PLOE stim) helps your body make more platelets. This medicine is used to treat low platelets caused by chronic idiopathic thrombocytopenic purpura (ITP) or a bone marrow syndrome caused by radiation sickness. This medicine may be used for other purposes; ask your health care provider or pharmacist if you have questions. COMMON BRAND NAME(S): Nplate What should I tell my health care provider before I take this medicine? They need to know if you have any of these conditions:  blood clots  myelodysplastic syndrome  an unusual or allergic reaction to romiplostim, mannitol, other medicines, foods, dyes, or preservatives  pregnant or trying to get pregnant  breast-feeding How should I use this medicine? This medicine is injected under the skin. It is given by a health care provider in a hospital or clinic setting. A special MedGuide will be given to you before each treatment. Be sure to read this information carefully each time. Talk to your health care provider about the use of this medicine in children. While it may be prescribed for children as young as newborns for selected conditions, precautions do apply. Overdosage: If you think you have taken too much of this medicine contact a poison control center or emergency room at once. NOTE: This medicine is only for you. Do not share this medicine with others. What if I miss a dose? Keep appointments for follow-up doses. It is important not to miss your dose. Call your health care provider if you are unable to keep an appointment. What may interact with this medicine? Interactions are not expected. This list may not describe all possible interactions. Give your health care provider a list of all the medicines, herbs, non-prescription drugs, or dietary supplements you use. Also tell them if you smoke, drink alcohol, or use illegal drugs. Some items may interact with your  medicine. What should I watch for while using this medicine? Visit your health care provider for regular checks on your progress. You may need blood work done while you are taking this medicine. Your condition will be monitored carefully while you are receiving this medicine. It is important not to miss any appointments. What side effects may I notice from receiving this medicine? Side effects that you should report to your doctor or health care professional as soon as possible:  allergic reactions (skin rash, itching or hives; swelling of the face, lips, or tongue)  bleeding (bloody or black, tarry stools; red or dark brown urine; spitting up blood or brown material that looks like coffee grounds; red spots on the skin; unusual bruising or bleeding from the eyes, gums, or nose)  blood clot (chest pain; shortness of breath; pain, swelling, or warmth in the leg)  stroke (changes in vision; confusion; trouble speaking or understanding; severe headaches; sudden numbness or weakness of the face, arm or leg; trouble walking; dizziness; loss of balance or coordination) Side effects that usually do not require medical attention (report to your doctor or health care professional if they continue or are bothersome):  diarrhea  dizziness  headache  joint pain  muscle pain  stomach pain  trouble sleeping This list may not describe all possible side effects. Call your doctor for medical advice about side effects. You may report side effects to FDA at 1-800-FDA-1088. Where should I keep my medicine? This medicine is given in a hospital or clinic. It will not be stored at home. NOTE: This sheet is a summary. It may not cover all possible   information. If you have questions about this medicine, talk to your doctor, pharmacist, or health care provider.  2021 Elsevier/Gold Standard (2019-06-19 10:28:13)  

## 2020-07-04 NOTE — Telephone Encounter (Signed)
Per 2/17 los next appt sched and given to patient 

## 2020-07-08 ENCOUNTER — Other Ambulatory Visit: Payer: Self-pay

## 2020-07-08 ENCOUNTER — Inpatient Hospital Stay: Payer: Medicare Other

## 2020-07-08 VITALS — BP 143/94 | HR 68 | Temp 97.9°F | Resp 18 | Ht 68.0 in | Wt 180.0 lb

## 2020-07-08 DIAGNOSIS — Z5112 Encounter for antineoplastic immunotherapy: Secondary | ICD-10-CM | POA: Diagnosis not present

## 2020-07-08 DIAGNOSIS — D693 Immune thrombocytopenic purpura: Secondary | ICD-10-CM

## 2020-07-08 MED ORDER — ACETAMINOPHEN 325 MG PO TABS: 650.0000 mg | ORAL_TABLET | Freq: Once | ORAL | Status: DC

## 2020-07-08 MED ORDER — DIPHENHYDRAMINE HCL 50 MG/ML IJ SOLN
25.0000 mg | Freq: Once | INTRAMUSCULAR | Status: AC
  Administered 2020-07-08: 25 mg via INTRAVENOUS

## 2020-07-08 MED ORDER — SODIUM CHLORIDE 0.9 % IV SOLN
Freq: Once | INTRAVENOUS | Status: AC
  Filled 2020-07-08: qty 250

## 2020-07-08 MED ORDER — RITUXIMAB-PVVR CHEMO 500 MG/50ML IV SOLN
375.0000 mg/m2 | Freq: Once | INTRAVENOUS | Status: AC
  Administered 2020-07-08: 700 mg via INTRAVENOUS
  Filled 2020-07-08: qty 50

## 2020-07-08 MED ORDER — DIPHENHYDRAMINE HCL 50 MG/ML IJ SOLN
INTRAMUSCULAR | Status: AC
  Filled 2020-07-08: qty 1

## 2020-07-08 NOTE — Patient Instructions (Signed)
Rituximab; Hyaluronidase injection What is this medicine? RITUXIMAB; HYALURONIDASE (ri TUX i mab / hye al ur ON i dase) is used to treat non-Hodgkin lymphoma and chronic lymphocytic leukemia. Rituximab is a monoclonal antibody. Hyaluronidase is used to improve the effects of rituximab. This medicine may be used for other purposes; ask your health care provider or pharmacist if you have questions. COMMON BRAND NAME(S): Rituxan Hycela What should I tell my health care provider before I take this medicine? They need to know if you have any of these conditions:  heart disease  infection (especially a virus infection such as hepatitis B, chickenpox, cold sores, or herpes)  immune system problems  irregular heartbeat  kidney disease  lung or breathing disease, like asthma  recently received or scheduled to receive a vaccine  an unusual or allergic reaction to rituximab, rituximab/hyaluronidase, mouse proteins, other medicines, foods, dyes, or preservatives  pregnant or trying to get pregnant  breast-feeding How should I use this medicine? This medicine is for injection under the skin. It is given by a health care professional in a hospital or clinic setting. A special MedGuide will be given to you before each treatment. Be sure to read this information carefully each time. Talk to your pediatrician regarding the use of this medicine in children. Special care may be needed. Overdosage: If you think you have taken too much of this medicine contact a poison control center or emergency room at once. NOTE: This medicine is only for you. Do not share this medicine with others. What if I miss a dose? It is important not to miss your dose. Call your doctor or health care professional if you are unable to keep an appointment. What may interact with this medicine? This medicine may interact with the following medications:  cisplatin  live virus vaccines This list may not describe all  possible interactions. Give your health care provider a list of all the medicines, herbs, non-prescription drugs, or dietary supplements you use. Also tell them if you smoke, drink alcohol, or use illegal drugs. Some items may interact with your medicine. What should I watch for while using this medicine? Your condition will be monitored carefully while you are receiving this medicine. You may need blood work done while you are taking this medicine. This medicine can cause serious allergic reactions. To reduce your risk you may need to take medicine before treatment with this medicine. Take your medicine as directed. In some patients, this medicine may cause a serious brain infection that may cause death. If you have any problems seeing, thinking, speaking, walking, or standing, tell your doctor right away. If you cannot reach your doctor, urgently seek other source of medical care. Call your doctor or health care professional for advice if you get a fever, chills or sore throat, or other symptoms of a cold or flu. Do not treat yourself. This drug decreases your body's ability to fight infections. Try to avoid being around people who are sick. Do not become pregnant while taking this medicine or for 12 months after stopping it. Women should inform their doctor if they wish to become pregnant or think they might be pregnant. There is a potential for serious side effects to an unborn child. Talk to your health care professional or pharmacist for more information. Do not breast-feed an infant while taking this medicine or for at least 6 months after stopping it. What side effects may I notice from receiving this medicine? Side effects that you should report   to your doctor or health care professional as soon as possible:  allergic reactions like skin rash, itching or hives; swelling of the face, lips, or tongue  breathing problems  chest pain  changes in vision  diarrhea  dizziness  headache with  fever, neck stiffness, sensitivity to light, nausea, or confusion  fast, irregular heartbeat  loss of memory  low blood counts - this medicine may decrease the number of white blood cells, red blood cells and platelets. You may be at increased risk for infections and bleeding.  mouth sores  problems with balance, talking, or walking  redness, blistering, peeling or loosening of the skin, including inside the mouth  signs of infection - fever or chills, cough, sore throat, pain or difficulty passing urine  signs and symptoms of kidney injury like trouble passing urine or change in the amount of urine  signs and symptoms of liver injury like dark yellow or brown urine; general ill feeling or flu-like symptoms; light-colored stools; loss of appetite; nausea; right upper belly pain; unusually weak or tired; yellowing of the eyes or skin  stomach pain  swelling of the ankles, feet, hands  unusual bleeding or bruising  vomiting Side effects that usually do not require medical attention (report these to your doctor or health care professional if they continue or are bothersome):  headache  joint pain  muscle cramps or muscle pain  nausea  pain, redness, or irritation at site where injected  tiredness This list may not describe all possible side effects. Call your doctor for medical advice about side effects. You may report side effects to FDA at 1-800-FDA-1088. Where should I keep my medicine? This drug is given in a hospital or clinic and will not be stored at home. NOTE: This sheet is a summary. It may not cover all possible information. If you have questions about this medicine, talk to your doctor, pharmacist, or health care provider.  2021 Elsevier/Gold Standard (2017-04-16 13:12:25)  

## 2020-07-08 NOTE — Progress Notes (Signed)
1305, PT STABLE AT TIME OF DISCHARGE

## 2020-07-09 ENCOUNTER — Ambulatory Visit: Payer: Medicare Other

## 2020-07-10 ENCOUNTER — Telehealth: Payer: Self-pay

## 2020-07-10 NOTE — Telephone Encounter (Signed)
I called pt to ask if he had received the Tavalisse (Fostamatinib) tablets yet. Pt replied he does have the tablets, but has not started them yet. "Dr Bobby Rumpf told me to not start them until I see him". Pt has appt with Dr Bobby Rumpf on Fri, 07/12/20.

## 2020-07-11 ENCOUNTER — Other Ambulatory Visit: Payer: Self-pay

## 2020-07-11 ENCOUNTER — Other Ambulatory Visit: Payer: Self-pay | Admitting: Pharmacist

## 2020-07-11 ENCOUNTER — Inpatient Hospital Stay: Payer: Medicare Other

## 2020-07-11 ENCOUNTER — Other Ambulatory Visit: Payer: Self-pay | Admitting: Hematology and Oncology

## 2020-07-11 DIAGNOSIS — D649 Anemia, unspecified: Secondary | ICD-10-CM | POA: Diagnosis not present

## 2020-07-11 DIAGNOSIS — Z930 Tracheostomy status: Secondary | ICD-10-CM | POA: Diagnosis not present

## 2020-07-11 DIAGNOSIS — Z430 Encounter for attention to tracheostomy: Secondary | ICD-10-CM | POA: Diagnosis not present

## 2020-07-11 DIAGNOSIS — D693 Immune thrombocytopenic purpura: Secondary | ICD-10-CM | POA: Diagnosis not present

## 2020-07-11 LAB — CHG BLOOD COUNT COMPLETE AUTOMATED: Platelets: 1412

## 2020-07-11 LAB — CBC AND DIFFERENTIAL
HCT: 29 — AB (ref 41–53)
Hemoglobin: 9.6 — AB (ref 13.5–17.5)
Neutrophils Absolute: 2.93
WBC: 6.1

## 2020-07-11 LAB — CBC: RBC: 3.04 — AB (ref 3.87–5.11)

## 2020-07-11 NOTE — Progress Notes (Deleted)
[facility]  [address] [place],  [place]  [address] [phone]  Clinic Day:  07/11/2020  Referring physician: Mateo Flow, MD   HISTORY OF PRESENT ILLNESS:  The patient is a 70 y.o. male with splenectomy-refractory ITP.  Over the past few weeks, he has received weekly Rituxan x 4 and Nplate  Mcg/kg.  Earlier this week, his CBC showed a platelet count of 241; yesterday, his platelets were astronomically high of 1412 to where his weekly Nplate dose was held.  He comes in today for routine followup.  Overall,the patient  PHYSICAL EXAM:  There were no vitals taken for this visit. Wt Readings from Last 3 Encounters:  07/08/20 180 lb (81.6 kg)  07/04/20 180 lb 12 oz (82 kg)  07/04/20 180 lb 12.8 oz (82 kg)   There is no height or weight on file to calculate BMI. Performance status (ECOG): 1 - Symptomatic but completely ambulatory Physical Exam Constitutional:      Appearance: Normal appearance. He is not ill-appearing.  HENT:     Nose:     Right Nostril: No epistaxis.     Left Nostril: No epistaxis.     Mouth/Throat:     Mouth: Mucous membranes are moist.     Pharynx: Oropharynx is clear. No oropharyngeal exudate or posterior oropharyngeal erythema.  Neck:     Trachea: Tracheostomy (old blood is seen in the area) present.  Cardiovascular:     Rate and Rhythm: Normal rate and regular rhythm.     Heart sounds: No murmur heard. No friction rub. No gallop.   Pulmonary:     Effort: Pulmonary effort is normal. No respiratory distress.     Breath sounds: Normal breath sounds. No wheezing, rhonchi or rales.  Chest:  Breasts:     Right: No axillary adenopathy or supraclavicular adenopathy.     Left: No axillary adenopathy or supraclavicular adenopathy.    Abdominal:     General: Bowel sounds are normal. There is no distension.     Palpations: Abdomen is soft. There is no mass.     Tenderness: There is no abdominal tenderness.   Musculoskeletal:        General: No swelling.     Right lower leg: No edema.     Left lower leg: No edema.  Lymphadenopathy:     Cervical: No cervical adenopathy.     Upper Body:     Right upper body: No supraclavicular or axillary adenopathy.     Left upper body: No supraclavicular or axillary adenopathy.     Lower Body: No right inguinal adenopathy. No left inguinal adenopathy.  Skin:    General: Skin is warm.     Coloration: Skin is not jaundiced.     Findings: No lesion or rash.  Neurological:     General: No focal deficit present.     Mental Status: He is alert and oriented to person, place, and time. Mental status is at baseline.     Cranial Nerves: Cranial nerves are intact.  Psychiatric:        Mood and Affect: Mood normal.        Behavior: Behavior normal.        Thought Content: Thought content normal.     LABS:      CBC Latest Ref Rng & Units 07/11/2020 07/04/2020 07/01/2020  WBC - 6.1 5.6 5.0  Hemoglobin 13.5 - 17.5 9.6(A) 9.5(A) 9.3(A)  Hematocrit 41 - 53 29(A) 28(A) 28(A)  Platelets - 1,412 254 30(A)   CMP Latest Ref Rng & Units 06/17/2020  BUN 4 - 21 29(A)  Creatinine 0.6 - 1.3 0.5(A)  Sodium 137 - 147 133(A)  Potassium 3.4 - 5.3 3.4  Chloride 99 - 108 96(A)  CO2 13 - 22 28(A)  Calcium 8.7 - 10.7 8.1(A)    ASSESSMENT & PLAN:  Assessment/Plan:  A 70 y.o. male with a severe form of splenectomy-refractory ITP that fortunately has resonded to his Nplate and Rituxan therapies.    The patient understands all the plans discussed today and is in agreement with them.      Macarthur Critchley, MD

## 2020-07-12 ENCOUNTER — Inpatient Hospital Stay: Payer: Medicare Other

## 2020-07-12 ENCOUNTER — Inpatient Hospital Stay: Payer: Medicare Other | Admitting: Oncology

## 2020-07-12 ENCOUNTER — Telehealth: Payer: Self-pay | Admitting: Oncology

## 2020-07-12 NOTE — Telephone Encounter (Signed)
07/12/20 Spoke with patient and reschedule appt

## 2020-07-13 NOTE — Progress Notes (Signed)
[facility]  [address] [place],  [place]  [address] [phone]  Clinic Day:  07/04/2020  Referring physician: Mateo Flow, MD   HISTORY OF PRESENT ILLNESS:  The patient is a 70 y.o. male with splenectomy-refractory ITP.  Despite receiving multiple units of platelets, as well as IVIG and Decadron, his platelets have recently remained below 10.  He has  received 3 doses of weekly Rituxan.  He has also received 3 doses of Nplate.  The first dose was at 1 mcg/kg; the 2nd and 3rd dose were at 5 mcg/kg as his platelets remained <10.  He comes in today to reassess his severely refractory ITP.  The patient claims his bleeding around his tracheostomy site has dissipated. He also denies having any epistaxis.  He denies having other severe bleeding/bruising episodes over the past few days.  PHYSICAL EXAM:  Blood pressure (!) 84/51, pulse 77, temperature 98.2 F (36.8 C), resp. rate 16, height 5\' 8"  (1.727 m), weight 180 lb 12.8 oz (82 kg), SpO2 95 %. Body mass index is 27.49 kg/m. Performance status (ECOG): 1 - Symptomatic but completely ambulatory Physical Exam Constitutional:      Appearance: Normal appearance. He is not ill-appearing.  HENT:     Nose:     Right Nostril: No epistaxis.     Left Nostril: No epistaxis.     Mouth/Throat:     Mouth: Mucous membranes are moist.     Pharynx: Oropharynx is clear. No oropharyngeal exudate or posterior oropharyngeal erythema.  Neck:     Trachea: Tracheostomy (old blood is seen in the area) present.  Cardiovascular:     Rate and Rhythm: Normal rate and regular rhythm.     Heart sounds: No murmur heard. No friction rub. No gallop.   Pulmonary:     Effort: Pulmonary effort is normal. No respiratory distress.     Breath sounds: Normal breath sounds. No wheezing, rhonchi or rales.  Chest:  Breasts:     Right: No axillary adenopathy or supraclavicular adenopathy.     Left: No axillary adenopathy or  supraclavicular adenopathy.    Abdominal:     General: Bowel sounds are normal. There is no distension.     Palpations: Abdomen is soft. There is no mass.     Tenderness: There is no abdominal tenderness.  Musculoskeletal:        General: No swelling.     Right lower leg: No edema.     Left lower leg: No edema.  Lymphadenopathy:     Cervical: No cervical adenopathy.     Upper Body:     Right upper body: No supraclavicular or axillary adenopathy.     Left upper body: No supraclavicular or axillary adenopathy.     Lower Body: No right inguinal adenopathy. No left inguinal adenopathy.  Skin:    General: Skin is warm.     Coloration: Skin is not jaundiced.     Findings: No lesion or rash.  Neurological:     General: No focal deficit present.     Mental Status: He is alert and oriented to person, place, and time. Mental status is at baseline.     Cranial Nerves: Cranial nerves are intact.  Psychiatric:        Mood and Affect: Mood normal.        Behavior: Behavior normal.        Thought Content: Thought content normal.     LABS:  ASSESSMENT & PLAN:  Assessment/Plan:  A 70 y.o. male with a severe form of splenectomy-refractory ITP that has been recalcitrant to multiple interventions.  I am very pleased as his platelets have returned to normal.  Based upon this, I will decrease his Nplate to 2.5 mcg/kg this week.  Of note, he is scheduled to receive his 4th and final week of Rituxan early next week.  I will see him back in 1 week to reassess his platelets. The patient understands all the plans discussed today and is in agreement with them.     Tharon Kitch Macarthur Critchley, MD

## 2020-07-16 ENCOUNTER — Ambulatory Visit: Payer: Medicare Other

## 2020-07-16 DIAGNOSIS — D693 Immune thrombocytopenic purpura: Secondary | ICD-10-CM | POA: Diagnosis not present

## 2020-07-16 DIAGNOSIS — Z8679 Personal history of other diseases of the circulatory system: Secondary | ICD-10-CM | POA: Diagnosis not present

## 2020-07-16 DIAGNOSIS — L308 Other specified dermatitis: Secondary | ICD-10-CM | POA: Diagnosis not present

## 2020-07-16 DIAGNOSIS — Z930 Tracheostomy status: Secondary | ICD-10-CM | POA: Diagnosis not present

## 2020-07-17 ENCOUNTER — Other Ambulatory Visit: Payer: Self-pay | Admitting: Oncology

## 2020-07-17 DIAGNOSIS — D693 Immune thrombocytopenic purpura: Secondary | ICD-10-CM

## 2020-07-17 NOTE — Progress Notes (Signed)
[facility]  [address] [place],  [place]  [address] [phone]  Clinic Day:  07/18/2020  Referring physician: Mateo Flow, MD   HISTORY OF PRESENT ILLNESS:  The patient is a 70 y.o. male with splenectomy-refractory ITP.  Finally, after receiving 4 weekly doses of Rituxan and rapidly escalating doses of Nplate, this gentleman's platelets rose to more than satisfactory levels.  As his platelets were 1421 last week, his Nplate dose was held.  He comes in today to reassess his platelets.  Since his last visit, the patient has been doing well.  He denies having bleeding around his tracheostomy site, epistaxis, or other symptoms/findings which concern him for his severe thrombocytopenia having returned.    PHYSICAL EXAM:  Blood pressure (!) 153/86, pulse 72, temperature 97.7 F (36.5 C), resp. rate 16, height 5\' 8"  (1.727 m), weight 178 lb 12.8 oz (81.1 kg), SpO2 95 %. Body mass index is 27.19 kg/m. Performance status (ECOG): 1 - Symptomatic but completely ambulatory Physical Exam Constitutional:      Appearance: Normal appearance. He is not ill-appearing.  HENT:     Nose:     Right Nostril: No epistaxis.     Left Nostril: No epistaxis.     Mouth/Throat:     Mouth: Mucous membranes are moist.     Pharynx: Oropharynx is clear. No oropharyngeal exudate or posterior oropharyngeal erythema.  Neck:     Trachea: Tracheostomy (old blood is seen in the area) present.  Cardiovascular:     Rate and Rhythm: Normal rate and regular rhythm.     Heart sounds: No murmur heard. No friction rub. No gallop.   Pulmonary:     Effort: Pulmonary effort is normal. No respiratory distress.     Breath sounds: Normal breath sounds. No wheezing, rhonchi or rales.  Chest:  Breasts:     Right: No axillary adenopathy or supraclavicular adenopathy.     Left: No axillary adenopathy or supraclavicular adenopathy.    Abdominal:     General: Bowel sounds are normal.  There is no distension.     Palpations: Abdomen is soft. There is no mass.     Tenderness: There is no abdominal tenderness.  Musculoskeletal:        General: No swelling.     Right lower leg: No edema.     Left lower leg: No edema.  Lymphadenopathy:     Cervical: No cervical adenopathy.     Upper Body:     Right upper body: No supraclavicular or axillary adenopathy.     Left upper body: No supraclavicular or axillary adenopathy.     Lower Body: No right inguinal adenopathy. No left inguinal adenopathy.  Skin:    General: Skin is warm.     Coloration: Skin is not jaundiced.     Findings: No lesion or rash.  Neurological:     General: No focal deficit present.     Mental Status: He is alert and oriented to person, place, and time. Mental status is at baseline.     Cranial Nerves: Cranial nerves are intact.  Psychiatric:        Mood and Affect: Mood normal.        Behavior: Behavior normal.        Thought Content: Thought content normal.    LABS:    ASSESSMENT & PLAN:  Assessment/Plan:  A 70 y.o. male with a severe form of splenectomy-refractory ITP that had been recalcitrant to multiple  interventions, but ultimately responded to a combination of Rituxan and Nplate.  I am pleased as his platelets remain very elevated.  Based upon his levels today, he will remain off any form of therapy for his ITP.  Moving forward, his CBC will be checked in 2 weeks.  I will see him back in 1 month for repeat clinical assessment.  The patient understands all the plans discussed today and is in agreement with them.     Kendel Pesnell Macarthur Critchley, MD

## 2020-07-18 ENCOUNTER — Inpatient Hospital Stay: Payer: Medicare Other | Attending: Oncology

## 2020-07-18 ENCOUNTER — Other Ambulatory Visit: Payer: Self-pay

## 2020-07-18 ENCOUNTER — Telehealth: Payer: Self-pay | Admitting: Oncology

## 2020-07-18 ENCOUNTER — Inpatient Hospital Stay (INDEPENDENT_AMBULATORY_CARE_PROVIDER_SITE_OTHER): Payer: Medicare Other | Admitting: Oncology

## 2020-07-18 ENCOUNTER — Other Ambulatory Visit: Payer: Self-pay | Admitting: Oncology

## 2020-07-18 ENCOUNTER — Encounter: Payer: Self-pay | Admitting: Oncology

## 2020-07-18 ENCOUNTER — Other Ambulatory Visit: Payer: Self-pay | Admitting: Hematology and Oncology

## 2020-07-18 ENCOUNTER — Inpatient Hospital Stay: Payer: Medicare Other

## 2020-07-18 VITALS — BP 153/86 | HR 72 | Temp 97.7°F | Resp 16 | Ht 68.0 in | Wt 178.8 lb

## 2020-07-18 DIAGNOSIS — D693 Immune thrombocytopenic purpura: Secondary | ICD-10-CM

## 2020-07-18 DIAGNOSIS — D649 Anemia, unspecified: Secondary | ICD-10-CM | POA: Diagnosis not present

## 2020-07-18 LAB — CBC AND DIFFERENTIAL
HCT: 32 — AB (ref 41–53)
Hemoglobin: 10.5 — AB (ref 13.5–17.5)
Neutrophils Absolute: 4.19
Platelets: 1265 — AB (ref 150–399)
WBC: 7.9

## 2020-07-18 LAB — CBC
MCV: 94 (ref 80–94)
RBC: 3.38 — AB (ref 3.87–5.11)

## 2020-07-18 NOTE — Telephone Encounter (Signed)
07/18/20 spoke with patient and sched all appts

## 2020-07-23 ENCOUNTER — Telehealth: Payer: Self-pay | Admitting: Oncology

## 2020-07-23 ENCOUNTER — Ambulatory Visit: Payer: Medicare Other

## 2020-07-23 DIAGNOSIS — I420 Dilated cardiomyopathy: Secondary | ICD-10-CM | POA: Insufficient documentation

## 2020-07-23 DIAGNOSIS — I272 Pulmonary hypertension, unspecified: Secondary | ICD-10-CM | POA: Insufficient documentation

## 2020-07-23 DIAGNOSIS — R042 Hemoptysis: Secondary | ICD-10-CM | POA: Insufficient documentation

## 2020-07-23 HISTORY — DX: Hemoptysis: R04.2

## 2020-07-23 HISTORY — DX: Dilated cardiomyopathy: I42.0

## 2020-07-23 HISTORY — DX: Pulmonary hypertension, unspecified: I27.20

## 2020-07-23 NOTE — Telephone Encounter (Signed)
07/23/20 Spoke with patient and cancelled inj that was sched on 07/25/20

## 2020-07-25 ENCOUNTER — Inpatient Hospital Stay: Payer: Medicare Other

## 2020-08-01 ENCOUNTER — Other Ambulatory Visit: Payer: Self-pay

## 2020-08-01 ENCOUNTER — Other Ambulatory Visit: Payer: Self-pay | Admitting: Hematology and Oncology

## 2020-08-01 ENCOUNTER — Inpatient Hospital Stay: Payer: Medicare Other

## 2020-08-01 DIAGNOSIS — D693 Immune thrombocytopenic purpura: Secondary | ICD-10-CM

## 2020-08-01 DIAGNOSIS — D649 Anemia, unspecified: Secondary | ICD-10-CM | POA: Diagnosis not present

## 2020-08-01 LAB — CBC AND DIFFERENTIAL
HCT: 33 — AB (ref 41–53)
Hemoglobin: 10.5 — AB (ref 13.5–17.5)
Neutrophils Absolute: 4.84
Platelets: 246 (ref 150–399)
WBC: 7.8

## 2020-08-01 LAB — CBC
MCV: 92 (ref 80–94)
RBC: 3.52 — AB (ref 3.87–5.11)

## 2020-08-12 DIAGNOSIS — I081 Rheumatic disorders of both mitral and tricuspid valves: Secondary | ICD-10-CM | POA: Diagnosis not present

## 2020-08-18 NOTE — Progress Notes (Signed)
[facility]  [address] [place],  [place]  [address] [phone]  Clinic Day:  08/19/2020  Referring physician: Mateo Flow, MD   HISTORY OF PRESENT ILLNESS:  The patient is a 70 y.o. male with splenectomy-refractory ITP.  Finally, after receiving 4 weekly doses of Rituxan and rapidly escalating doses of Nplate, this gentleman's platelets rose to more than satisfactory levels.  Due to his extremely high platelets, the patient has been held from any form of therapy over these past few weeks.  He brings to my attention that he fell this weekend, which led to significant bruises on his face.  However, his wife was encouraged by the fact that he did not have any significant epistaxis or other bruising, which would have definitely occurred if he had severe thrombocytopenia like previously.  He denies having bleeding around his tracheostomy site, epistaxis, or other symptoms/findings which concern him for his severe thrombocytopenia having returned.    PHYSICAL EXAM:  Blood pressure 109/65, pulse 68, temperature 98.2 F (36.8 C), resp. rate 16, height 5\' 8"  (1.727 m), weight 183 lb 14.4 oz (83.4 kg), SpO2 96 %. Body mass index is 27.96 kg/m. Performance status (ECOG): 1 - Symptomatic but completely ambulatory Physical Exam Constitutional:      Appearance: Normal appearance. He is not ill-appearing.  HENT:     Nose:     Right Nostril: No epistaxis.     Left Nostril: No epistaxis.     Mouth/Throat:     Mouth: Mucous membranes are moist.     Pharynx: Oropharynx is clear. No oropharyngeal exudate or posterior oropharyngeal erythema.  Neck:     Trachea: Tracheostomy present.  Cardiovascular:     Rate and Rhythm: Normal rate and regular rhythm.     Heart sounds: No murmur heard. No friction rub. No gallop.   Pulmonary:     Effort: Pulmonary effort is normal. No respiratory distress.     Breath sounds: Normal breath sounds. No wheezing, rhonchi or  rales.  Chest:  Breasts:     Right: No axillary adenopathy or supraclavicular adenopathy.     Left: No axillary adenopathy or supraclavicular adenopathy.    Abdominal:     General: Bowel sounds are normal. There is no distension.     Palpations: Abdomen is soft. There is no mass.     Tenderness: There is no abdominal tenderness.  Musculoskeletal:        General: No swelling.     Right lower leg: No edema.     Left lower leg: No edema.  Lymphadenopathy:     Cervical: No cervical adenopathy.     Upper Body:     Right upper body: No supraclavicular or axillary adenopathy.     Left upper body: No supraclavicular or axillary adenopathy.     Lower Body: No right inguinal adenopathy. No left inguinal adenopathy.  Skin:    General: Skin is warm.     Coloration: Skin is not jaundiced.     Findings: Bruising (signifcant facial bruising seen from recent fall) present.  Neurological:     General: No focal deficit present.     Mental Status: He is alert and oriented to person, place, and time. Mental status is at baseline.     Cranial Nerves: Cranial nerves are intact.  Psychiatric:        Mood and Affect: Mood normal.        Behavior: Behavior normal.  Thought Content: Thought content normal.    LABS:    Ref. Range 08/19/2020 00:00  WBC Unknown 7.7  RBC Latest Ref Range: 3.87 - 5.11  3.66 (A)  Hemoglobin Latest Ref Range: 13.5 - 17.5  11.0 (A)  HCT Latest Ref Range: 41 - 53  33 (A)  MCV Latest Ref Range: 80 - 94  91  Platelets Latest Ref Range: 150 - 399  218    ASSESSMENT & PLAN:  Assessment/Plan:  A 70 y.o. male with a severe form of splenectomy-refractory ITP that had been recalcitrant to multiple interventions, but ultimately responded to a combination of Rituxan and Nplate.  I am pleased as his platelets remains fine.  They have been falling over these past few weeks, but they remain normal to where no intervention needs to be reconsidered at this time.  Moving forward,  his CBC will be checked in 3 weeks.  I will see him back in 6 weeks for repeat clinical assessment.  The patient understands that re-initiation of therapy would be considered if his platelets fall below 150.  The patient understands all the plans discussed today and is in agreement with them.     Ashya Nicolaisen Macarthur Critchley, MD

## 2020-08-19 ENCOUNTER — Other Ambulatory Visit: Payer: Self-pay | Admitting: Oncology

## 2020-08-19 ENCOUNTER — Other Ambulatory Visit: Payer: Self-pay

## 2020-08-19 ENCOUNTER — Inpatient Hospital Stay: Payer: Medicare Other | Attending: Oncology | Admitting: Oncology

## 2020-08-19 ENCOUNTER — Other Ambulatory Visit: Payer: Self-pay | Admitting: Hematology and Oncology

## 2020-08-19 ENCOUNTER — Inpatient Hospital Stay: Payer: Medicare Other

## 2020-08-19 ENCOUNTER — Telehealth: Payer: Self-pay | Admitting: Oncology

## 2020-08-19 VITALS — BP 109/65 | HR 68 | Temp 98.2°F | Resp 16 | Ht 68.0 in | Wt 183.9 lb

## 2020-08-19 DIAGNOSIS — D693 Immune thrombocytopenic purpura: Secondary | ICD-10-CM

## 2020-08-19 DIAGNOSIS — D649 Anemia, unspecified: Secondary | ICD-10-CM | POA: Diagnosis not present

## 2020-08-19 LAB — CBC AND DIFFERENTIAL
HCT: 33 — AB (ref 41–53)
Hemoglobin: 11 — AB (ref 13.5–17.5)
Neutrophils Absolute: 5.47
Platelets: 218 (ref 150–399)
WBC: 7.7

## 2020-08-19 LAB — CBC
MCV: 91 (ref 80–94)
RBC: 3.66 — AB (ref 3.87–5.11)

## 2020-08-19 NOTE — Telephone Encounter (Signed)
Per 4/4 los next appt scheduled and given to patient 

## 2020-08-23 ENCOUNTER — Other Ambulatory Visit: Payer: Medicare Other

## 2020-08-23 ENCOUNTER — Ambulatory Visit: Payer: Medicare Other | Admitting: Oncology

## 2020-08-29 ENCOUNTER — Inpatient Hospital Stay: Payer: Medicare Other

## 2020-08-29 ENCOUNTER — Other Ambulatory Visit: Payer: Self-pay | Admitting: Hematology and Oncology

## 2020-08-29 ENCOUNTER — Other Ambulatory Visit: Payer: Self-pay

## 2020-08-29 ENCOUNTER — Telehealth: Payer: Self-pay

## 2020-08-29 DIAGNOSIS — D649 Anemia, unspecified: Secondary | ICD-10-CM | POA: Diagnosis not present

## 2020-08-29 DIAGNOSIS — D693 Immune thrombocytopenic purpura: Secondary | ICD-10-CM | POA: Diagnosis not present

## 2020-08-29 LAB — CBC AND DIFFERENTIAL
HCT: 33 — AB (ref 41–53)
Hemoglobin: 10.7 — AB (ref 13.5–17.5)
Neutrophils Absolute: 4.19
Platelets: 260 (ref 150–399)
WBC: 7.1

## 2020-08-29 LAB — CBC
MCV: 91 (ref 80–94)
RBC: 3.64 — AB (ref 3.87–5.11)

## 2020-08-29 NOTE — Telephone Encounter (Addendum)
Dr Bobby Rumpf notified that pt had coughed up dark blood twice during the night.  When I asked pt to give me an equivalent to how much he coughed up, such as dime size, nickel, or quarter size. He replied, "I don't know. I used 2 paper towels. I didn't realize until this morning that there was blood in the paper towel".  Dr Bobby Rumpf recommends pt come in for CBC.   Pt coming in for CBC @ 1130. Shay notified. (Pt has a standing order for CBC until 2023 per Melissa P,NP, orders)   CBC results are stable. Pt returned home.

## 2020-08-30 DIAGNOSIS — J069 Acute upper respiratory infection, unspecified: Secondary | ICD-10-CM | POA: Diagnosis not present

## 2020-08-30 DIAGNOSIS — M940 Chondrocostal junction syndrome [Tietze]: Secondary | ICD-10-CM | POA: Diagnosis not present

## 2020-08-30 DIAGNOSIS — R051 Acute cough: Secondary | ICD-10-CM | POA: Diagnosis not present

## 2020-09-09 ENCOUNTER — Inpatient Hospital Stay: Payer: Medicare Other

## 2020-09-09 ENCOUNTER — Other Ambulatory Visit: Payer: Self-pay | Admitting: Hematology and Oncology

## 2020-09-09 ENCOUNTER — Other Ambulatory Visit: Payer: Self-pay

## 2020-09-09 DIAGNOSIS — D693 Immune thrombocytopenic purpura: Secondary | ICD-10-CM | POA: Diagnosis not present

## 2020-09-09 DIAGNOSIS — D649 Anemia, unspecified: Secondary | ICD-10-CM | POA: Diagnosis not present

## 2020-09-09 LAB — CBC AND DIFFERENTIAL
HCT: 35 — AB (ref 41–53)
Hemoglobin: 11.2 — AB (ref 13.5–17.5)
Neutrophils Absolute: 3.14
Platelets: 202 (ref 150–399)
WBC: 5.6

## 2020-09-09 LAB — CBC: RBC: 3.87 (ref 3.87–5.11)

## 2020-09-26 NOTE — Progress Notes (Signed)
[facility]  [address] [place],    [address] [phone]  Clinic Day:  09/30/2020  Referring physician: Mateo Flow, MD  This document serves as a record of services personally performed by Dequincy Macarthur Critchley, MD. It was created on their behalf by [facility] E, a trained medical scribe. The creation of this record is based on the scribe's personal observations and the provider's statements to them.  HISTORY OF PRESENT ILLNESS:  The patient is a 70 y.o. male with splenectomy-refractory ITP.  Finally, after receiving 4 weekly doses of Rituxan and rapidly escalating doses of Nplate, this gentleman's platelets rose to more than satisfactory levels.  Due to his extremely high platelets, the patient was held from any form of therapy over these past few weeks.  Over time his platelets have fallen back to normal levels.   He comes in today to reassess his platelets.  Since his last visit, the patient has been doing well.  He denies having bleeding around his tracheostomy site, epistaxis, or other symptoms/findings which concern him for his severe thrombocytopenia having returned.    PHYSICAL EXAM:  Blood pressure 132/76, pulse 65, temperature 98.2 F (36.8 C), resp. rate 16, height 5\' 8"  (1.727 m), weight 184 lb (83.5 kg), SpO2 98 %. Body mass index is 27.98 kg/m. Performance status (ECOG): 1 - Symptomatic but completely ambulatory Physical Exam Constitutional:      Appearance: Normal appearance. He is not ill-appearing.  HENT:     Nose:     Right Nostril: No epistaxis.     Left Nostril: No epistaxis.     Mouth/Throat:     Mouth: Mucous membranes are moist.     Pharynx: Oropharynx is clear. No oropharyngeal exudate or posterior oropharyngeal erythema.  Neck:     Trachea: Tracheostomy present.  Cardiovascular:     Rate and Rhythm: Normal rate and regular rhythm.     Heart sounds: No murmur heard. No friction rub. No gallop.   Pulmonary:      Effort: Pulmonary effort is normal. No respiratory distress.     Breath sounds: Normal breath sounds. No wheezing, rhonchi or rales.  Chest:  Breasts:     Right: No axillary adenopathy or supraclavicular adenopathy.     Left: No axillary adenopathy or supraclavicular adenopathy.    Abdominal:     General: Bowel sounds are normal. There is no distension.     Palpations: Abdomen is soft. There is no mass.     Tenderness: There is no abdominal tenderness.  Musculoskeletal:        General: No swelling.     Right lower leg: No edema.     Left lower leg: No edema.  Lymphadenopathy:     Cervical: No cervical adenopathy.     Upper Body:     Right upper body: No supraclavicular or axillary adenopathy.     Left upper body: No supraclavicular or axillary adenopathy.     Lower Body: No right inguinal adenopathy. No left inguinal adenopathy.  Skin:    General: Skin is warm.     Coloration: Skin is not jaundiced.     Findings: Bruising (signifcant facial bruising seen from recent fall) present.  Neurological:     General: No focal deficit present.     Mental Status: He is alert and oriented to person, place, and time. Mental status is at baseline.     Cranial Nerves: Cranial nerves are intact.  Psychiatric:  Mood and Affect: Mood normal.        Behavior: Behavior normal.        Thought Content: Thought content normal.    LABS:    Ref. Range 09/30/2020 00:00  WBC Unknown 4.5  RBC Latest Ref Range: 3.87 - 5.11  3.82 (A)  Hemoglobin Latest Ref Range: 13.5 - 17.5  10.9 (A)  HCT Latest Ref Range: 41 - 53  33.5 (A)  MCV Latest Ref Range: 80 - 94  88  Platelets Latest Ref Range: 150 - 399  246    ASSESSMENT & PLAN:  Assessment/Plan:  A 70 y.o. male with a severe form of splenectomy-refractory ITP that had been recalcitrant to multiple interventions, but ultimately responded to the combination of Rituxan and Nplate. I am pleased as his platelets remain at ideal levels.  Based upon  this, he will continue to be followed conservatively.  Moving forward, his CBC will be checked monthly.  I will see him back in 3 months for repeat clinical assessment.   The patient understands all the plans discussed today and is in agreement with them.     I, Rita Ohara, am acting as scribe for Marice Potter, MD    I have reviewed this report as typed by the medical scribe, and it is complete and accurate.  Dequincy Macarthur Critchley, MD

## 2020-09-30 ENCOUNTER — Inpatient Hospital Stay: Payer: Medicare Other

## 2020-09-30 ENCOUNTER — Other Ambulatory Visit: Payer: Self-pay

## 2020-09-30 ENCOUNTER — Encounter: Payer: Self-pay | Admitting: Oncology

## 2020-09-30 ENCOUNTER — Telehealth: Payer: Self-pay | Admitting: Oncology

## 2020-09-30 ENCOUNTER — Inpatient Hospital Stay: Payer: Medicare Other | Attending: Oncology | Admitting: Oncology

## 2020-09-30 ENCOUNTER — Other Ambulatory Visit: Payer: Self-pay | Admitting: Oncology

## 2020-09-30 VITALS — BP 132/76 | HR 65 | Temp 98.2°F | Resp 16 | Ht 68.0 in | Wt 184.0 lb

## 2020-09-30 DIAGNOSIS — D693 Immune thrombocytopenic purpura: Secondary | ICD-10-CM | POA: Diagnosis not present

## 2020-09-30 DIAGNOSIS — D649 Anemia, unspecified: Secondary | ICD-10-CM | POA: Diagnosis not present

## 2020-09-30 LAB — CBC: RBC: 3.82 — AB (ref 3.87–5.11)

## 2020-09-30 LAB — CBC AND DIFFERENTIAL
HCT: 34 — AB (ref 41–53)
Hemoglobin: 10.9 — AB (ref 13.5–17.5)
Neutrophils Absolute: 2.16
Platelets: 246 (ref 150–399)
WBC: 4.5

## 2020-09-30 NOTE — Telephone Encounter (Signed)
Per 5/16 los next appts scheduled and confirmed with patient

## 2020-10-30 DIAGNOSIS — Z931 Gastrostomy status: Secondary | ICD-10-CM | POA: Diagnosis not present

## 2020-10-30 DIAGNOSIS — Y842 Radiological procedure and radiotherapy as the cause of abnormal reaction of the patient, or of later complication, without mention of misadventure at the time of the procedure: Secondary | ICD-10-CM | POA: Diagnosis not present

## 2020-10-30 DIAGNOSIS — I890 Lymphedema, not elsewhere classified: Secondary | ICD-10-CM | POA: Diagnosis not present

## 2020-10-30 DIAGNOSIS — Z8589 Personal history of malignant neoplasm of other organs and systems: Secondary | ICD-10-CM | POA: Diagnosis not present

## 2020-10-30 DIAGNOSIS — R1310 Dysphagia, unspecified: Secondary | ICD-10-CM | POA: Diagnosis not present

## 2020-10-30 DIAGNOSIS — Z930 Tracheostomy status: Secondary | ICD-10-CM | POA: Diagnosis not present

## 2020-10-30 DIAGNOSIS — M272 Inflammatory conditions of jaws: Secondary | ICD-10-CM | POA: Diagnosis not present

## 2020-10-31 ENCOUNTER — Encounter: Payer: Self-pay | Admitting: Hematology and Oncology

## 2020-10-31 ENCOUNTER — Other Ambulatory Visit: Payer: Self-pay

## 2020-10-31 ENCOUNTER — Inpatient Hospital Stay: Payer: Medicare Other | Attending: Oncology

## 2020-10-31 DIAGNOSIS — D649 Anemia, unspecified: Secondary | ICD-10-CM | POA: Diagnosis not present

## 2020-10-31 DIAGNOSIS — D693 Immune thrombocytopenic purpura: Secondary | ICD-10-CM

## 2020-10-31 LAB — CBC AND DIFFERENTIAL
HCT: 35 — AB (ref 41–53)
Hemoglobin: 11.5 — AB (ref 13.5–17.5)
Neutrophils Absolute: 3.25
Platelets: 231 (ref 150–399)
WBC: 5.7

## 2020-10-31 LAB — CBC: RBC: 4.15 (ref 3.87–5.11)

## 2020-12-02 ENCOUNTER — Other Ambulatory Visit: Payer: Self-pay | Admitting: Hematology and Oncology

## 2020-12-02 ENCOUNTER — Inpatient Hospital Stay: Payer: Medicare Other | Attending: Oncology

## 2020-12-02 ENCOUNTER — Encounter: Payer: Self-pay | Admitting: Hematology and Oncology

## 2020-12-02 DIAGNOSIS — C090 Malignant neoplasm of tonsillar fossa: Secondary | ICD-10-CM | POA: Diagnosis not present

## 2020-12-02 DIAGNOSIS — D693 Immune thrombocytopenic purpura: Secondary | ICD-10-CM

## 2020-12-02 LAB — CBC
MCV: 86 (ref 80–94)
RBC: 4.25 (ref 3.87–5.11)

## 2020-12-02 LAB — CBC AND DIFFERENTIAL
HCT: 37 — AB (ref 41–53)
Hemoglobin: 11.9 — AB (ref 13.5–17.5)
Neutrophils Absolute: 4.54
Platelets: 218 (ref 150–399)
WBC: 7.2

## 2020-12-04 ENCOUNTER — Telehealth: Payer: Self-pay

## 2020-12-04 NOTE — Telephone Encounter (Signed)
Pt notified that Dr Bobby Rumpf states, "that his platelet count of 218 was excellent". Pt verbalized understanding.

## 2020-12-16 DIAGNOSIS — Z20822 Contact with and (suspected) exposure to covid-19: Secondary | ICD-10-CM | POA: Diagnosis not present

## 2020-12-19 NOTE — Progress Notes (Signed)
[facility]  [address] [place],  [place]  [address] [phone]  Clinic Day:  12/31/2020  Referring physician: Mateo Flow, MD  This document serves as a record of services personally performed by Harrold Fitchett Macarthur Critchley, MD. It was created on their behalf by [facility] E, a trained medical scribe. The creation of this record is based on the scribe's personal observations and the provider's statements to them.  HISTORY OF PRESENT ILLNESS:  The patient is a 70 y.o. male with splenectomy-refractory ITP.  Finally, after receiving 4 weekly doses of Rituxan and rapidly escalating doses of Nplate, this gentleman's platelets rose to more than satisfactory levels.  Due to his extremely high platelets, the patient was held from any form of therapy over these past few weeks.  Over time his platelets have fallen back to normal levels.   He comes in today to reassess his platelets.  Since his last visit, the patient has been doing well.  He denies having bleeding around his tracheostomy site, epistaxis, or other symptoms/findings which concern him for his severe thrombocytopenia having returned.    PHYSICAL EXAM:  There were no vitals taken for this visit. There is no height or weight on file to calculate BMI. Performance status (ECOG): 1 - Symptomatic but completely ambulatory Physical Exam Constitutional:      General: He is not in acute distress.    Appearance: Normal appearance. He is normal weight.  HENT:     Head: Normocephalic and atraumatic.  Eyes:     General: No scleral icterus.    Extraocular Movements: Extraocular movements intact.     Conjunctiva/sclera: Conjunctivae normal.     Pupils: Pupils are equal, round, and reactive to light.  Cardiovascular:     Rate and Rhythm: Normal rate and regular rhythm.     Pulses: Normal pulses.     Heart sounds: Normal heart sounds. No murmur heard.   No friction rub. No gallop.  Pulmonary:     Effort:  Pulmonary effort is normal. No respiratory distress.     Breath sounds: Normal breath sounds.  Abdominal:     General: Bowel sounds are normal. There is no distension.     Palpations: Abdomen is soft. There is no hepatomegaly, splenomegaly or mass.     Tenderness: There is no abdominal tenderness.  Musculoskeletal:        General: Normal range of motion.     Cervical back: Normal range of motion and neck supple.     Right lower leg: No edema.     Left lower leg: No edema.  Lymphadenopathy:     Cervical: No cervical adenopathy.  Skin:    General: Skin is warm and dry.  Neurological:     General: No focal deficit present.     Mental Status: He is alert and oriented to person, place, and time. Mental status is at baseline.  Psychiatric:        Mood and Affect: Mood normal.        Behavior: Behavior normal.        Thought Content: Thought content normal.        Judgment: Judgment normal.   LABS:     ASSESSMENT & PLAN:  Assessment/Plan:  A 70 y.o. male with a severe form of splenectomy-refractory ITP that had been recalcitrant to multiple interventions, but ultimately responded to the combination of Rituxan and Nplate. I am pleased as his platelets remain at ideal levels.  His other  peripheral counts have improved as well.  Based upon this, he will continue to be followed conservatively.  Moving forward, his CBC will be checked every 2 months.  I will see him back in 4 months for repeat clinical assessment.   The patient understands all the plans discussed today and is in agreement with them.     I, Rita Ohara, am acting as scribe for Marice Potter, MD    I have reviewed this report as typed by the medical scribe, and it is complete and accurate.  Olar Santini Macarthur Critchley, MD

## 2020-12-31 ENCOUNTER — Inpatient Hospital Stay: Payer: Medicare Other | Attending: Oncology | Admitting: Oncology

## 2020-12-31 ENCOUNTER — Other Ambulatory Visit: Payer: Self-pay | Admitting: Oncology

## 2020-12-31 ENCOUNTER — Telehealth: Payer: Self-pay | Admitting: Oncology

## 2020-12-31 ENCOUNTER — Inpatient Hospital Stay: Payer: Medicare Other

## 2020-12-31 ENCOUNTER — Encounter: Payer: Self-pay | Admitting: Oncology

## 2020-12-31 VITALS — BP 82/48 | HR 63 | Temp 97.9°F | Resp 16 | Ht 68.0 in | Wt 181.0 lb

## 2020-12-31 DIAGNOSIS — Z79899 Other long term (current) drug therapy: Secondary | ICD-10-CM | POA: Diagnosis not present

## 2020-12-31 DIAGNOSIS — D693 Immune thrombocytopenic purpura: Secondary | ICD-10-CM

## 2020-12-31 DIAGNOSIS — Z0000 Encounter for general adult medical examination without abnormal findings: Secondary | ICD-10-CM | POA: Diagnosis not present

## 2020-12-31 DIAGNOSIS — E039 Hypothyroidism, unspecified: Secondary | ICD-10-CM | POA: Diagnosis not present

## 2020-12-31 DIAGNOSIS — G4733 Obstructive sleep apnea (adult) (pediatric): Secondary | ICD-10-CM | POA: Diagnosis not present

## 2020-12-31 DIAGNOSIS — D649 Anemia, unspecified: Secondary | ICD-10-CM | POA: Diagnosis not present

## 2020-12-31 DIAGNOSIS — Z9989 Dependence on other enabling machines and devices: Secondary | ICD-10-CM | POA: Diagnosis not present

## 2020-12-31 LAB — CBC AND DIFFERENTIAL
HCT: 38 — AB (ref 41–53)
Hemoglobin: 12.4 — AB (ref 13.5–17.5)
Neutrophils Absolute: 2.81
Platelets: 220 (ref 150–399)
WBC: 5.1

## 2020-12-31 LAB — CBC: RBC: 4.33 (ref 3.87–5.11)

## 2020-12-31 NOTE — Telephone Encounter (Signed)
Per 8/16 LOS, patient scheduled for Dec Appt's.  Gave patient Appt Summary

## 2021-01-16 DIAGNOSIS — Z20822 Contact with and (suspected) exposure to covid-19: Secondary | ICD-10-CM | POA: Diagnosis not present

## 2021-02-04 DIAGNOSIS — I4891 Unspecified atrial fibrillation: Secondary | ICD-10-CM | POA: Diagnosis not present

## 2021-02-04 DIAGNOSIS — I10 Essential (primary) hypertension: Secondary | ICD-10-CM | POA: Diagnosis not present

## 2021-02-04 DIAGNOSIS — R042 Hemoptysis: Secondary | ICD-10-CM | POA: Diagnosis not present

## 2021-02-04 DIAGNOSIS — I700 Atherosclerosis of aorta: Secondary | ICD-10-CM | POA: Diagnosis not present

## 2021-02-05 DIAGNOSIS — M272 Inflammatory conditions of jaws: Secondary | ICD-10-CM | POA: Diagnosis not present

## 2021-02-05 DIAGNOSIS — Z930 Tracheostomy status: Secondary | ICD-10-CM | POA: Diagnosis not present

## 2021-02-05 DIAGNOSIS — J392 Other diseases of pharynx: Secondary | ICD-10-CM | POA: Diagnosis not present

## 2021-02-05 DIAGNOSIS — Z923 Personal history of irradiation: Secondary | ICD-10-CM | POA: Diagnosis not present

## 2021-02-05 DIAGNOSIS — Z85818 Personal history of malignant neoplasm of other sites of lip, oral cavity, and pharynx: Secondary | ICD-10-CM | POA: Diagnosis not present

## 2021-02-05 DIAGNOSIS — Z8719 Personal history of other diseases of the digestive system: Secondary | ICD-10-CM | POA: Diagnosis not present

## 2021-02-05 DIAGNOSIS — Z87891 Personal history of nicotine dependence: Secondary | ICD-10-CM | POA: Diagnosis not present

## 2021-02-05 DIAGNOSIS — R1310 Dysphagia, unspecified: Secondary | ICD-10-CM | POA: Diagnosis not present

## 2021-02-05 DIAGNOSIS — J988 Other specified respiratory disorders: Secondary | ICD-10-CM | POA: Diagnosis not present

## 2021-02-05 DIAGNOSIS — C099 Malignant neoplasm of tonsil, unspecified: Secondary | ICD-10-CM | POA: Diagnosis not present

## 2021-02-05 DIAGNOSIS — I890 Lymphedema, not elsewhere classified: Secondary | ICD-10-CM | POA: Diagnosis not present

## 2021-02-05 DIAGNOSIS — I780 Hereditary hemorrhagic telangiectasia: Secondary | ICD-10-CM | POA: Diagnosis not present

## 2021-02-05 DIAGNOSIS — Z931 Gastrostomy status: Secondary | ICD-10-CM | POA: Diagnosis not present

## 2021-02-05 DIAGNOSIS — Z9889 Other specified postprocedural states: Secondary | ICD-10-CM | POA: Diagnosis not present

## 2021-02-19 DIAGNOSIS — Z23 Encounter for immunization: Secondary | ICD-10-CM | POA: Diagnosis not present

## 2021-02-19 DIAGNOSIS — C099 Malignant neoplasm of tonsil, unspecified: Secondary | ICD-10-CM | POA: Diagnosis not present

## 2021-02-19 DIAGNOSIS — Z9049 Acquired absence of other specified parts of digestive tract: Secondary | ICD-10-CM | POA: Diagnosis not present

## 2021-02-19 DIAGNOSIS — R600 Localized edema: Secondary | ICD-10-CM | POA: Diagnosis not present

## 2021-02-19 DIAGNOSIS — M272 Inflammatory conditions of jaws: Secondary | ICD-10-CM | POA: Diagnosis not present

## 2021-02-19 DIAGNOSIS — Z930 Tracheostomy status: Secondary | ICD-10-CM | POA: Diagnosis not present

## 2021-02-19 DIAGNOSIS — R1310 Dysphagia, unspecified: Secondary | ICD-10-CM | POA: Diagnosis not present

## 2021-02-19 DIAGNOSIS — Z87891 Personal history of nicotine dependence: Secondary | ICD-10-CM | POA: Diagnosis not present

## 2021-02-19 DIAGNOSIS — R58 Hemorrhage, not elsewhere classified: Secondary | ICD-10-CM | POA: Diagnosis not present

## 2021-02-19 DIAGNOSIS — J384 Edema of larynx: Secondary | ICD-10-CM | POA: Diagnosis not present

## 2021-02-28 DIAGNOSIS — C099 Malignant neoplasm of tonsil, unspecified: Secondary | ICD-10-CM | POA: Diagnosis not present

## 2021-03-03 ENCOUNTER — Other Ambulatory Visit: Payer: Medicare Other

## 2021-03-04 ENCOUNTER — Inpatient Hospital Stay: Payer: Medicare Other | Attending: Oncology

## 2021-03-04 ENCOUNTER — Other Ambulatory Visit: Payer: Self-pay | Admitting: Hematology and Oncology

## 2021-03-04 DIAGNOSIS — D693 Immune thrombocytopenic purpura: Secondary | ICD-10-CM

## 2021-03-04 LAB — CBC AND DIFFERENTIAL
HCT: 39 — AB (ref 41–53)
Hemoglobin: 12.7 — AB (ref 13.5–17.5)
Neutrophils Absolute: 2.7
Platelets: 238 (ref 150–399)
WBC: 5

## 2021-03-04 LAB — CBC
MCV: 88 (ref 80–94)
RBC: 4.45 (ref 3.87–5.11)

## 2021-03-07 DIAGNOSIS — C099 Malignant neoplasm of tonsil, unspecified: Secondary | ICD-10-CM | POA: Diagnosis not present

## 2021-03-10 DIAGNOSIS — J392 Other diseases of pharynx: Secondary | ICD-10-CM

## 2021-03-10 HISTORY — DX: Other diseases of pharynx: J39.2

## 2021-03-14 DIAGNOSIS — C099 Malignant neoplasm of tonsil, unspecified: Secondary | ICD-10-CM | POA: Diagnosis not present

## 2021-03-14 DIAGNOSIS — Z01812 Encounter for preprocedural laboratory examination: Secondary | ICD-10-CM | POA: Diagnosis not present

## 2021-03-19 DIAGNOSIS — E785 Hyperlipidemia, unspecified: Secondary | ICD-10-CM | POA: Diagnosis not present

## 2021-03-19 DIAGNOSIS — E039 Hypothyroidism, unspecified: Secondary | ICD-10-CM | POA: Diagnosis not present

## 2021-03-19 DIAGNOSIS — Z9081 Acquired absence of spleen: Secondary | ICD-10-CM | POA: Diagnosis not present

## 2021-03-19 DIAGNOSIS — I890 Lymphedema, not elsewhere classified: Secondary | ICD-10-CM | POA: Diagnosis not present

## 2021-03-19 DIAGNOSIS — J988 Other specified respiratory disorders: Secondary | ICD-10-CM | POA: Diagnosis not present

## 2021-03-19 DIAGNOSIS — I272 Pulmonary hypertension, unspecified: Secondary | ICD-10-CM | POA: Diagnosis not present

## 2021-03-19 DIAGNOSIS — J392 Other diseases of pharynx: Secondary | ICD-10-CM | POA: Diagnosis not present

## 2021-03-19 DIAGNOSIS — Z930 Tracheostomy status: Secondary | ICD-10-CM | POA: Diagnosis not present

## 2021-03-19 DIAGNOSIS — Z923 Personal history of irradiation: Secondary | ICD-10-CM | POA: Diagnosis not present

## 2021-03-19 DIAGNOSIS — I420 Dilated cardiomyopathy: Secondary | ICD-10-CM | POA: Diagnosis not present

## 2021-03-19 DIAGNOSIS — Z931 Gastrostomy status: Secondary | ICD-10-CM | POA: Diagnosis not present

## 2021-03-19 DIAGNOSIS — Z86718 Personal history of other venous thrombosis and embolism: Secondary | ICD-10-CM | POA: Diagnosis not present

## 2021-03-19 DIAGNOSIS — F32A Depression, unspecified: Secondary | ICD-10-CM | POA: Diagnosis not present

## 2021-03-19 DIAGNOSIS — I10 Essential (primary) hypertension: Secondary | ICD-10-CM | POA: Diagnosis not present

## 2021-03-19 DIAGNOSIS — G4733 Obstructive sleep apnea (adult) (pediatric): Secondary | ICD-10-CM | POA: Diagnosis not present

## 2021-03-19 DIAGNOSIS — D649 Anemia, unspecified: Secondary | ICD-10-CM | POA: Diagnosis not present

## 2021-03-19 DIAGNOSIS — Z7982 Long term (current) use of aspirin: Secondary | ICD-10-CM | POA: Diagnosis not present

## 2021-03-19 DIAGNOSIS — R58 Hemorrhage, not elsewhere classified: Secondary | ICD-10-CM | POA: Diagnosis not present

## 2021-03-19 DIAGNOSIS — Z85818 Personal history of malignant neoplasm of other sites of lip, oral cavity, and pharynx: Secondary | ICD-10-CM | POA: Diagnosis not present

## 2021-03-19 DIAGNOSIS — L598 Other specified disorders of the skin and subcutaneous tissue related to radiation: Secondary | ICD-10-CM | POA: Diagnosis not present

## 2021-03-19 DIAGNOSIS — Z87891 Personal history of nicotine dependence: Secondary | ICD-10-CM | POA: Diagnosis not present

## 2021-03-19 DIAGNOSIS — D693 Immune thrombocytopenic purpura: Secondary | ICD-10-CM | POA: Diagnosis not present

## 2021-03-19 DIAGNOSIS — F419 Anxiety disorder, unspecified: Secondary | ICD-10-CM | POA: Diagnosis not present

## 2021-03-19 DIAGNOSIS — M272 Inflammatory conditions of jaws: Secondary | ICD-10-CM | POA: Diagnosis not present

## 2021-03-19 DIAGNOSIS — Z79899 Other long term (current) drug therapy: Secondary | ICD-10-CM | POA: Diagnosis not present

## 2021-04-25 NOTE — Progress Notes (Signed)
[facility]  [address] [place],  [place]  [address] [phone]  Clinic Day:  05/02/2021  Referring physician: Mateo Flow, MD  This document serves as a record of services personally performed by Dequincy Macarthur Critchley, MD. It was created on their behalf by [facility] E, a trained medical scribe. The creation of this record is based on the scribe's personal observations and the provider's statements to them.  HISTORY OF PRESENT ILLNESS:  The patient is a 70 y.o. male with splenectomy-refractory ITP.  Finally, after receiving 4 weekly doses of Rituxan and rapidly escalating doses of Nplate, this gentleman's platelets rose to more than satisfactory levels.  Due to his extremely high platelet levels yet, the patient has been held from any form of therapy over these past months.  Over time, his platelets have fallen back to normal levels.   He comes in today to reassess his platelets.  Since his last visit, the patient has been doing well.  He had 1 episode of hemoptysis, which was due to a nodule in his trachea.  Apparently, this lesion was recently biopsied, with pathology coming back negative for a malignancy.  He denies having any subcutaneous bleeding/bruising issues which concern him for severe thrombocytopenia having returned.   PHYSICAL EXAM:  Blood pressure 120/79, pulse 63, temperature 98 F (36.7 C), resp. rate 16, height 5\' 8"  (1.727 m), weight 187 lb 12.8 oz (85.2 kg), SpO2 98 %. Body mass index is 28.55 kg/m. Performance status (ECOG): 1 - Symptomatic but completely ambulatory Physical Exam Constitutional:      General: He is not in acute distress.    Appearance: Normal appearance. He is normal weight.  HENT:     Head: Normocephalic and atraumatic.  Eyes:     General: No scleral icterus.    Extraocular Movements: Extraocular movements intact.     Conjunctiva/sclera: Conjunctivae normal.     Pupils: Pupils are equal, round, and reactive  to light.  Cardiovascular:     Rate and Rhythm: Normal rate and regular rhythm.     Pulses: Normal pulses.     Heart sounds: Normal heart sounds. No murmur heard.   No friction rub. No gallop.  Pulmonary:     Effort: Pulmonary effort is normal. No respiratory distress.     Breath sounds: Normal breath sounds.  Abdominal:     General: Bowel sounds are normal. There is no distension.     Palpations: Abdomen is soft. There is no hepatomegaly, splenomegaly or mass.     Tenderness: There is no abdominal tenderness.  Musculoskeletal:        General: Normal range of motion.     Cervical back: Normal range of motion and neck supple.     Right lower leg: No edema.     Left lower leg: No edema.  Lymphadenopathy:     Cervical: No cervical adenopathy.  Skin:    General: Skin is warm and dry.  Neurological:     General: No focal deficit present.     Mental Status: He is alert and oriented to person, place, and time. Mental status is at baseline.  Psychiatric:        Mood and Affect: Mood normal.        Behavior: Behavior normal.        Thought Content: Thought content normal.        Judgment: Judgment normal.   LABS:    Latest Reference Range & Units 05/02/21 00:00  WBC  5.3 (E)  RBC 3.87 - 5.11  4.4 (E)  Hemoglobin 13.5 - 17.5  12.8 ! (E)  HCT 41 - 53  40 ! (E)  Platelets 150 - 399  256 (E)  !: Data is abnormal (E): External lab result   ASSESSMENT & PLAN:  Assessment/Plan:  A 70 y.o. male with a severe form of splenectomy-refractory ITP which had been recalcitrant to multiple interventions, but ultimately responded to the combination of Rituxan and Nplate. I am pleased as his platelets remain at ideal levels.  His other peripheral counts have improved as well.  Based upon this, he will continue to be followed conservatively.  Moving forward, his CBC will be checked every 3 months.  I will see him back in 6 months for repeat clinical assessment.   The patient understands all the plans  discussed today and is in agreement with them.     I, Rita Ohara, am acting as scribe for Marice Potter, MD    I have reviewed this report as typed by the medical scribe, and it is complete and accurate.  Dequincy Macarthur Critchley, MD

## 2021-05-01 DIAGNOSIS — Z930 Tracheostomy status: Secondary | ICD-10-CM | POA: Diagnosis not present

## 2021-05-01 DIAGNOSIS — R1310 Dysphagia, unspecified: Secondary | ICD-10-CM | POA: Diagnosis not present

## 2021-05-01 DIAGNOSIS — J392 Other diseases of pharynx: Secondary | ICD-10-CM | POA: Diagnosis not present

## 2021-05-01 DIAGNOSIS — Z931 Gastrostomy status: Secondary | ICD-10-CM | POA: Diagnosis not present

## 2021-05-01 DIAGNOSIS — M272 Inflammatory conditions of jaws: Secondary | ICD-10-CM | POA: Diagnosis not present

## 2021-05-01 DIAGNOSIS — I890 Lymphedema, not elsewhere classified: Secondary | ICD-10-CM | POA: Diagnosis not present

## 2021-05-01 DIAGNOSIS — Z9889 Other specified postprocedural states: Secondary | ICD-10-CM | POA: Diagnosis not present

## 2021-05-01 DIAGNOSIS — Z79899 Other long term (current) drug therapy: Secondary | ICD-10-CM | POA: Diagnosis not present

## 2021-05-01 DIAGNOSIS — Z923 Personal history of irradiation: Secondary | ICD-10-CM | POA: Diagnosis not present

## 2021-05-01 DIAGNOSIS — C099 Malignant neoplasm of tonsil, unspecified: Secondary | ICD-10-CM | POA: Diagnosis not present

## 2021-05-01 DIAGNOSIS — Z8589 Personal history of malignant neoplasm of other organs and systems: Secondary | ICD-10-CM | POA: Diagnosis not present

## 2021-05-01 DIAGNOSIS — Z87891 Personal history of nicotine dependence: Secondary | ICD-10-CM | POA: Diagnosis not present

## 2021-05-02 ENCOUNTER — Other Ambulatory Visit: Payer: Self-pay | Admitting: Oncology

## 2021-05-02 ENCOUNTER — Inpatient Hospital Stay (INDEPENDENT_AMBULATORY_CARE_PROVIDER_SITE_OTHER): Payer: Medicare Other | Admitting: Oncology

## 2021-05-02 ENCOUNTER — Other Ambulatory Visit: Payer: Self-pay | Admitting: Hematology and Oncology

## 2021-05-02 ENCOUNTER — Inpatient Hospital Stay: Payer: Medicare Other | Attending: Oncology

## 2021-05-02 VITALS — BP 120/79 | HR 63 | Temp 98.0°F | Resp 16 | Ht 68.0 in | Wt 187.8 lb

## 2021-05-02 DIAGNOSIS — D693 Immune thrombocytopenic purpura: Secondary | ICD-10-CM

## 2021-05-02 LAB — CBC AND DIFFERENTIAL
HCT: 40 — AB (ref 41–53)
Hemoglobin: 12.8 — AB (ref 13.5–17.5)
Neutrophils Absolute: 3.29
Platelets: 256 (ref 150–399)
WBC: 5.3

## 2021-05-02 LAB — CBC: RBC: 4.4 (ref 3.87–5.11)

## 2021-05-03 DIAGNOSIS — Z20828 Contact with and (suspected) exposure to other viral communicable diseases: Secondary | ICD-10-CM | POA: Diagnosis not present

## 2021-07-28 DIAGNOSIS — G4733 Obstructive sleep apnea (adult) (pediatric): Secondary | ICD-10-CM | POA: Diagnosis not present

## 2021-07-28 DIAGNOSIS — Z9989 Dependence on other enabling machines and devices: Secondary | ICD-10-CM | POA: Diagnosis not present

## 2021-07-28 DIAGNOSIS — E039 Hypothyroidism, unspecified: Secondary | ICD-10-CM | POA: Diagnosis not present

## 2021-07-28 DIAGNOSIS — E7800 Pure hypercholesterolemia, unspecified: Secondary | ICD-10-CM | POA: Diagnosis not present

## 2021-07-31 ENCOUNTER — Other Ambulatory Visit: Payer: Self-pay

## 2021-07-31 ENCOUNTER — Inpatient Hospital Stay: Payer: Medicare Other | Attending: Oncology

## 2021-07-31 DIAGNOSIS — D693 Immune thrombocytopenic purpura: Secondary | ICD-10-CM | POA: Diagnosis not present

## 2021-07-31 LAB — CBC AND DIFFERENTIAL
HCT: 42 (ref 41–53)
Hemoglobin: 13.5 (ref 13.5–17.5)
Neutrophils Absolute: 3.84
Platelets: 239 10*3/uL (ref 150–400)
WBC: 6.3

## 2021-07-31 LAB — CBC: RBC: 4.48 (ref 3.87–5.11)

## 2021-09-08 DIAGNOSIS — Z20822 Contact with and (suspected) exposure to covid-19: Secondary | ICD-10-CM | POA: Diagnosis not present

## 2021-09-18 DIAGNOSIS — I5022 Chronic systolic (congestive) heart failure: Secondary | ICD-10-CM | POA: Diagnosis not present

## 2021-09-18 DIAGNOSIS — I110 Hypertensive heart disease with heart failure: Secondary | ICD-10-CM | POA: Diagnosis not present

## 2021-09-18 DIAGNOSIS — Z87891 Personal history of nicotine dependence: Secondary | ICD-10-CM | POA: Diagnosis not present

## 2021-09-18 DIAGNOSIS — I420 Dilated cardiomyopathy: Secondary | ICD-10-CM | POA: Diagnosis not present

## 2021-09-28 DIAGNOSIS — J384 Edema of larynx: Secondary | ICD-10-CM | POA: Diagnosis not present

## 2021-09-28 DIAGNOSIS — J9501 Hemorrhage from tracheostomy stoma: Secondary | ICD-10-CM | POA: Diagnosis not present

## 2021-09-28 DIAGNOSIS — M47812 Spondylosis without myelopathy or radiculopathy, cervical region: Secondary | ICD-10-CM | POA: Diagnosis not present

## 2021-09-28 DIAGNOSIS — Z20822 Contact with and (suspected) exposure to covid-19: Secondary | ICD-10-CM | POA: Diagnosis not present

## 2021-09-28 DIAGNOSIS — I10 Essential (primary) hypertension: Secondary | ICD-10-CM | POA: Diagnosis not present

## 2021-09-28 DIAGNOSIS — I160 Hypertensive urgency: Secondary | ICD-10-CM | POA: Diagnosis not present

## 2021-09-28 DIAGNOSIS — I672 Cerebral atherosclerosis: Secondary | ICD-10-CM | POA: Diagnosis not present

## 2021-09-29 DIAGNOSIS — I781 Nevus, non-neoplastic: Secondary | ICD-10-CM | POA: Diagnosis not present

## 2021-09-29 DIAGNOSIS — J9501 Hemorrhage from tracheostomy stoma: Secondary | ICD-10-CM | POA: Diagnosis not present

## 2021-09-29 DIAGNOSIS — C099 Malignant neoplasm of tonsil, unspecified: Secondary | ICD-10-CM | POA: Diagnosis not present

## 2021-09-29 DIAGNOSIS — I10 Essential (primary) hypertension: Secondary | ICD-10-CM | POA: Diagnosis not present

## 2021-09-29 DIAGNOSIS — R000 Tachycardia, unspecified: Secondary | ICD-10-CM | POA: Diagnosis not present

## 2021-09-29 DIAGNOSIS — Z20822 Contact with and (suspected) exposure to covid-19: Secondary | ICD-10-CM | POA: Diagnosis not present

## 2021-09-29 DIAGNOSIS — I160 Hypertensive urgency: Secondary | ICD-10-CM | POA: Diagnosis not present

## 2021-09-29 DIAGNOSIS — R059 Cough, unspecified: Secondary | ICD-10-CM | POA: Diagnosis not present

## 2021-09-30 DIAGNOSIS — N179 Acute kidney failure, unspecified: Secondary | ICD-10-CM | POA: Diagnosis not present

## 2021-09-30 DIAGNOSIS — I160 Hypertensive urgency: Secondary | ICD-10-CM | POA: Diagnosis not present

## 2021-10-01 DIAGNOSIS — R093 Abnormal sputum: Secondary | ICD-10-CM | POA: Diagnosis not present

## 2021-10-01 DIAGNOSIS — R509 Fever, unspecified: Secondary | ICD-10-CM | POA: Diagnosis not present

## 2021-10-01 DIAGNOSIS — R042 Hemoptysis: Secondary | ICD-10-CM | POA: Diagnosis not present

## 2021-10-01 DIAGNOSIS — I10 Essential (primary) hypertension: Secondary | ICD-10-CM | POA: Diagnosis not present

## 2021-10-01 DIAGNOSIS — I160 Hypertensive urgency: Secondary | ICD-10-CM | POA: Diagnosis not present

## 2021-10-01 DIAGNOSIS — C099 Malignant neoplasm of tonsil, unspecified: Secondary | ICD-10-CM | POA: Diagnosis not present

## 2021-10-01 DIAGNOSIS — Z20822 Contact with and (suspected) exposure to covid-19: Secondary | ICD-10-CM | POA: Diagnosis not present

## 2021-10-01 DIAGNOSIS — E871 Hypo-osmolality and hyponatremia: Secondary | ICD-10-CM | POA: Diagnosis not present

## 2021-10-01 DIAGNOSIS — N179 Acute kidney failure, unspecified: Secondary | ICD-10-CM | POA: Diagnosis not present

## 2021-10-14 DIAGNOSIS — Z6830 Body mass index (BMI) 30.0-30.9, adult: Secondary | ICD-10-CM | POA: Diagnosis not present

## 2021-10-14 DIAGNOSIS — I10 Essential (primary) hypertension: Secondary | ICD-10-CM | POA: Diagnosis not present

## 2021-10-14 DIAGNOSIS — Z930 Tracheostomy status: Secondary | ICD-10-CM | POA: Diagnosis not present

## 2021-10-28 DIAGNOSIS — J392 Other diseases of pharynx: Secondary | ICD-10-CM | POA: Diagnosis not present

## 2021-10-28 DIAGNOSIS — I890 Lymphedema, not elsewhere classified: Secondary | ICD-10-CM | POA: Diagnosis not present

## 2021-10-28 DIAGNOSIS — R1310 Dysphagia, unspecified: Secondary | ICD-10-CM | POA: Diagnosis not present

## 2021-10-28 DIAGNOSIS — Z79899 Other long term (current) drug therapy: Secondary | ICD-10-CM | POA: Diagnosis not present

## 2021-10-28 DIAGNOSIS — Z87891 Personal history of nicotine dependence: Secondary | ICD-10-CM | POA: Diagnosis not present

## 2021-10-28 DIAGNOSIS — Z931 Gastrostomy status: Secondary | ICD-10-CM | POA: Diagnosis not present

## 2021-10-28 DIAGNOSIS — C099 Malignant neoplasm of tonsil, unspecified: Secondary | ICD-10-CM | POA: Diagnosis not present

## 2021-10-28 DIAGNOSIS — Z923 Personal history of irradiation: Secondary | ICD-10-CM | POA: Diagnosis not present

## 2021-10-28 DIAGNOSIS — Z8589 Personal history of malignant neoplasm of other organs and systems: Secondary | ICD-10-CM | POA: Diagnosis not present

## 2021-10-28 DIAGNOSIS — M272 Inflammatory conditions of jaws: Secondary | ICD-10-CM | POA: Diagnosis not present

## 2021-10-28 DIAGNOSIS — Z930 Tracheostomy status: Secondary | ICD-10-CM | POA: Diagnosis not present

## 2021-10-28 DIAGNOSIS — Z9889 Other specified postprocedural states: Secondary | ICD-10-CM | POA: Diagnosis not present

## 2021-10-29 DIAGNOSIS — C099 Malignant neoplasm of tonsil, unspecified: Secondary | ICD-10-CM | POA: Diagnosis not present

## 2021-10-31 ENCOUNTER — Other Ambulatory Visit: Payer: Medicare Other

## 2021-10-31 ENCOUNTER — Ambulatory Visit: Payer: Medicare Other | Admitting: Oncology

## 2021-11-02 NOTE — Progress Notes (Signed)
[facility]  [address] [place],  [place]  [address] [phone]  Clinic Day:  10/31/2020  Referring physician: Mateo Flow, MD  HISTORY OF PRESENT ILLNESS:  The patient is a 71 y.o. male with splenectomy-refractory ITP.  Finally, after receiving 4 weekly doses of Rituxan and rapidly escalating doses of Nplate, this gentleman's platelets rose to more than satisfactory levels.  Due to his extremely high platelet levels which came from these interventions, the patient has been held off any form of therapy since February 2017.  He comes in today to reassess his platelets.  Since his last visit, the patient has been doing well.  He had 1 episode of bleeding at his tracheostomy site in May 2023.  However, at the time this occurred, his platelet count was over 300.  He denies having other bleeding/bruising issues which concern him for having recurrent ITP.  Marland Kitchen   PHYSICAL EXAM:  Blood pressure 131/82, pulse 64, temperature 98.4 F (36.9 C), resp. rate 16, height '5\' 8"'$  (1.727 m), weight 192 lb 14.4 oz (87.5 kg), SpO2 98 %. Body mass index is 29.33 kg/m. Performance status (ECOG): 1 - Symptomatic but completely ambulatory Physical Exam Constitutional:      General: He is not in acute distress.    Appearance: Normal appearance. He is normal weight.  HENT:     Head: Normocephalic and atraumatic.  Eyes:     General: No scleral icterus.    Extraocular Movements: Extraocular movements intact.     Conjunctiva/sclera: Conjunctivae normal.     Pupils: Pupils are equal, round, and reactive to light.  Cardiovascular:     Rate and Rhythm: Normal rate and regular rhythm.     Pulses: Normal pulses.     Heart sounds: Normal heart sounds. No murmur heard.    No friction rub. No gallop.  Pulmonary:     Effort: Pulmonary effort is normal. No respiratory distress.     Breath sounds: Normal breath sounds.  Abdominal:     General: Bowel sounds are normal. There is no  distension.     Palpations: Abdomen is soft. There is no hepatomegaly, splenomegaly or mass.     Tenderness: There is no abdominal tenderness.  Musculoskeletal:        General: Normal range of motion.     Cervical back: Normal range of motion and neck supple.     Right lower leg: No edema.     Left lower leg: No edema.  Lymphadenopathy:     Cervical: No cervical adenopathy.  Skin:    General: Skin is warm and dry.  Neurological:     General: No focal deficit present.     Mental Status: He is alert and oriented to person, place, and time. Mental status is at baseline.  Psychiatric:        Mood and Affect: Mood normal.        Behavior: Behavior normal.        Thought Content: Thought content normal.        Judgment: Judgment normal.    LABS:    Latest Reference Range & Units 11/03/21 00:00  WBC  6.2 (E)  RBC 3.87 - 5.11  4.24 (E)  Hemoglobin 13.5 - 17.5  13.2 ! (E)  HCT 41 - 53  40 ! (E)  Platelets 150 - 400 K/uL 271 (E)  NEUT#  4.03 (E)  !: Data is abnormal (E): External lab result  ASSESSMENT & PLAN:  Assessment/Plan:  A 71 y.o. male with a severe form of splenectomy-refractory ITP which had been recalcitrant to multiple interventions, but ultimately responded to the combination of Rituxan and Nplate. I am pleased as his platelets remain at ideal levels over 16 out from last receiving Rituxan/Nplate.  Clinically, he is doing well.  I will see him back in 6 months for repeat clinical assessment.   The patient understands all the plans discussed today and is in agreement with them.    Ascher Schroepfer Macarthur Critchley, MD

## 2021-11-03 ENCOUNTER — Other Ambulatory Visit: Payer: Self-pay

## 2021-11-03 ENCOUNTER — Telehealth: Payer: Self-pay | Admitting: Oncology

## 2021-11-03 ENCOUNTER — Inpatient Hospital Stay: Payer: Medicare Other | Attending: Oncology

## 2021-11-03 ENCOUNTER — Inpatient Hospital Stay (INDEPENDENT_AMBULATORY_CARE_PROVIDER_SITE_OTHER): Payer: Medicare Other | Admitting: Oncology

## 2021-11-03 ENCOUNTER — Other Ambulatory Visit: Payer: Self-pay | Admitting: Oncology

## 2021-11-03 VITALS — BP 131/82 | HR 64 | Temp 98.4°F | Resp 16 | Ht 68.0 in | Wt 192.9 lb

## 2021-11-03 DIAGNOSIS — D693 Immune thrombocytopenic purpura: Secondary | ICD-10-CM

## 2021-11-03 LAB — CBC AND DIFFERENTIAL
HCT: 40 — AB (ref 41–53)
Hemoglobin: 13.2 — AB (ref 13.5–17.5)
Neutrophils Absolute: 4.03
Platelets: 271 10*3/uL (ref 150–400)
WBC: 6.2

## 2021-11-03 LAB — CBC: RBC: 4.24 (ref 3.87–5.11)

## 2021-11-03 NOTE — Telephone Encounter (Signed)
Per 11/03/21 los next appt scheduled and confirmed with patient 

## 2021-12-15 DIAGNOSIS — E039 Hypothyroidism, unspecified: Secondary | ICD-10-CM | POA: Diagnosis not present

## 2021-12-15 DIAGNOSIS — E7800 Pure hypercholesterolemia, unspecified: Secondary | ICD-10-CM | POA: Diagnosis not present

## 2021-12-15 DIAGNOSIS — I10 Essential (primary) hypertension: Secondary | ICD-10-CM | POA: Diagnosis not present

## 2022-02-16 DIAGNOSIS — Z23 Encounter for immunization: Secondary | ICD-10-CM | POA: Diagnosis not present

## 2022-03-27 DIAGNOSIS — Z23 Encounter for immunization: Secondary | ICD-10-CM | POA: Diagnosis not present

## 2022-04-03 DIAGNOSIS — Z4682 Encounter for fitting and adjustment of non-vascular catheter: Secondary | ICD-10-CM | POA: Diagnosis not present

## 2022-04-03 DIAGNOSIS — I10 Essential (primary) hypertension: Secondary | ICD-10-CM | POA: Diagnosis not present

## 2022-04-03 DIAGNOSIS — Z431 Encounter for attention to gastrostomy: Secondary | ICD-10-CM | POA: Diagnosis not present

## 2022-04-03 DIAGNOSIS — I4891 Unspecified atrial fibrillation: Secondary | ICD-10-CM | POA: Diagnosis not present

## 2022-04-03 DIAGNOSIS — K9423 Gastrostomy malfunction: Secondary | ICD-10-CM | POA: Diagnosis not present

## 2022-04-30 DIAGNOSIS — R1310 Dysphagia, unspecified: Secondary | ICD-10-CM | POA: Diagnosis not present

## 2022-04-30 DIAGNOSIS — T66XXXS Radiation sickness, unspecified, sequela: Secondary | ICD-10-CM | POA: Diagnosis not present

## 2022-04-30 DIAGNOSIS — Z923 Personal history of irradiation: Secondary | ICD-10-CM | POA: Diagnosis not present

## 2022-04-30 DIAGNOSIS — Z85818 Personal history of malignant neoplasm of other sites of lip, oral cavity, and pharynx: Secondary | ICD-10-CM | POA: Diagnosis not present

## 2022-04-30 DIAGNOSIS — Z9889 Other specified postprocedural states: Secondary | ICD-10-CM | POA: Diagnosis not present

## 2022-04-30 DIAGNOSIS — M272 Inflammatory conditions of jaws: Secondary | ICD-10-CM | POA: Diagnosis not present

## 2022-04-30 DIAGNOSIS — I890 Lymphedema, not elsewhere classified: Secondary | ICD-10-CM | POA: Diagnosis not present

## 2022-04-30 DIAGNOSIS — Z87891 Personal history of nicotine dependence: Secondary | ICD-10-CM | POA: Diagnosis not present

## 2022-04-30 DIAGNOSIS — C099 Malignant neoplasm of tonsil, unspecified: Secondary | ICD-10-CM | POA: Diagnosis not present

## 2022-04-30 DIAGNOSIS — Z931 Gastrostomy status: Secondary | ICD-10-CM | POA: Diagnosis not present

## 2022-05-04 NOTE — Progress Notes (Signed)
[facility]  [address] [place],  [place]  [address] [phone]  Clinic Day:  10/31/2020  Referring physician: Mateo Flow, MD  HISTORY OF PRESENT ILLNESS:  The patient is a 71 y.o. male with splenectomy-refractory ITP.  Finally, after receiving 4 weekly doses of Rituxan and rapidly escalating doses of Nplate, this gentleman's platelets rose to more than satisfactory levels.  Due to his extremely high platelet levels which came from these interventions, the patient has been held off any form of therapy since February 2017.  He comes in today to reassess his platelets.  Since his last visit, the patient has been doing well.  He had 1 episode of bleeding at his tracheostomy site in May 2023.  However, at the time this occurred, his platelet count was over 300.  He denies having other bleeding/bruising issues which concern him for having recurrent ITP.  Marland Kitchen   PHYSICAL EXAM:  There were no vitals taken for this visit. There is no height or weight on file to calculate BMI. Performance status (ECOG): 1 - Symptomatic but completely ambulatory Physical Exam Constitutional:      General: He is not in acute distress.    Appearance: Normal appearance. He is normal weight.  HENT:     Head: Normocephalic and atraumatic.  Eyes:     General: No scleral icterus.    Extraocular Movements: Extraocular movements intact.     Conjunctiva/sclera: Conjunctivae normal.     Pupils: Pupils are equal, round, and reactive to light.  Cardiovascular:     Rate and Rhythm: Normal rate and regular rhythm.     Pulses: Normal pulses.     Heart sounds: Normal heart sounds. No murmur heard.    No friction rub. No gallop.  Pulmonary:     Effort: Pulmonary effort is normal. No respiratory distress.     Breath sounds: Normal breath sounds.  Abdominal:     General: Bowel sounds are normal. There is no distension.     Palpations: Abdomen is soft. There is no hepatomegaly,  splenomegaly or mass.     Tenderness: There is no abdominal tenderness.  Musculoskeletal:        General: Normal range of motion.     Cervical back: Normal range of motion and neck supple.     Right lower leg: No edema.     Left lower leg: No edema.  Lymphadenopathy:     Cervical: No cervical adenopathy.  Skin:    General: Skin is warm and dry.  Neurological:     General: No focal deficit present.     Mental Status: He is alert and oriented to person, place, and time. Mental status is at baseline.  Psychiatric:        Mood and Affect: Mood normal.        Behavior: Behavior normal.        Thought Content: Thought content normal.        Judgment: Judgment normal.   LABS:    Latest Reference Range & Units 11/03/21 00:00  WBC  6.2 (E)  RBC 3.87 - 5.11  4.24 (E)  Hemoglobin 13.5 - 17.5  13.2 ! (E)  HCT 41 - 53  40 ! (E)  Platelets 150 - 400 K/uL 271 (E)  NEUT#  4.03 (E)  !: Data is abnormal (E): External lab result  ASSESSMENT & PLAN:  Assessment/Plan:  A 71 y.o. male with a severe form of splenectomy-refractory ITP which had been  recalcitrant to multiple interventions, but ultimately responded to the combination of Rituxan and Nplate. I am pleased as his platelets remain at ideal levels over 16 out from last receiving Rituxan/Nplate.  Clinically, he is doing well.  I will see him back in 6 months for repeat clinical assessment.   The patient understands all the plans discussed today and is in agreement with them.    Camdon Saetern Macarthur Critchley, MD

## 2022-05-05 ENCOUNTER — Other Ambulatory Visit: Payer: Self-pay | Admitting: Oncology

## 2022-05-05 ENCOUNTER — Telehealth: Payer: Self-pay | Admitting: Oncology

## 2022-05-05 ENCOUNTER — Inpatient Hospital Stay: Payer: Medicare Other

## 2022-05-05 ENCOUNTER — Inpatient Hospital Stay: Payer: Medicare Other | Attending: Oncology | Admitting: Oncology

## 2022-05-05 VITALS — BP 157/89 | HR 80 | Temp 98.6°F | Resp 16 | Ht 68.0 in | Wt 192.0 lb

## 2022-05-05 DIAGNOSIS — C090 Malignant neoplasm of tonsillar fossa: Secondary | ICD-10-CM | POA: Diagnosis not present

## 2022-05-05 DIAGNOSIS — D693 Immune thrombocytopenic purpura: Secondary | ICD-10-CM

## 2022-05-05 DIAGNOSIS — D649 Anemia, unspecified: Secondary | ICD-10-CM | POA: Diagnosis not present

## 2022-05-05 LAB — CBC AND DIFFERENTIAL
HCT: 40 — AB (ref 41–53)
Hemoglobin: 13.6 (ref 13.5–17.5)
Neutrophils Absolute: 3.03
Platelets: 218 10*3/uL (ref 150–400)
WBC: 5.5

## 2022-05-05 LAB — CBC: RBC: 4.25 (ref 3.87–5.11)

## 2022-05-05 NOTE — Telephone Encounter (Signed)
05/05/22 Next appt scheduled and confirmed with patient 

## 2022-06-03 DIAGNOSIS — I10 Essential (primary) hypertension: Secondary | ICD-10-CM | POA: Diagnosis not present

## 2022-06-03 DIAGNOSIS — I420 Dilated cardiomyopathy: Secondary | ICD-10-CM | POA: Diagnosis not present

## 2022-06-03 DIAGNOSIS — Z931 Gastrostomy status: Secondary | ICD-10-CM | POA: Diagnosis not present

## 2022-06-16 DIAGNOSIS — E7800 Pure hypercholesterolemia, unspecified: Secondary | ICD-10-CM | POA: Diagnosis not present

## 2022-06-16 DIAGNOSIS — I10 Essential (primary) hypertension: Secondary | ICD-10-CM | POA: Diagnosis not present

## 2022-06-16 DIAGNOSIS — D693 Immune thrombocytopenic purpura: Secondary | ICD-10-CM | POA: Diagnosis not present

## 2022-06-16 DIAGNOSIS — Z0000 Encounter for general adult medical examination without abnormal findings: Secondary | ICD-10-CM | POA: Diagnosis not present

## 2022-06-16 DIAGNOSIS — E039 Hypothyroidism, unspecified: Secondary | ICD-10-CM | POA: Diagnosis not present

## 2022-06-16 DIAGNOSIS — Z79899 Other long term (current) drug therapy: Secondary | ICD-10-CM | POA: Diagnosis not present

## 2022-06-16 DIAGNOSIS — Z930 Tracheostomy status: Secondary | ICD-10-CM | POA: Diagnosis not present

## 2022-07-15 DIAGNOSIS — I420 Dilated cardiomyopathy: Secondary | ICD-10-CM | POA: Diagnosis not present

## 2022-09-03 IMAGING — CT CT HEAD W/O CM
3 series · 15 of 47 positions shown, 18 images · non-contrast
Comparison: June 12, 2020.

CLINICAL DATA: Fall

EXAM:
CT HEAD WITHOUT CONTRAST
TECHNIQUE: Contiguous axial images were obtained from the base of the skull
through the vertex without intravenous contrast.

[Series 2: head wo · axial · 0.47mm/px · z∈[-128,+2]mm · 9 of 32 slices shown, 12 images]
[im 3/32  brain]
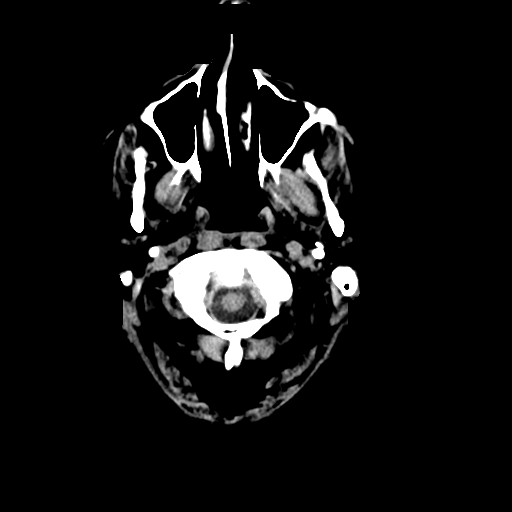
[im 3/32  bone]
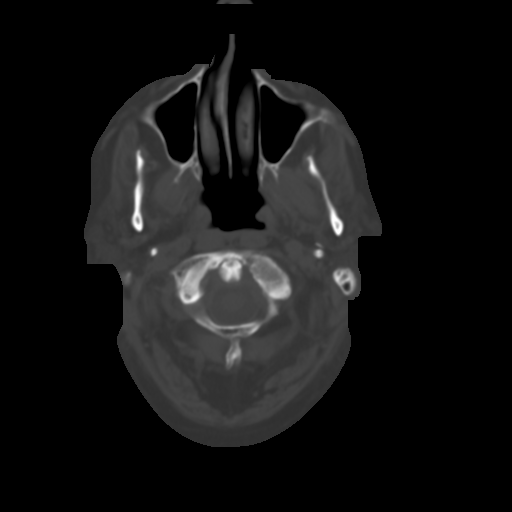
[im 6/32  brain]
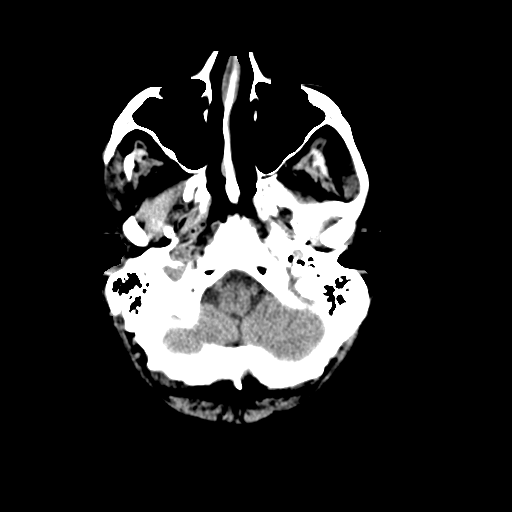
[im 9/32  brain]
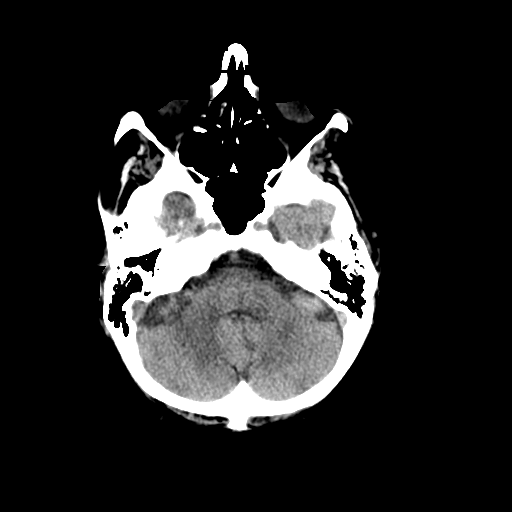
[im 12/32  brain]
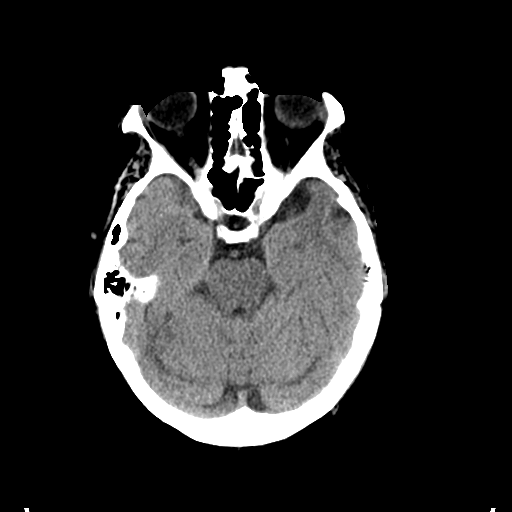
[im 17/32  brain]
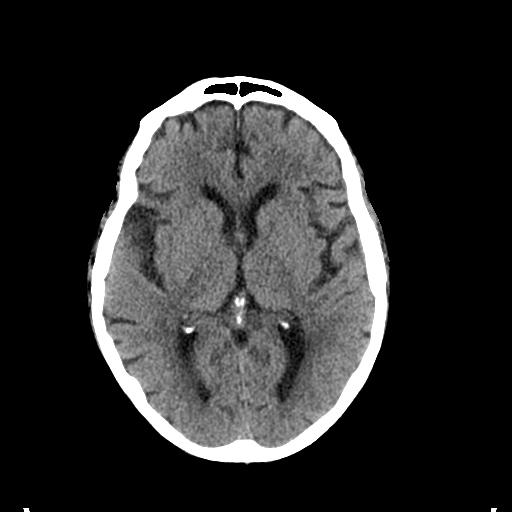
[im 17/32  bone]
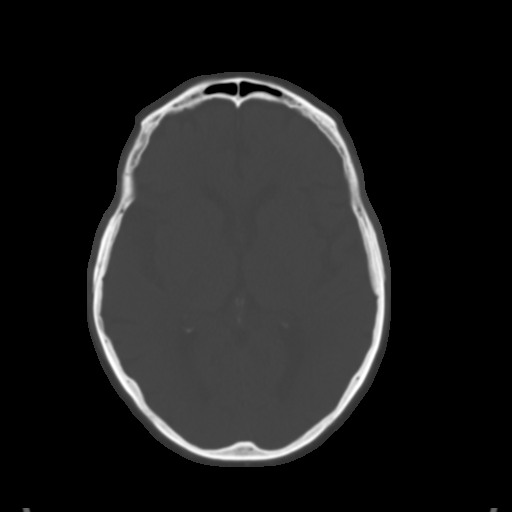
[im 20/32  brain]
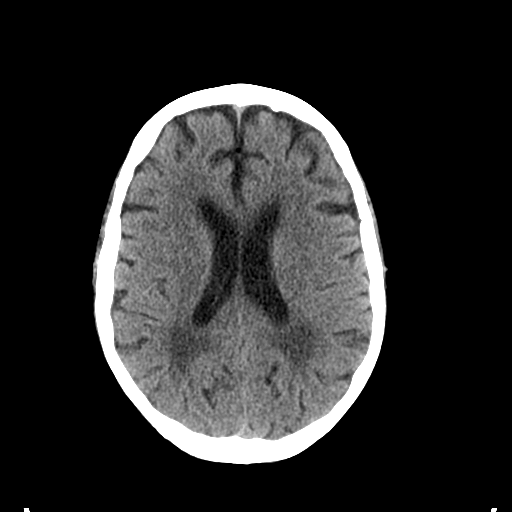
[im 23/32  brain]
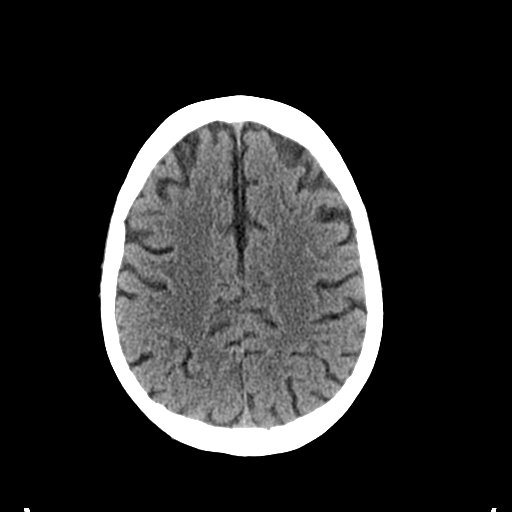
[im 26/32  brain]
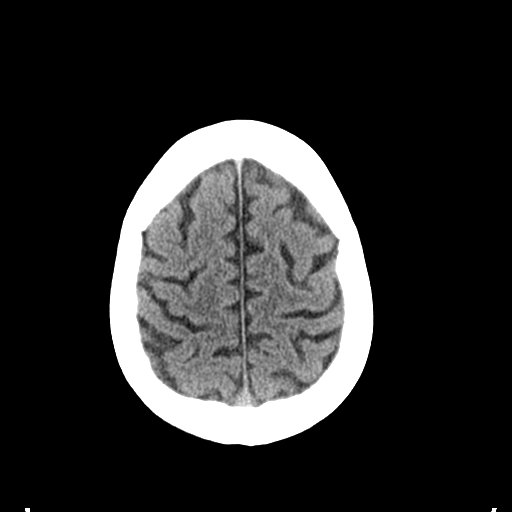
[im 29/32  brain]
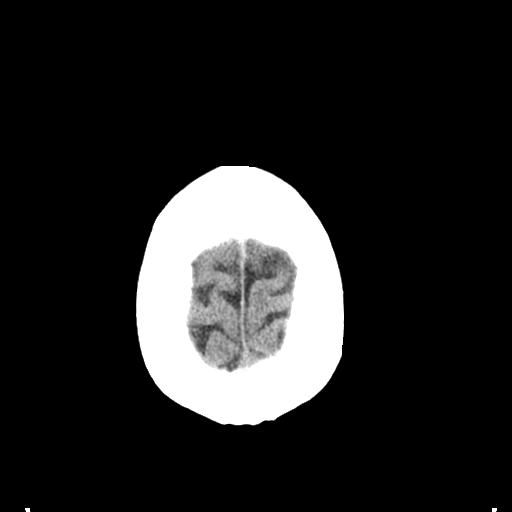
[im 29/32  bone]
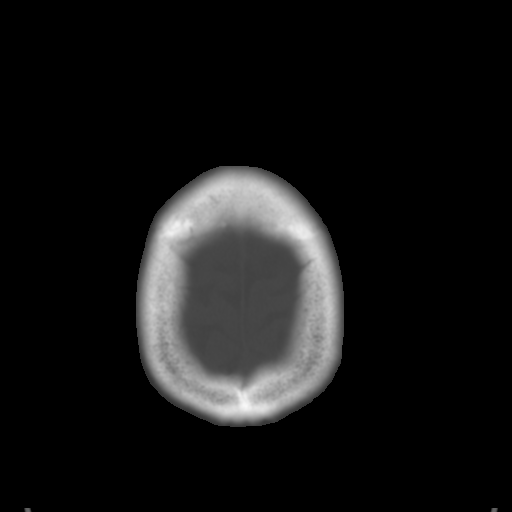

[Series 5: coronal soft tissue · coronal · 0.37mm/px · 3 of 69 slices shown]
[im 23/69  brain]
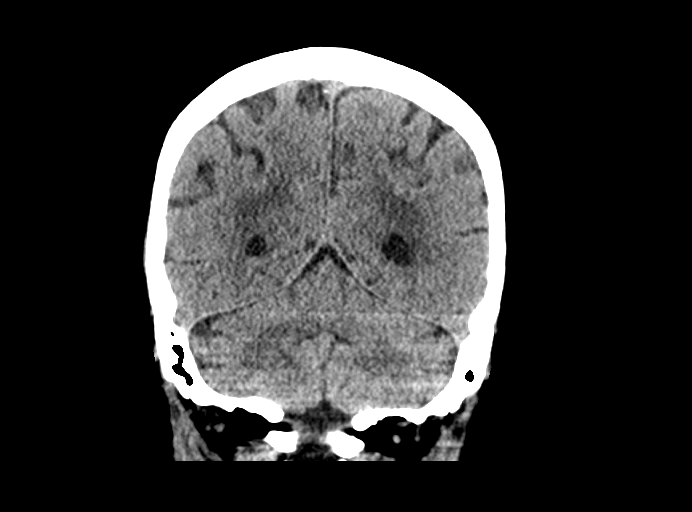
[im 31/69  brain]
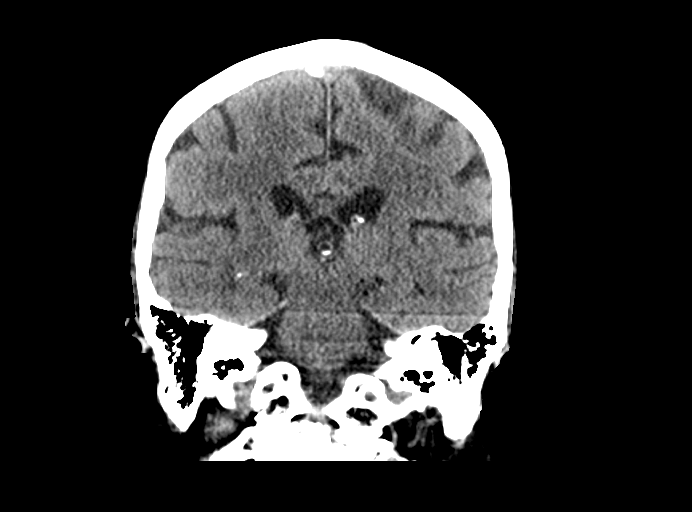
[im 38/69  brain]
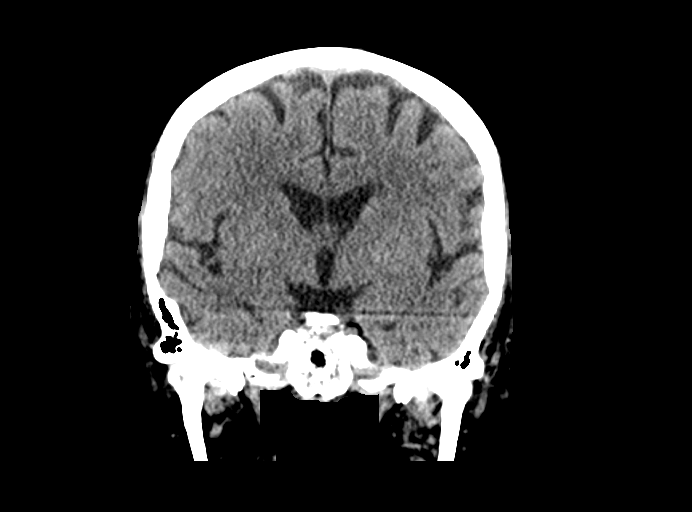

[Series 6: sagittal soft tissue · sagittal · 0.36mm/px · 3 of 55 slices shown]
[im 19/55  brain]
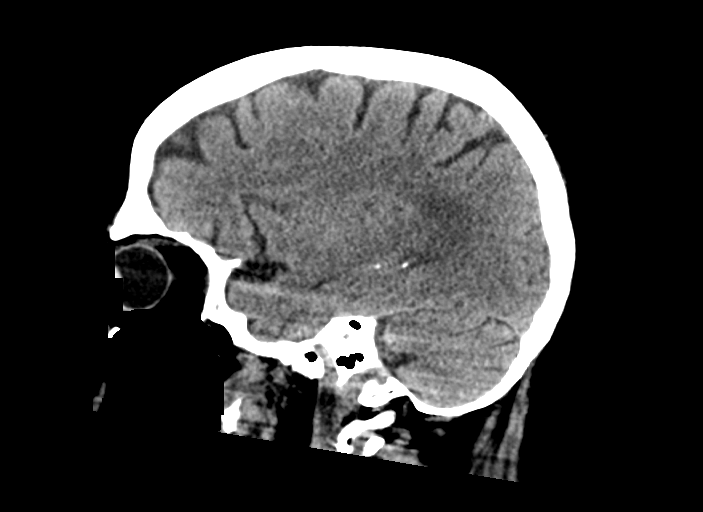
[im 28/55  brain]
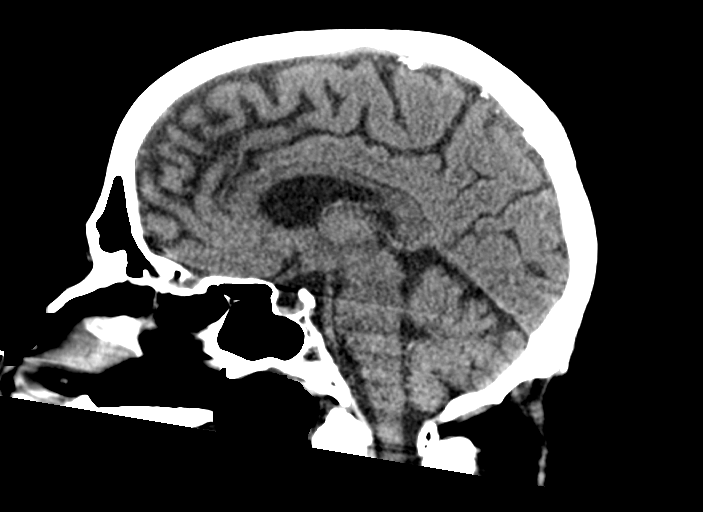
[im 37/55  brain]
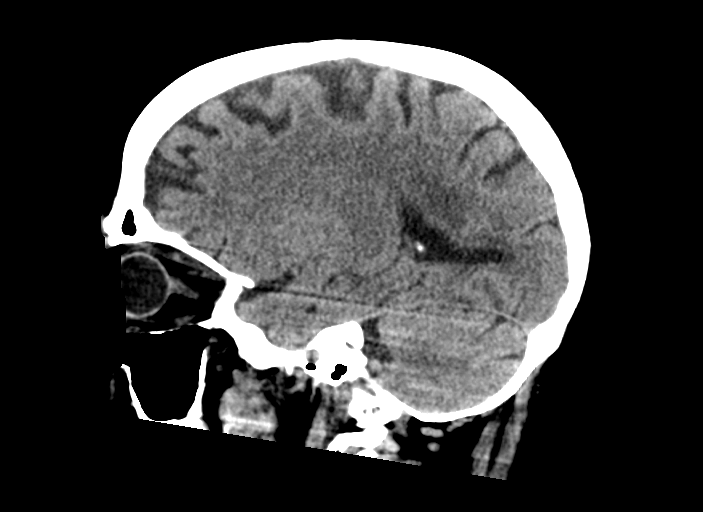

[15 of 47 positions shown; findings below may reference images not displayed]

FINDINGS: Brain: No evidence of acute infarction, hemorrhage, hydrocephalus,
extra-axial collection or mass lesion/mass effect. Periventricular
white matter hypodensities consistent with sequela of chronic
microvascular ischemic disease. Partially empty sella.

Vascular: No hyperdense vessel or unexpected calcification.

Skull: Normal. Negative for fracture or focal lesion.

Sinuses/Orbits: No acute finding.

Other: None
IMPRESSION: 1. No acute intracranial abnormality.

## 2022-09-30 DIAGNOSIS — Z5321 Procedure and treatment not carried out due to patient leaving prior to being seen by health care provider: Secondary | ICD-10-CM | POA: Diagnosis not present

## 2022-09-30 DIAGNOSIS — K9423 Gastrostomy malfunction: Secondary | ICD-10-CM | POA: Diagnosis not present

## 2022-09-30 DIAGNOSIS — T85528A Displacement of other gastrointestinal prosthetic devices, implants and grafts, initial encounter: Secondary | ICD-10-CM | POA: Diagnosis not present

## 2022-09-30 DIAGNOSIS — Z4659 Encounter for fitting and adjustment of other gastrointestinal appliance and device: Secondary | ICD-10-CM | POA: Diagnosis not present

## 2022-10-30 DIAGNOSIS — Z930 Tracheostomy status: Secondary | ICD-10-CM | POA: Diagnosis not present

## 2022-10-30 DIAGNOSIS — C099 Malignant neoplasm of tonsil, unspecified: Secondary | ICD-10-CM | POA: Diagnosis not present

## 2022-11-04 ENCOUNTER — Telehealth: Payer: Self-pay

## 2022-11-04 ENCOUNTER — Inpatient Hospital Stay: Payer: Medicare Other | Attending: Oncology | Admitting: Oncology

## 2022-11-04 ENCOUNTER — Inpatient Hospital Stay: Payer: Medicare Other

## 2022-11-04 ENCOUNTER — Other Ambulatory Visit: Payer: Self-pay | Admitting: Oncology

## 2022-11-04 ENCOUNTER — Other Ambulatory Visit: Payer: Self-pay | Admitting: Hematology and Oncology

## 2022-11-04 ENCOUNTER — Inpatient Hospital Stay: Payer: Medicare Other | Admitting: Oncology

## 2022-11-04 VITALS — BP 170/99 | HR 68 | Temp 97.9°F | Resp 16 | Ht 68.0 in | Wt 190.0 lb

## 2022-11-04 VITALS — BP 166/95 | HR 69 | Temp 98.6°F | Resp 18 | Ht 68.0 in | Wt 194.0 lb

## 2022-11-04 DIAGNOSIS — R042 Hemoptysis: Secondary | ICD-10-CM | POA: Diagnosis not present

## 2022-11-04 DIAGNOSIS — Z87891 Personal history of nicotine dependence: Secondary | ICD-10-CM

## 2022-11-04 DIAGNOSIS — D693 Immune thrombocytopenic purpura: Secondary | ICD-10-CM

## 2022-11-04 DIAGNOSIS — E039 Hypothyroidism, unspecified: Secondary | ICD-10-CM | POA: Diagnosis present

## 2022-11-04 DIAGNOSIS — D62 Acute posthemorrhagic anemia: Secondary | ICD-10-CM | POA: Diagnosis not present

## 2022-11-04 DIAGNOSIS — Z9081 Acquired absence of spleen: Secondary | ICD-10-CM | POA: Diagnosis not present

## 2022-11-04 DIAGNOSIS — Z862 Personal history of diseases of the blood and blood-forming organs and certain disorders involving the immune mechanism: Secondary | ICD-10-CM | POA: Diagnosis not present

## 2022-11-04 DIAGNOSIS — J384 Edema of larynx: Secondary | ICD-10-CM | POA: Diagnosis present

## 2022-11-04 DIAGNOSIS — E785 Hyperlipidemia, unspecified: Secondary | ICD-10-CM | POA: Diagnosis not present

## 2022-11-04 DIAGNOSIS — I420 Dilated cardiomyopathy: Secondary | ICD-10-CM | POA: Diagnosis not present

## 2022-11-04 DIAGNOSIS — F419 Anxiety disorder, unspecified: Secondary | ICD-10-CM | POA: Diagnosis present

## 2022-11-04 DIAGNOSIS — C090 Malignant neoplasm of tonsillar fossa: Secondary | ICD-10-CM | POA: Diagnosis not present

## 2022-11-04 DIAGNOSIS — I700 Atherosclerosis of aorta: Secondary | ICD-10-CM | POA: Diagnosis not present

## 2022-11-04 DIAGNOSIS — Z85818 Personal history of malignant neoplasm of other sites of lip, oral cavity, and pharynx: Secondary | ICD-10-CM

## 2022-11-04 DIAGNOSIS — Z930 Tracheostomy status: Secondary | ICD-10-CM | POA: Diagnosis not present

## 2022-11-04 DIAGNOSIS — K8020 Calculus of gallbladder without cholecystitis without obstruction: Secondary | ICD-10-CM | POA: Diagnosis not present

## 2022-11-04 DIAGNOSIS — Z923 Personal history of irradiation: Secondary | ICD-10-CM | POA: Diagnosis not present

## 2022-11-04 DIAGNOSIS — M8730 Other secondary osteonecrosis, unspecified bone: Secondary | ICD-10-CM | POA: Diagnosis not present

## 2022-11-04 DIAGNOSIS — M272 Inflammatory conditions of jaws: Secondary | ICD-10-CM | POA: Diagnosis present

## 2022-11-04 DIAGNOSIS — Z8589 Personal history of malignant neoplasm of other organs and systems: Secondary | ICD-10-CM | POA: Diagnosis not present

## 2022-11-04 DIAGNOSIS — Y842 Radiological procedure and radiotherapy as the cause of abnormal reaction of the patient, or of later complication, without mention of misadventure at the time of the procedure: Secondary | ICD-10-CM | POA: Diagnosis present

## 2022-11-04 DIAGNOSIS — M8738 Other secondary osteonecrosis, other site: Secondary | ICD-10-CM | POA: Diagnosis present

## 2022-11-04 DIAGNOSIS — Z931 Gastrostomy status: Secondary | ICD-10-CM | POA: Diagnosis not present

## 2022-11-04 DIAGNOSIS — J9501 Hemorrhage from tracheostomy stoma: Principal | ICD-10-CM | POA: Diagnosis present

## 2022-11-04 DIAGNOSIS — Z79890 Hormone replacement therapy (postmenopausal): Secondary | ICD-10-CM

## 2022-11-04 DIAGNOSIS — Z751 Person awaiting admission to adequate facility elsewhere: Secondary | ICD-10-CM

## 2022-11-04 DIAGNOSIS — I959 Hypotension, unspecified: Secondary | ICD-10-CM | POA: Diagnosis not present

## 2022-11-04 DIAGNOSIS — Z79899 Other long term (current) drug therapy: Secondary | ICD-10-CM

## 2022-11-04 DIAGNOSIS — J810 Acute pulmonary edema: Secondary | ICD-10-CM | POA: Diagnosis not present

## 2022-11-04 DIAGNOSIS — K1379 Other lesions of oral mucosa: Secondary | ICD-10-CM | POA: Diagnosis not present

## 2022-11-04 DIAGNOSIS — F32A Depression, unspecified: Secondary | ICD-10-CM | POA: Diagnosis present

## 2022-11-04 DIAGNOSIS — R918 Other nonspecific abnormal finding of lung field: Secondary | ICD-10-CM | POA: Diagnosis not present

## 2022-11-04 DIAGNOSIS — R059 Cough, unspecified: Secondary | ICD-10-CM | POA: Diagnosis not present

## 2022-11-04 DIAGNOSIS — R1310 Dysphagia, unspecified: Secondary | ICD-10-CM | POA: Diagnosis not present

## 2022-11-04 DIAGNOSIS — D696 Thrombocytopenia, unspecified: Secondary | ICD-10-CM | POA: Diagnosis not present

## 2022-11-04 DIAGNOSIS — I2510 Atherosclerotic heart disease of native coronary artery without angina pectoris: Secondary | ICD-10-CM | POA: Diagnosis not present

## 2022-11-04 DIAGNOSIS — R58 Hemorrhage, not elsewhere classified: Secondary | ICD-10-CM | POA: Diagnosis not present

## 2022-11-04 DIAGNOSIS — R040 Epistaxis: Secondary | ICD-10-CM | POA: Diagnosis present

## 2022-11-04 DIAGNOSIS — E079 Disorder of thyroid, unspecified: Secondary | ICD-10-CM | POA: Diagnosis not present

## 2022-11-04 DIAGNOSIS — I6523 Occlusion and stenosis of bilateral carotid arteries: Secondary | ICD-10-CM | POA: Diagnosis not present

## 2022-11-04 DIAGNOSIS — D649 Anemia, unspecified: Secondary | ICD-10-CM | POA: Diagnosis not present

## 2022-11-04 DIAGNOSIS — J439 Emphysema, unspecified: Secondary | ICD-10-CM | POA: Diagnosis not present

## 2022-11-04 DIAGNOSIS — I10 Essential (primary) hypertension: Secondary | ICD-10-CM | POA: Diagnosis not present

## 2022-11-04 DIAGNOSIS — Z85819 Personal history of malignant neoplasm of unspecified site of lip, oral cavity, and pharynx: Secondary | ICD-10-CM | POA: Diagnosis not present

## 2022-11-04 LAB — CBC AND DIFFERENTIAL
HCT: 41 (ref 41–53)
Hemoglobin: 14.1 (ref 13.5–17.5)
Neutrophils Absolute: 4.93
Platelets: 4 10*3/uL — AB (ref 150–400)
WBC: 7.7

## 2022-11-04 LAB — BPAM PLATELET PHERESIS
Blood Product Expiration Date: 202406222359
ISSUE DATE / TIME: 202406191134

## 2022-11-04 LAB — PREPARE PLATELET PHERESIS

## 2022-11-04 LAB — CBC: RBC: 4.38 (ref 3.87–5.11)

## 2022-11-04 MED ORDER — SODIUM CHLORIDE 0.9% IV SOLUTION
250.0000 mL | Freq: Once | INTRAVENOUS | Status: AC
  Administered 2022-11-04: 250 mL via INTRAVENOUS

## 2022-11-04 MED ORDER — DEXAMETHASONE 4 MG PO TABS: ORAL_TABLET | ORAL | 0 refills | Status: DC

## 2022-11-04 MED ORDER — ACETAMINOPHEN 325 MG PO TABS: 650.0000 mg | ORAL_TABLET | Freq: Once | ORAL | Status: DC

## 2022-11-04 MED ORDER — DIPHENHYDRAMINE HCL 50 MG/ML IJ SOLN
25.0000 mg | Freq: Once | INTRAMUSCULAR | Status: AC
  Administered 2022-11-04: 25 mg via INTRAVENOUS
  Filled 2022-11-04: qty 1

## 2022-11-04 MED ORDER — ROMIPLOSTIM INJECTION 500 MCG
375.0000 ug | Freq: Once | SUBCUTANEOUS | Status: AC
  Administered 2022-11-04: 375 ug via SUBCUTANEOUS
  Filled 2022-11-04: qty 0.5

## 2022-11-04 MED ORDER — DIPHENHYDRAMINE HCL 25 MG PO CAPS: 25.0000 mg | ORAL_CAPSULE | Freq: Once | ORAL | Status: DC

## 2022-11-04 NOTE — Progress Notes (Signed)
[facility]  [address] [place],  [place]  [address] [phone]  Clinic Day:  11/04/2022  Referring physician: Lise Auer, MD  HISTORY OF PRESENT ILLNESS:  The patient is a 72 y.o. male with splenectomy-refractory ITP.  Finally, after receiving 4 weekly doses of Rituxan and rapidly escalating doses of Nplate, this gentleman's platelets rose to more than satisfactory levels.  Due to his extremely high platelet levels which came from these interventions, the patient has been held off any form of therapy since February 2022.  He comes in today to reassess his platelets.  Unfortunately, within the past 24 hours, he has begun to have both hemoptysis and epistaxis.  His wife noticed an area of ecchymosis on his right leg yesterday.  Understandably, they are concerned with recurrent, severe IT being present.    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  The patient completed radiation for this cancer in April 2017.  He was recently seen at [facility] where his evaluation showed him to remain disease free.  PHYSICAL EXAM:  Blood pressure (!) 170/99, pulse 68, temperature 97.9 F (36.6 C), resp. rate 16, height 5\' 8"  (1.727 m), weight 190 lb (86.2 kg), SpO2 97 %. Body mass index is 28.89 kg/m. Performance status (ECOG): 1 - Symptomatic but completely ambulatory Physical Exam Constitutional:      General: He is not in acute distress.    Appearance: Normal appearance. He is normal weight.  HENT:     Head: Normocephalic and atraumatic.     Nose:     Right Nostril: Epistaxis present.     Left Nostril: Epistaxis present.     Mouth/Throat:     Comments: Blood is seen in his oropharynx Eyes:     General: No scleral icterus.    Extraocular Movements: Extraocular movements intact.     Conjunctiva/sclera: Conjunctivae normal.     Pupils: Pupils are equal, round, and  reactive to light.  Cardiovascular:     Rate and Rhythm: Normal rate and regular rhythm.     Pulses: Normal pulses.     Heart sounds: Normal heart sounds. No murmur heard.    No friction rub. No gallop.  Pulmonary:     Effort: Pulmonary effort is normal. No respiratory distress.     Breath sounds: Normal breath sounds.  Abdominal:     General: Bowel sounds are normal. There is no distension.     Palpations: Abdomen is soft. There is no hepatomegaly, splenomegaly or mass.     Tenderness: There is no abdominal tenderness.  Musculoskeletal:        General: Normal range of motion.     Cervical back: Normal range of motion and neck supple.     Right lower leg: No edema.     Left lower leg: No edema.  Lymphadenopathy:     Cervical: No cervical adenopathy.  Skin:    General: Skin is warm and dry.     Findings: Bruising (right chin ecchymosis) present.  Neurological:     General: No focal deficit present.     Mental Status: He is alert and oriented to person, place, and time. Mental status is at baseline.  Psychiatric:        Mood and Affect: Mood normal.        Behavior: Behavior normal.        Thought Content:  Thought content normal.        Judgment: Judgment normal.    LABS:    ASSESSMENT & PLAN:  Assessment/Plan:  A 72 y.o. male who unfortunately has recurrence of severe, splenectomy-refractory ITP.  This gentleman will be placed back on weekly Nplate, with the dose initially being 4 mcg/kg weekly.   I will also place him on Decadron 40 mg daily x 4 days.  We will also arrange for him to be transfused 2 units of platelets today.  Moving forward, he will be followed weekly to see how well he is responding to the aforementioned interventions.  The patient understands all the plans discussed today and knows to contact our office if he runs into any problems that require immediate clinical attention.    Dalma Panchal Kirby Funk, MD

## 2022-11-04 NOTE — Patient Instructions (Signed)
Thrombocytopenia Thrombocytopenia means that you have a low number of platelets in your blood. Platelets are tiny cells in the blood. When you bleed, they clump together at the cut or injury to stop the bleeding. This is called blood clotting. If you do not have enough platelets, your blood may have trouble clotting. This may cause you to bleed and bruise very easily. What are the causes? This condition is caused by a low number of platelets in your blood. There are three main reasons for this: Your body not making enough platelets. This may be caused by: Bone marrow diseases. Disorders that are passed from parent to child (inherited). Certain cancer medicines or treatments. Infection from germs (bacteria or viruses). Alcoholism. Platelets not being released in the blood. This can be caused by: Having a spleen that is larger than normal. A condition called Gaucher disease. Your body destroying platelets too quickly. This may be caused by: Certain autoimmune diseases. Some medicines that thin your blood. Certain blood clotting disorders. Certain bleeding disorders. Exposure to harmful (toxic) chemicals. Pregnancy. What are the signs or symptoms? Bruising easily. Bleeding from the nose or mouth. Heavy menstrual periods. Blood in the pee (urine), poop (stool), or vomit. A purple-red color to the skin (purpura). A rash that looks like pinpoint, purple-red spots (petechiae) on the lower legs. How is this treated? Treatment depends on the cause. Treatment may include: Treatment of another condition that is causing the low platelet count. Medicines to help protect your platelets from being destroyed. A replacement (transfusion) of platelets to stop or prevent bleeding. Surgery to take out the spleen. Follow these instructions at home: Medicines Take over-the-counter and prescription medicines only as told by your doctor. Do not take any medicines that have aspirin or NSAIDs, such as  ibuprofen. Activity Avoid doing things that could hurt or bruise you. Take action to prevent falls. Do not play contact sports. Ask your doctor what activities are safe for you. Take care not to burn yourself: When you use an iron. When you cook. Take care not to cut yourself: When you shave. When you use scissors, needles, knives, or other tools. General instructions  Check your skin and the inside of your mouth for bruises or blood as told by your doctor. Wear a medical alert bracelet that says that you have a bleeding disorder. Check to see if there is blood in your pee and poop. Do this as told by your doctor. Do not drink alcohol. If you do drink, limit the amount that you drink. Stay away from harmful (toxic) chemicals. Tell all of your doctors that you have this condition. Be sure to tell your dentist and eye doctor. Tell your dentist about your condition before you have your teeth cleaned. Keep all follow-up visits. Contact a doctor if: You have bruises and you do not know why. You have new symptoms. You have symptoms that get worse. You have a fever. Get help right away if: You have very bad bleeding anywhere on your body. You have blood in your vomit, pee, or poop. You have an injury to your head. You have a sudden, very bad headache. Summary Thrombocytopenia means that you have a low number of platelets in your blood. Platelets stick together to form a clot. Symptoms of this condition include getting bruises easily, bleeding from the mouth and nose, a purple-red color to the skin, and a rash. Take care not to cut or burn yourself. This information is not intended to replace advice given   to you by your health care provider. Make sure you discuss any questions you have with your health care provider. Document Revised: 10/17/2020 Document Reviewed: 10/17/2020 Elsevier Patient Education  2024 [employer].

## 2022-11-04 NOTE — Progress Notes (Signed)
Ok to proceed with Nplate today. No auth required. Begin Nplate at 4 mcg/kg per MD.  Richardean Sale, RPH, BCPS, BCOP 11/04/2022 1:07 PM

## 2022-11-04 NOTE — Telephone Encounter (Signed)
CRITICAL VALUE STICKER  CRITICAL VALUE: Platelets 4  RECEIVER (on-site recipient of call):Katilynn Sinkler,RN  DATE & TIME NOTIFIED:  11/04/2022 @ 1028  MESSENGER (representative from lab): Scottie in [facility] lab  MD NOTIFIED: Wynelle Fanny  TIME OF NOTIFICATION: 1031  RESPONSE:  Pt will be getting Nplate, and platelet pheresis today.

## 2022-11-05 ENCOUNTER — Other Ambulatory Visit: Payer: Self-pay | Admitting: Hematology and Oncology

## 2022-11-05 ENCOUNTER — Other Ambulatory Visit: Payer: Self-pay | Admitting: Oncology

## 2022-11-05 ENCOUNTER — Telehealth: Payer: Self-pay

## 2022-11-05 ENCOUNTER — Inpatient Hospital Stay: Payer: Medicare Other

## 2022-11-05 ENCOUNTER — Encounter: Payer: Self-pay | Admitting: Oncology

## 2022-11-05 VITALS — BP 130/77 | HR 74 | Temp 98.2°F | Resp 18 | Ht 68.0 in | Wt 195.0 lb

## 2022-11-05 DIAGNOSIS — D649 Anemia, unspecified: Secondary | ICD-10-CM | POA: Diagnosis not present

## 2022-11-05 DIAGNOSIS — D693 Immune thrombocytopenic purpura: Secondary | ICD-10-CM

## 2022-11-05 LAB — PREPARE PLATELET PHERESIS
Unit division: 0
Unit division: 0
Unit division: 0

## 2022-11-05 LAB — BPAM PLATELET PHERESIS
Blood Product Expiration Date: 202406212359
Blood Product Expiration Date: 202406212359
ISSUE DATE / TIME: 202406191134
Unit Type and Rh: 5100
Unit Type and Rh: 6200

## 2022-11-05 LAB — CBC AND DIFFERENTIAL
HCT: 38 — AB (ref 41–53)
Hemoglobin: 12.9 — AB (ref 13.5–17.5)
Neutrophils Absolute: 16.55
Platelets: 10 10*3/uL — AB (ref 150–400)
WBC: 17.8

## 2022-11-05 LAB — CBC: RBC: 4.02 (ref 3.87–5.11)

## 2022-11-05 MED ORDER — ROMIPLOSTIM INJECTION 500 MCG
375.0000 ug | Freq: Once | SUBCUTANEOUS | Status: AC
  Administered 2022-11-05: 375 ug via SUBCUTANEOUS
  Filled 2022-11-05: qty 0.5

## 2022-11-05 MED ORDER — SODIUM CHLORIDE 0.9% IV SOLUTION
250.0000 mL | Freq: Once | INTRAVENOUS | Status: AC
  Administered 2022-11-05: 250 mL via INTRAVENOUS

## 2022-11-05 MED ORDER — ACETAMINOPHEN 325 MG PO TABS: 650.0000 mg | ORAL_TABLET | Freq: Once | ORAL | Status: DC

## 2022-11-05 MED ORDER — DIPHENHYDRAMINE HCL 25 MG PO CAPS: 25.0000 mg | ORAL_CAPSULE | Freq: Once | ORAL | Status: DC

## 2022-11-05 NOTE — Patient Instructions (Signed)

## 2022-11-05 NOTE — Progress Notes (Signed)
Ok to proceed with Nplate 4 mcg/kg today. Last Nplate dose on 11/04/22. Patient will receive Ruxience tomorrow.  Anola Gurney Mason, [place], BCPS, BCOP 11/05/2022 1:43 PM

## 2022-11-05 NOTE — Telephone Encounter (Signed)
Pt received 2 units platelets yesterday and Nplate @ infusion center. He is calling this morning to report he bled right much last night. He wants to know what Dr Melvyn Neth wants him to do? He has appt for labs & f/u in am.

## 2022-11-05 NOTE — Telephone Encounter (Signed)
CRITICAL VALUE STICKER  CRITICAL VALUE: Platelet level of 10  RECEIVER (on-site recipient of call): Yumiko Alkins,RN  DATE & TIME NOTIFIED: 11/05/2022 @ 1026  MESSENGER (representative from lab): Marcelino Duster @ [facility] lab  MD NOTIFIED: Dr Rennis Harding  TIME OF NOTIFICATION:1030  RESPONSE: Dr Melvyn Neth ordered 1 unit platelets today, & another dose of Nplate.

## 2022-11-06 ENCOUNTER — Encounter (HOSPITAL_COMMUNITY): Payer: Self-pay | Admitting: Emergency Medicine

## 2022-11-06 ENCOUNTER — Other Ambulatory Visit: Payer: Self-pay

## 2022-11-06 ENCOUNTER — Emergency Department (HOSPITAL_COMMUNITY): Payer: Medicare Other

## 2022-11-06 ENCOUNTER — Inpatient Hospital Stay: Payer: Medicare Other

## 2022-11-06 ENCOUNTER — Inpatient Hospital Stay (HOSPITAL_COMMUNITY)
Admission: EM | Admit: 2022-11-06 | Discharge: 2022-11-08 | DRG: 206 | Disposition: A | Discharge status: Other Acute Inpatient Hospital | Payer: Medicare Other | Source: Other Acute Inpatient Hospital | Attending: Internal Medicine | Admitting: Internal Medicine

## 2022-11-06 ENCOUNTER — Encounter: Payer: Self-pay | Admitting: Oncology

## 2022-11-06 ENCOUNTER — Inpatient Hospital Stay: Payer: Medicare Other | Admitting: Oncology

## 2022-11-06 ENCOUNTER — Telehealth: Payer: Self-pay

## 2022-11-06 VITALS — BP 120/86 | HR 79 | Temp 98.1°F | Resp 18

## 2022-11-06 DIAGNOSIS — D649 Anemia, unspecified: Secondary | ICD-10-CM | POA: Diagnosis not present

## 2022-11-06 DIAGNOSIS — R042 Hemoptysis: Secondary | ICD-10-CM | POA: Diagnosis present

## 2022-11-06 DIAGNOSIS — F32A Depression, unspecified: Secondary | ICD-10-CM | POA: Diagnosis present

## 2022-11-06 DIAGNOSIS — M272 Inflammatory conditions of jaws: Secondary | ICD-10-CM | POA: Diagnosis present

## 2022-11-06 DIAGNOSIS — J439 Emphysema, unspecified: Secondary | ICD-10-CM | POA: Diagnosis not present

## 2022-11-06 DIAGNOSIS — C090 Malignant neoplasm of tonsillar fossa: Secondary | ICD-10-CM | POA: Diagnosis not present

## 2022-11-06 DIAGNOSIS — J384 Edema of larynx: Secondary | ICD-10-CM | POA: Diagnosis present

## 2022-11-06 DIAGNOSIS — Z8589 Personal history of malignant neoplasm of other organs and systems: Secondary | ICD-10-CM | POA: Diagnosis not present

## 2022-11-06 DIAGNOSIS — J9501 Hemorrhage from tracheostomy stoma: Secondary | ICD-10-CM

## 2022-11-06 DIAGNOSIS — E785 Hyperlipidemia, unspecified: Secondary | ICD-10-CM | POA: Diagnosis present

## 2022-11-06 DIAGNOSIS — Z923 Personal history of irradiation: Secondary | ICD-10-CM | POA: Diagnosis not present

## 2022-11-06 DIAGNOSIS — K8020 Calculus of gallbladder without cholecystitis without obstruction: Secondary | ICD-10-CM | POA: Diagnosis not present

## 2022-11-06 DIAGNOSIS — D693 Immune thrombocytopenic purpura: Secondary | ICD-10-CM | POA: Diagnosis present

## 2022-11-06 DIAGNOSIS — I6523 Occlusion and stenosis of bilateral carotid arteries: Secondary | ICD-10-CM | POA: Diagnosis not present

## 2022-11-06 DIAGNOSIS — Z751 Person awaiting admission to adequate facility elsewhere: Secondary | ICD-10-CM | POA: Diagnosis not present

## 2022-11-06 DIAGNOSIS — Z85819 Personal history of malignant neoplasm of unspecified site of lip, oral cavity, and pharynx: Secondary | ICD-10-CM | POA: Diagnosis not present

## 2022-11-06 DIAGNOSIS — I10 Essential (primary) hypertension: Secondary | ICD-10-CM | POA: Diagnosis present

## 2022-11-06 DIAGNOSIS — D62 Acute posthemorrhagic anemia: Secondary | ICD-10-CM | POA: Diagnosis present

## 2022-11-06 DIAGNOSIS — I700 Atherosclerosis of aorta: Secondary | ICD-10-CM | POA: Diagnosis present

## 2022-11-06 DIAGNOSIS — K1379 Other lesions of oral mucosa: Secondary | ICD-10-CM | POA: Diagnosis not present

## 2022-11-06 DIAGNOSIS — Z931 Gastrostomy status: Secondary | ICD-10-CM | POA: Diagnosis not present

## 2022-11-06 DIAGNOSIS — I959 Hypotension, unspecified: Secondary | ICD-10-CM | POA: Diagnosis not present

## 2022-11-06 DIAGNOSIS — Z87891 Personal history of nicotine dependence: Secondary | ICD-10-CM | POA: Diagnosis not present

## 2022-11-06 DIAGNOSIS — E079 Disorder of thyroid, unspecified: Secondary | ICD-10-CM | POA: Diagnosis not present

## 2022-11-06 DIAGNOSIS — Z79890 Hormone replacement therapy (postmenopausal): Secondary | ICD-10-CM | POA: Diagnosis not present

## 2022-11-06 DIAGNOSIS — Z79899 Other long term (current) drug therapy: Secondary | ICD-10-CM | POA: Diagnosis not present

## 2022-11-06 DIAGNOSIS — I420 Dilated cardiomyopathy: Secondary | ICD-10-CM | POA: Diagnosis not present

## 2022-11-06 DIAGNOSIS — Y842 Radiological procedure and radiotherapy as the cause of abnormal reaction of the patient, or of later complication, without mention of misadventure at the time of the procedure: Secondary | ICD-10-CM | POA: Diagnosis present

## 2022-11-06 DIAGNOSIS — Z862 Personal history of diseases of the blood and blood-forming organs and certain disorders involving the immune mechanism: Secondary | ICD-10-CM | POA: Diagnosis not present

## 2022-11-06 DIAGNOSIS — R040 Epistaxis: Secondary | ICD-10-CM | POA: Diagnosis present

## 2022-11-06 DIAGNOSIS — E039 Hypothyroidism, unspecified: Secondary | ICD-10-CM | POA: Diagnosis present

## 2022-11-06 DIAGNOSIS — Z930 Tracheostomy status: Secondary | ICD-10-CM | POA: Diagnosis not present

## 2022-11-06 DIAGNOSIS — R059 Cough, unspecified: Secondary | ICD-10-CM | POA: Diagnosis not present

## 2022-11-06 DIAGNOSIS — Z9081 Acquired absence of spleen: Secondary | ICD-10-CM | POA: Diagnosis not present

## 2022-11-06 DIAGNOSIS — I2510 Atherosclerotic heart disease of native coronary artery without angina pectoris: Secondary | ICD-10-CM | POA: Diagnosis not present

## 2022-11-06 DIAGNOSIS — J810 Acute pulmonary edema: Secondary | ICD-10-CM | POA: Diagnosis not present

## 2022-11-06 DIAGNOSIS — M8738 Other secondary osteonecrosis, other site: Secondary | ICD-10-CM | POA: Diagnosis present

## 2022-11-06 DIAGNOSIS — D696 Thrombocytopenia, unspecified: Secondary | ICD-10-CM | POA: Diagnosis present

## 2022-11-06 DIAGNOSIS — F419 Anxiety disorder, unspecified: Secondary | ICD-10-CM | POA: Diagnosis present

## 2022-11-06 DIAGNOSIS — R1310 Dysphagia, unspecified: Secondary | ICD-10-CM | POA: Diagnosis not present

## 2022-11-06 DIAGNOSIS — Z85818 Personal history of malignant neoplasm of other sites of lip, oral cavity, and pharynx: Secondary | ICD-10-CM | POA: Diagnosis not present

## 2022-11-06 DIAGNOSIS — R58 Hemorrhage, not elsewhere classified: Secondary | ICD-10-CM | POA: Diagnosis not present

## 2022-11-06 DIAGNOSIS — M8730 Other secondary osteonecrosis, unspecified bone: Secondary | ICD-10-CM | POA: Diagnosis not present

## 2022-11-06 DIAGNOSIS — R918 Other nonspecific abnormal finding of lung field: Secondary | ICD-10-CM | POA: Diagnosis not present

## 2022-11-06 HISTORY — DX: Immune thrombocytopenic purpura: D69.3

## 2022-11-06 HISTORY — DX: Malignant neoplasm of head, face and neck: C76.0

## 2022-11-06 LAB — BPAM PLATELET PHERESIS
Blood Product Expiration Date: 202406232359
Blood Product Expiration Date: 202406232359
ISSUE DATE / TIME: 202406201145
ISSUE DATE / TIME: 202406211256
ISSUE DATE / TIME: 202406211256
Unit Type and Rh: 6200
Unit Type and Rh: 5100

## 2022-11-06 LAB — HEPATIC FUNCTION PANEL
ALT: 16 U/L (ref 0–44)
AST: 27 U/L (ref 15–41)
Albumin: 2.8 g/dL — ABNORMAL LOW (ref 3.5–5.0)
Alkaline Phosphatase: 45 U/L (ref 38–126)
Bilirubin, Direct: 0.2 mg/dL (ref 0.0–0.2)
Indirect Bilirubin: 0.4 mg/dL (ref 0.3–0.9)
Total Bilirubin: 0.6 mg/dL (ref 0.3–1.2)
Total Protein: 5.3 g/dL — ABNORMAL LOW (ref 6.5–8.1)

## 2022-11-06 LAB — CBC WITH DIFFERENTIAL/PLATELET
Abs Immature Granulocytes: 0.2 10*3/uL — ABNORMAL HIGH (ref 0.00–0.07)
Basophils Absolute: 0 10*3/uL (ref 0.0–0.1)
Basophils Relative: 0 %
Eosinophils Absolute: 0 10*3/uL (ref 0.0–0.5)
Eosinophils Relative: 0 %
HCT: 21.7 % — ABNORMAL LOW (ref 39.0–52.0)
Hemoglobin: 7.1 g/dL — ABNORMAL LOW (ref 13.0–17.0)
Immature Granulocytes: 1 %
Lymphocytes Relative: 1 %
Lymphs Abs: 0.4 10*3/uL — ABNORMAL LOW (ref 0.7–4.0)
MCH: 32.1 pg (ref 26.0–34.0)
MCHC: 32.7 g/dL (ref 30.0–36.0)
MCV: 98.2 fL (ref 80.0–100.0)
Monocytes Absolute: 0.9 10*3/uL (ref 0.1–1.0)
Monocytes Relative: 3 %
Neutro Abs: 25 10*3/uL — ABNORMAL HIGH (ref 1.7–7.7)
Neutrophils Relative %: 95 %
Platelets: <5 10*3/uL — CL (ref 150–400)
RBC: 2.21 MIL/uL — ABNORMAL LOW (ref 4.22–5.81)
RDW: 14.3 % (ref 11.5–15.5)
WBC: 26.5 10*3/uL — ABNORMAL HIGH (ref 4.0–10.5)
nRBC: 0 % (ref 0.0–0.2)

## 2022-11-06 LAB — CBC AND DIFFERENTIAL
HCT: 29 — AB (ref 41–53)
Hemoglobin: 9.7 — AB (ref 13.5–17.5)
Neutrophils Absolute: 19.5
Platelets: 4 10*3/uL — AB (ref 150–400)
WBC: 21.2

## 2022-11-06 LAB — BASIC METABOLIC PANEL
Anion gap: 8 (ref 5–15)
BUN: 70 mg/dL — ABNORMAL HIGH (ref 8–23)
CO2: 19 mmol/L — ABNORMAL LOW (ref 22–32)
Calcium: 7.5 mg/dL — ABNORMAL LOW (ref 8.9–10.3)
Chloride: 103 mmol/L (ref 98–111)
Creatinine, Ser: 0.96 mg/dL (ref 0.61–1.24)
GFR, Estimated: >60 mL/min (ref >60)
Glucose, Bld: 136 mg/dL — ABNORMAL HIGH (ref 70–99)
Potassium: 3.7 mmol/L (ref 3.5–5.1)
Sodium: 130 mmol/L — ABNORMAL LOW (ref 135–145)

## 2022-11-06 LAB — PREPARE PLATELET PHERESIS: Unit division: 0

## 2022-11-06 LAB — CBC: RBC: 3.03 — AB (ref 3.87–5.11)

## 2022-11-06 LAB — PROTIME-INR
INR: 1.2 (ref 0.8–1.2)
Prothrombin Time: 15.3 seconds — ABNORMAL HIGH (ref 11.4–15.2)

## 2022-11-06 LAB — FIBRINOGEN: Fibrinogen: 299 mg/dL (ref 210–475)

## 2022-11-06 MED ORDER — METHYLPREDNISOLONE SODIUM SUCC 125 MG IJ SOLR
125.0000 mg | Freq: Once | INTRAMUSCULAR | Status: AC
  Administered 2022-11-06: 125 mg via INTRAVENOUS
  Filled 2022-11-06: qty 2

## 2022-11-06 MED ORDER — IOHEXOL 350 MG/ML SOLN: 100.0000 mL | Freq: Once | INTRAVENOUS | Status: DC | PRN

## 2022-11-06 MED ORDER — SODIUM CHLORIDE 0.9% IV SOLUTION: Freq: Once | INTRAVENOUS | Status: AC

## 2022-11-06 MED ORDER — SODIUM CHLORIDE 0.9 % IV SOLN: Freq: Once | INTRAVENOUS | Status: AC

## 2022-11-06 MED ORDER — SODIUM CHLORIDE 0.9 % IV SOLN
375.0000 mg/m2 | Freq: Once | INTRAVENOUS | Status: AC
  Administered 2022-11-06: 800 mg via INTRAVENOUS
  Filled 2022-11-06: qty 50

## 2022-11-06 MED ORDER — FAMOTIDINE IN NACL 20-0.9 MG/50ML-% IV SOLN
40.0000 mg | Freq: Once | INTRAVENOUS | Status: AC
  Administered 2022-11-06: 40 mg via INTRAVENOUS
  Filled 2022-11-06: qty 100

## 2022-11-06 MED ORDER — SODIUM CHLORIDE 0.9 % IV SOLN: Freq: Once | INTRAVENOUS | Status: DC

## 2022-11-06 MED ORDER — ONDANSETRON HCL 4 MG/2ML IJ SOLN: 4.0000 mg | Freq: Once | INTRAMUSCULAR | Status: DC

## 2022-11-06 MED ORDER — IMMUNE GLOBULIN (HUMAN) 10 GM/100ML IV SOLN
1.0000 g/kg | INTRAVENOUS | Status: DC
  Administered 2022-11-06: 75 g via INTRAVENOUS
  Filled 2022-11-06 (×2): qty 750

## 2022-11-06 MED ORDER — IOHEXOL 350 MG/ML SOLN
100.0000 mL | Freq: Once | INTRAVENOUS | Status: AC | PRN
  Administered 2022-11-06: 100 mL via INTRAVENOUS

## 2022-11-06 MED ORDER — DIPHENHYDRAMINE HCL 50 MG/ML IJ SOLN
50.0000 mg | Freq: Once | INTRAMUSCULAR | Status: AC
  Administered 2022-11-06: 50 mg via INTRAVENOUS
  Filled 2022-11-06: qty 1

## 2022-11-06 NOTE — Telephone Encounter (Signed)
CRITICAL VALUE STICKER  CRITICAL VALUE: Platelet 4  RECEIVER (on-site recipient of call): Aileen Fass  DATE & TIME NOTIFIED: 11/05/2021 @ 0958  MESSENGER (representative from lab): Michelle @ [facility] lab ext [phone]  MD NOTIFIED: Dr Rennis Harding  TIME OF NOTIFICATION:1000  RESPONSE: Platelets ordered.

## 2022-11-06 NOTE — Progress Notes (Signed)
Pt d/c with IV intact with EMS

## 2022-11-06 NOTE — Progress Notes (Signed)
I was notified of this consult Chart is reviewed with emergency room physician I recommend IVIG 1g/kg x 2 days, platelet transfusions, coagulation studies and Ct imaging of the neck I will see him tomorrow

## 2022-11-06 NOTE — Progress Notes (Signed)
0900 pt stated he had been dizzy overnight with a lot of extra bleeding thru his trach and mouth. Pts wife had called EMS and they came to eval pt but he refused to go to ED because he wanted treatment today. Spoke with Dr. Melvyn Neth and he ordered 2L NS and a stat CBC. Pt and wife aware and agreed to treatment plan.  1028 spoke with dr Melvyn Neth and he agreed to do 120 solumedrol, Benadryl IV and 40 of pepcid IV, Brandy, Pharmacy to put orders in. Pt unable to swallow and did not bring his attachment to the Gtube for crushed meds. Ok to start rituxan after premeds and give additional liter of fluid if needed per Dr. Melvyn Neth.  1250 patient stood up to urinate and almost fell, wife at bedside BP dropped (see vitals) pt was dizzy, paused rituxan, restarted once BP stable (see MAR). 1500 Spoke with Dr. Melvyn Neth about concern for patient bleeding and low blood pressure. Wife is also concerned about taking him home and not being able to get him up if he falls. He states it is ok to run platelets at 999 after the first 15 mins when we start them, use pre meds from his treatment and then call 911 for transport to [place] ED. Patient and wife agrees to plan. [address] [employer] here to transport pt to [place] long wife at bedside.

## 2022-11-06 NOTE — Patient Instructions (Signed)
Rituximab Injection What is this medication? RITUXIMAB (ri TUX i mab) treats leukemia and lymphoma. It works by blocking a protein that causes cancer cells to grow and multiply. This helps to slow or stop the spread of cancer cells. It may also be used to treat autoimmune conditions, such as arthritis. It works by slowing down an overactive immune system. It is a monoclonal antibody. This medicine may be used for other purposes; ask your health care provider or pharmacist if you have questions. COMMON BRAND NAME(S): RIABNI, Rituxan, RUXIENCE, truxima What should I tell my care team before I take this medication? They need to know if you have any of these conditions: Chest pain Heart disease Immune system problems Infection, such as chickenpox, cold sores, hepatitis B, herpes Irregular heartbeat or rhythm Kidney disease Low blood counts, such as low white cells, platelets, red cells Lung disease Recent or upcoming vaccine An unusual or allergic reaction to rituximab, other medications, foods, dyes, or preservatives Pregnant or trying to get pregnant Breast-feeding How should I use this medication? This medication is injected into a vein. It is given by a care team in a hospital or clinic setting. A special MedGuide will be given to you before each treatment. Be sure to read this information carefully each time. Talk to your care team about the use of this medication in children. While this medication may be prescribed for children as young as 6 months for selected conditions, precautions do apply. Overdosage: If you think you have taken too much of this medicine contact a poison control center or emergency room at once. NOTE: This medicine is only for you. Do not share this medicine with others. What if I miss a dose? Keep appointments for follow-up doses. It is important not to miss your dose. Call your care team if you are unable to keep an appointment. What may interact with this  medication? Do not take this medication with any of the following: Live vaccines This medication may also interact with the following: Cisplatin This list may not describe all possible interactions. Give your health care provider a list of all the medicines, herbs, non-prescription drugs, or dietary supplements you use. Also tell them if you smoke, drink alcohol, or use illegal drugs. Some items may interact with your medicine. What should I watch for while using this medication? Your condition will be monitored carefully while you are receiving this medication. You may need blood work while taking this medication. This medication can cause serious infusion reactions. To reduce the risk your care team may give you other medications to take before receiving this one. Be sure to follow the directions from your care team. This medication may increase your risk of getting an infection. Call your care team for advice if you get a fever, chills, sore throat, or other symptoms of a cold or flu. Do not treat yourself. Try to avoid being around people who are sick. Call your care team if you are around anyone with measles, chickenpox, or if you develop sores or blisters that do not heal properly. Avoid taking medications that contain aspirin, acetaminophen, ibuprofen, naproxen, or ketoprofen unless instructed by your care team. These medications may hide a fever. This medication may cause serious skin reactions. They can happen weeks to months after starting the medication. Contact your care team right away if you notice fevers or flu-like symptoms with a rash. The rash may be red or purple and then turn into blisters or peeling of the skin.   You may also notice a red rash with swelling of the face, lips, or lymph nodes in your neck or under your arms. In some patients, this medication may cause a serious brain infection that may cause death. If you have any problems seeing, thinking, speaking, walking, or  standing, tell your care team right away. If you cannot reach your care team, urgently seek another source of medical care. Talk to your care team if you may be pregnant. Serious birth defects can occur if you take this medication during pregnancy and for 12 months after the last dose. You will need a negative pregnancy test before starting this medication. Contraception is recommended while taking this medication and for 12 months after the last dose. Your care team can help you find the option that works for you. Do not breastfeed while taking this medication and for at least 6 months after the last dose. What side effects may I notice from receiving this medication? Side effects that you should report to your care team as soon as possible: Allergic reactions or angioedema--skin rash, itching or hives, swelling of the face, eyes, lips, tongue, arms, or legs, trouble swallowing or breathing Bowel blockage--stomach cramping, unable to have a bowel movement or pass gas, loss of appetite, vomiting Dizziness, loss of balance or coordination, confusion or trouble speaking Heart attack--pain or tightness in the chest, shoulders, arms, or jaw, nausea, shortness of breath, cold or clammy skin, feeling faint or lightheaded Heart rhythm changes--fast or irregular heartbeat, dizziness, feeling faint or lightheaded, chest pain, trouble breathing Infection--fever, chills, cough, sore throat, wounds that don't heal, pain or trouble when passing urine, general feeling of discomfort or being unwell Infusion reactions--chest pain, shortness of breath or trouble breathing, feeling faint or lightheaded Kidney injury--decrease in the amount of urine, swelling of the ankles, hands, or feet Liver injury--right upper belly pain, loss of appetite, nausea, light-colored stool, dark yellow or brown urine, yellowing skin or eyes, unusual weakness or fatigue Redness, blistering, peeling, or loosening of the skin, including  inside the mouth Stomach pain that is severe, does not go away, or gets worse Tumor lysis syndrome (TLS)--nausea, vomiting, diarrhea, decrease in the amount of urine, dark urine, unusual weakness or fatigue, confusion, muscle pain or cramps, fast or irregular heartbeat, joint pain Side effects that usually do not require medical attention (report to your care team if they continue or are bothersome): Headache Joint pain Nausea Runny or stuffy nose Unusual weakness or fatigue This list may not describe all possible side effects. Call your doctor for medical advice about side effects. You may report side effects to FDA at 1-800-FDA-1088. Where should I keep my medication? This medication is given in a hospital or clinic. It will not be stored at home. NOTE: This sheet is a summary. It may not cover all possible information. If you have questions about this medicine, talk to your doctor, pharmacist, or health care provider.  2024 Elsevier/Gold Standard (2021-09-25 00:00:00)  

## 2022-11-06 NOTE — Progress Notes (Addendum)
Patient unable to swallow. Benadryl 50mg  IV, Pepcid 40mg  IV and SM 125mg  IV premedications entered per MD. NS 1L over 1 hours entered per MD request.  Richardean Sale, RPH, BCPS, BCOP 11/06/2022 10:26 AM

## 2022-11-06 NOTE — Progress Notes (Signed)
RT assessed patient's [place] Tube. Patient bleeding under Tube which is why he is here. Patient is on room air sating 95%. Patient is tolerating well at this time and is in No respiratory distress.

## 2022-11-06 NOTE — ED Triage Notes (Signed)
Patient BIB EMS from [facility]. Per report pt cough out a lot of blood from his trach and mouth at cancer center. Per report patient platelet at cancer center was <5 . 500 ML NS Bolus given by EMS. Pt denies N/V. Pt a/ox4. Patient hx throat CA  BP 100/60 HR77 RR 20 O2sat 98

## 2022-11-06 NOTE — ED Provider Notes (Signed)
[facility] AT [facility] Provider Note   CSN: [id number] Arrival date & time: 11/06/22  1801     History  Chief Complaint  Patient presents with   Hemoptysis    William Bell is a 72 y.o. male.  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  Last eval showed him to be tonsillar cancer free.  Pt went to his hematologist on 6/19 for hemoptysis and epistaxis.  He's been bleeding from his trach site.  He's had a lot of bruising.  He was given Nplate, decadron, and was given 2 units of plt on the 19th.  He came back today b/c of extra bleeding and he was found to have plt count of 4.  He was given rituxan and 2 more units of plt.  He was sent here from the [facility] b/c he fels dizzy and they were concerned he may need plts again tomorrow.  Pt feels ok.  Hgb down to 9.7 this morning from 12.9 yesterday.          Home Medications Prior to Admission medications   Medication Sig Start Date End Date Taking? Authorizing Provider  acetaminophen (TYLENOL) 500 MG tablet Take 1,000 mg by mouth every 6 (six) hours as needed for mild pain or headache.   Yes [provider]  atorvastatin (LIPITOR) 40 MG tablet Take 40 mg by mouth daily. 09/28/22  Yes [provider]  citalopram (CELEXA) 20 MG tablet Take 20 mg by mouth daily.   Yes [provider]  co-enzyme Q-10 30 MG capsule Take 30 mg by mouth daily.   Yes [provider]  dexamethasone (DECADRON) 4 MG tablet 10 pills po daily x 4 days Patient taking differently: Take 40 mg by mouth daily. 11/04/22  Yes Lewis, Dequincy A, MD  levothyroxine (SYNTHROID) 88 MCG tablet Take 88 mcg by mouth daily before breakfast.   Yes [provider]  lisinopril (ZESTRIL) 2.5 MG tablet Take 2.5 mg by mouth daily. 04/13/22  Yes [provider]   ALPRAZolam Prudy Feeler) 0.5 MG tablet Take 1 tablet (0.5 mg total) by mouth 3 (three) times daily as needed for anxiety. Patient not taking: Reported on 11/06/2022 06/20/20   Ilda Basset A, NP  EPINEPHrine 0.3 mg/0.3 mL IJ SOAJ injection Inject 0.3 mg into the muscle as needed for anaphylaxis. Patient not taking: Reported on 11/06/2022 06/22/20   Terald Sleeper, MD      Allergies    Patient has no known allergies.    Review of Systems   Review of Systems  HENT:  Positive for nosebleeds.        Bleeding from trach site  All other systems reviewed and are negative.   Physical Exam Updated Vital Signs BP (!) 153/76 (BP Location: Right Arm)   Pulse 90   Temp (!) 97.4 F (36.3 C) (Axillary)   Resp 18   Ht 5\' 8"  (1.727 m)   Wt 88 kg   SpO2 97%   BMI 29.50 kg/m  Physical Exam Vitals and nursing note reviewed.  Constitutional:      Appearance: Normal appearance.  HENT:     Head: Normocephalic and atraumatic.     Right Ear: External ear normal.     Left Ear: External ear normal.     Nose:     Comments: Dried blood in nose, active oozing blood  from trach site    Mouth/Throat:     Pharynx: Oropharynx is clear.  Eyes:     Extraocular Movements: Extraocular movements intact.     Conjunctiva/sclera: Conjunctivae normal.     Pupils: Pupils are equal, round, and reactive to light.  Cardiovascular:     Rate and Rhythm: Normal rate and regular rhythm.     Pulses: Normal pulses.     Heart sounds: Normal heart sounds.  Pulmonary:     Effort: Pulmonary effort is normal.     Breath sounds: Normal breath sounds.  Abdominal:     General: Abdomen is flat. Bowel sounds are normal.     Palpations: Abdomen is soft.  Musculoskeletal:        General: Normal range of motion.     Cervical back: Normal range of motion and neck supple.  Skin:    Capillary Refill: Capillary refill takes less than 2 seconds.     Findings: Bruising, ecchymosis and petechiae present.  Neurological:     General:  No focal deficit present.     Mental Status: He is alert and oriented to person, place, and time.  Psychiatric:        Mood and Affect: Mood normal.        Behavior: Behavior normal.     ED Results / Procedures / Treatments   Labs (all labs ordered are listed, but only abnormal results are displayed) Labs Reviewed  CBC WITH DIFFERENTIAL/PLATELET - Abnormal; Notable for the following components:      Result Value   WBC 26.5 (*)    RBC 2.21 (*)    Hemoglobin 7.1 (*)    HCT 21.7 (*)    Platelets <5 (*)    Neutro Abs 25.0 (*)    Lymphs Abs 0.4 (*)    Abs Immature Granulocytes 0.20 (*)    All other components within normal limits  BASIC METABOLIC PANEL - Abnormal; Notable for the following components:   Sodium 130 (*)    CO2 19 (*)    Glucose, Bld 136 (*)    BUN 70 (*)    Calcium 7.5 (*)    All other components within normal limits  PROTIME-INR - Abnormal; Notable for the following components:   Prothrombin Time 15.3 (*)    All other components within normal limits  HEPATIC FUNCTION PANEL - Abnormal; Notable for the following components:   Total Protein 5.3 (*)    Albumin 2.8 (*)    All other components within normal limits  FIBRINOGEN  PREPARE RBC (CROSSMATCH)    EKG None  Radiology CT Soft Tissue Neck W Contrast  Result Date: 11/06/2022 CLINICAL DATA:  Bleeding from tracheostomy site. History of tonsillar carcinoma. EXAM: CT NECK WITH CONTRAST TECHNIQUE: Multidetector CT imaging of the neck was performed using the standard protocol following the bolus administration of intravenous contrast. RADIATION DOSE REDUCTION: This exam was performed according to the departmental dose-optimization program which includes automated exposure control, adjustment of the mA and/or kV according to patient size and/or use of iterative reconstruction technique. CONTRAST:  OMNIPAQUE IOHEXOL 350 MG/ML SOLN COMPARISON:  09/28/2021, 10/29/2021 FINDINGS: Pharynx and larynx: There is diffuse  soft tissue abnormality throughout the pharynx, consistent with reported history of tonsillar carcinoma. The diffuse soft tissue thickening is likely a sequela of radiation treatment. The epiglottis is thickened. No discrete mass is visible, but the loss of normal tissue planes somewhat degrades the search. Salivary glands: Unremarkable parotid glands. Submandibular glands are absent. Thyroid: Normal  Lymph nodes: Bilateral neck dissections.  No lymphadenopathy. Vascular: Calcific aortic atherosclerosis. Bilateral carotid bifurcation atherosclerosis. Limited intracranial: Normal Visualized orbits: Normal Mastoids and visualized paranasal sinuses: Clear. Skeleton: Partial resection of the mandible. Upper chest: No focal abnormality at the tracheostomy site. Other: None IMPRESSION: 1. Diffuse soft tissue abnormality throughout the pharynx, consistent with reported history of tonsillar carcinoma and likely due to prior radiation treatment. No discrete mass is visible, but the loss of normal tissue planes somewhat degrades the assessment. 2. Unremarkable appearance of the tracheostomy. No active hemorrhage or other visible abnormality. 3. Bilateral neck dissections. No lymphadenopathy. Aortic Atherosclerosis (ICD10-I70.0). Electronically Signed   By: Deatra Robinson M.D.   On: 11/06/2022 22:25   CT Angio Chest PE W and/or Wo Contrast  Result Date: 11/06/2022 CLINICAL DATA:  Patient coughed up blood. Low platelet count with oozing at the trach site. EXAM: CT ANGIOGRAPHY CHEST WITH CONTRAST TECHNIQUE: Multidetector CT imaging of the chest was performed using the standard protocol during bolus administration of intravenous contrast. Multiplanar CT image reconstructions and MIPs were obtained to evaluate the vascular anatomy. RADIATION DOSE REDUCTION: This exam was performed according to the departmental dose-optimization program which includes automated exposure control, adjustment of the mA and/or kV according to patient  size and/or use of iterative reconstruction technique. CONTRAST:  OMNIPAQUE IOHEXOL 350 MG/ML SOLN COMPARISON:  Chest radiograph 09/29/2021 and PET/CT 03/07/2021 FINDINGS: Cardiovascular: Satisfactory opacification of the central and lobar pulmonary arteries. The segmental and subsegmental branches in the lower lobes are partially obscured by motion artifact. Nonocclusive filling defects in a right lower lobe pulmonary artery on series 10/image 184 and 6/92 may be artifactual given motion in this area though pulmonary embolism is difficult to exclude. No pericardial effusion. Coronary artery and aortic atherosclerotic calcification. Mediastinum/Nodes: Tracheostomy tube tip in the upper trachea. Esophagus is unremarkable. No thoracic adenopathy by size. Lungs/Pleura: Emphysema. Patchy ground-glass and consolidative opacities greatest in the left lower lobe. There is occlusion of the bilateral lower lobe bronchi with air-fluid level on the left (series 10/image 163). No pleural effusion or pneumothorax. Upper Abdomen: Cholelithiasis.  No acute abnormality Musculoskeletal: No acute fracture or destructive osseous lesion. Review of the MIP images confirms the above findings. IMPRESSION: 1. Patchy ground-glass and consolidative opacities greatest in the left lower lobe. Occlusion of the bilateral lower lobe bronchi with air-fluid level on the left. Given history of oozing at the trach site this may be due to aspiration of blood. Differential consideration is aspiration with multifocal pneumonia. 2. Nonocclusive filling defects in a right lower lobe pulmonary artery may be due to small subsegmental emboli or artifactual given motion in this area. Aortic Atherosclerosis (ICD10-I70.0). Critical Value/emergent results were called by telephone at the time of interpretation on 11/06/2022 at 10:22 pm to provider [facility] , who verbally acknowledged these results. Electronically Signed   By: Minerva Fester M.D.   On:  11/06/2022 22:24    Procedures Procedures    Medications Ordered in ED Medications  Immune Globulin 10% (PRIVIGEN) IV infusion 75 g (has no administration in time range)  0.9 %  sodium chloride infusion (Manually program via Guardrails IV Fluids) (has no administration in time range)  iohexol (OMNIPAQUE) 350 MG/ML injection 100 mL (has no administration in time range)  ondansetron (ZOFRAN) injection 4 mg (has no administration in time range)  iohexol (OMNIPAQUE) 350 MG/ML injection 100 mL (100 mLs Intravenous Contrast Given 11/06/22 2146)    ED Course/ Medical Decision Making/ A&P  Medical Decision Making Amount and/or Complexity of Data Reviewed Labs: ordered. Radiology: ordered.  Risk Prescription drug management. Decision regarding hospitalization.   This patient presents to the ED for concern of thrombocytopenia, this involves an extensive number of treatment options, and is a complaint that carries with it a high risk of complications and morbidity.  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  The pertinent results include:  cbc with wbc elevated at 26.5, hgb 7.1, and plt less than 5 (hgb 12.5 yesterday and 9.5 this am); bmp with bun 70; fibrinogen 299, inr 1.2, lfts nl other than albumin low at 2.8   Imaging Studies ordered:  I ordered imaging studies including ct neck and ct chest I independently visualized and interpreted imaging which showed  CT neck:  Diffuse soft tissue abnormality throughout the pharynx,  consistent with reported  history of tonsillar carcinoma and likely  due to prior radiation treatment. No discrete mass is visible, but  the loss of normal tissue planes somewhat degrades the assessment.  2. Unremarkable appearance of the tracheostomy. No active hemorrhage  or other visible abnormality.  3. Bilateral neck dissections. No lymphadenopathy.    Aortic Atherosclerosis (ICD10-I70.0).  CT chest:  Patchy ground-glass and consolidative opacities greatest in the  left lower lobe. Occlusion of the bilateral lower lobe bronchi with  air-fluid level on the left. Given history of oozing at the trach  site this may be due to aspiration of blood. Differential  consideration is aspiration with multifocal pneumonia.  2. Nonocclusive filling defects in a right lower lobe pulmonary  artery may be due to small subsegmental emboli or artifactual given  motion in this area.    Aortic Atherosclerosis (ICD10-I70.0).   I agree with the radiologist interpretation   Cardiac Monitoring:  The patient was maintained on a cardiac monitor.  I personally viewed and interpreted the cardiac monitored which showed an underlying rhythm of: nsr   Medicines ordered and prescription drug management:  I ordered medication including ivig  for itp and blood for anemia  Reevaluation of the patient after these medicines showed that the patient improved I have reviewed the patients home medicines and have made adjustments as needed   Test Considered:  Ct neck/chest   Critical Interventions:  ivig   Consultations Obtained:  I requested consultation with the oncologist (Dr. Bertis Ruddy),  and discussed lab and imaging findings as well as pertinent plan - she recommends ivig and ct neck.  She also requests inr, fibrinogen, and lfts.  She will see pt in consult in am.   Pt d/w Dr. Julian Reil ([facility]) for admission.  Problem List / ED Course:  ITP:  IVIG started in ED.  Pt has had plt, rituxan, and steroids today.  Dr. Bertis Ruddy  consulted. Symptomatic anemia:  hgb was 12.5 yesterday and is down to 7.1 this evening.  2 units prbcs ordered for transfusion.      Reevaluation:  After the interventions noted above, I reevaluated the patient and found that they have :improved   Social Determinants of Health:  Lives at home   Dispostion:  After consideration of the diagnostic results and the patients response to treatment,  I feel that the patent would benefit from admission.    CRITICAL CARE Performed by: Jacalyn Lefevre   Total critical care time: 45 minutes  Critical care time was exclusive of separately billable procedures and treating other patients.  Critical care was necessary to treat or prevent imminent or life-threatening deterioration.  Critical care was time spent personally by me on the following activities: development of treatment plan with patient and/or surrogate as well as nursing, discussions with consultants, evaluation of patient's response to treatment, examination of patient, obtaining history from patient or surrogate, ordering and performing treatments and interventions, ordering and review of laboratory studies, ordering and review of radiographic studies, pulse oximetry and re-evaluation of patient's condition.         Final Clinical Impression(s) / ED Diagnoses Final diagnoses:  Symptomatic anemia  Idiopathic thrombocytopenic purpura (ITP) (HCC)  Thrombocytopenia (HCC)    Rx / DC Orders ED Discharge Orders     None         Jacalyn Lefevre, MD 11/06/22 2310

## 2022-11-07 ENCOUNTER — Encounter (HOSPITAL_COMMUNITY): Payer: Self-pay | Admitting: Internal Medicine

## 2022-11-07 DIAGNOSIS — J9501 Hemorrhage from tracheostomy stoma: Secondary | ICD-10-CM | POA: Diagnosis present

## 2022-11-07 DIAGNOSIS — D693 Immune thrombocytopenic purpura: Secondary | ICD-10-CM

## 2022-11-07 DIAGNOSIS — C090 Malignant neoplasm of tonsillar fossa: Secondary | ICD-10-CM

## 2022-11-07 DIAGNOSIS — D62 Acute posthemorrhagic anemia: Secondary | ICD-10-CM | POA: Diagnosis present

## 2022-11-07 HISTORY — DX: Hemorrhage from tracheostomy stoma: J95.01

## 2022-11-07 HISTORY — DX: Acute posthemorrhagic anemia: D62

## 2022-11-07 LAB — CBC WITH DIFFERENTIAL/PLATELET
Abs Immature Granulocytes: 0.09 10*3/uL — ABNORMAL HIGH (ref 0.00–0.07)
Basophils Absolute: 0 10*3/uL (ref 0.0–0.1)
Basophils Relative: 0 %
Eosinophils Absolute: 0 10*3/uL (ref 0.0–0.5)
Eosinophils Relative: 0 %
HCT: 21.5 % — ABNORMAL LOW (ref 39.0–52.0)
Hemoglobin: 7.1 g/dL — ABNORMAL LOW (ref 13.0–17.0)
Immature Granulocytes: 1 %
Lymphocytes Relative: 2 %
Lymphs Abs: 0.3 10*3/uL — ABNORMAL LOW (ref 0.7–4.0)
MCH: 32.3 pg (ref 26.0–34.0)
MCHC: 33 g/dL (ref 30.0–36.0)
MCV: 97.7 fL (ref 80.0–100.0)
Monocytes Absolute: 0.4 10*3/uL (ref 0.1–1.0)
Monocytes Relative: 3 %
Neutro Abs: 13.8 10*3/uL — ABNORMAL HIGH (ref 1.7–7.7)
Neutrophils Relative %: 94 %
Platelets: <5 10*3/uL — CL (ref 150–400)
RBC: 2.2 MIL/uL — ABNORMAL LOW (ref 4.22–5.81)
RDW: 15.3 % (ref 11.5–15.5)
WBC: 14.6 10*3/uL — ABNORMAL HIGH (ref 4.0–10.5)
nRBC: 0.1 % (ref 0.0–0.2)

## 2022-11-07 LAB — CBC
HCT: 25.3 % — ABNORMAL LOW (ref 39.0–52.0)
Hemoglobin: 8.5 g/dL — ABNORMAL LOW (ref 13.0–17.0)
MCH: 31.7 pg (ref 26.0–34.0)
MCHC: 33.6 g/dL (ref 30.0–36.0)
MCV: 94.4 fL (ref 80.0–100.0)
Platelets: <5 10*3/uL — CL (ref 150–400)
RBC: 2.68 MIL/uL — ABNORMAL LOW (ref 4.22–5.81)
RDW: 14.6 % (ref 11.5–15.5)
WBC: 20.1 10*3/uL — ABNORMAL HIGH (ref 4.0–10.5)
nRBC: 0 % (ref 0.0–0.2)

## 2022-11-07 LAB — BPAM PLATELET PHERESIS
Blood Product Expiration Date: 202406242359
ISSUE DATE / TIME: 202406220125
ISSUE DATE / TIME: 202406221032
ISSUE DATE / TIME: 202406221246
Unit Type and Rh: 5100

## 2022-11-07 LAB — PREPARE PLATELET PHERESIS
Unit division: 0
Unit division: 0

## 2022-11-07 LAB — TYPE AND SCREEN
ABO/RH(D): A POS
Unit division: 0
Unit division: 0

## 2022-11-07 LAB — BPAM RBC
Blood Product Expiration Date: 202407112359
Unit Type and Rh: 6200
Unit Type and Rh: 6200

## 2022-11-07 LAB — BASIC METABOLIC PANEL
Anion gap: 9 (ref 5–15)
BUN: 57 mg/dL — ABNORMAL HIGH (ref 8–23)
CO2: 21 mmol/L — ABNORMAL LOW (ref 22–32)
Calcium: 7.6 mg/dL — ABNORMAL LOW (ref 8.9–10.3)
Chloride: 103 mmol/L (ref 98–111)
Creatinine, Ser: 0.94 mg/dL (ref 0.61–1.24)
GFR, Estimated: >60 mL/min (ref >60)
Glucose, Bld: 177 mg/dL — ABNORMAL HIGH (ref 70–99)
Potassium: 3.9 mmol/L (ref 3.5–5.1)
Sodium: 133 mmol/L — ABNORMAL LOW (ref 135–145)

## 2022-11-07 LAB — PREPARE RBC (CROSSMATCH)

## 2022-11-07 LAB — MAGNESIUM: Magnesium: 1.9 mg/dL (ref 1.7–2.4)

## 2022-11-07 LAB — MRSA NEXT GEN BY PCR, NASAL: MRSA by PCR Next Gen: NOT DETECTED

## 2022-11-07 MED ORDER — SODIUM CHLORIDE 0.9% IV SOLUTION: Freq: Once | INTRAVENOUS | Status: AC

## 2022-11-07 MED ORDER — JEVITY 1.5 CAL/FIBER PO LIQD: 237.0000 mL | Freq: Every day | ORAL | 0 refills | Status: DC

## 2022-11-07 MED ORDER — DEXAMETHASONE 20 MG PO TABS: 40.0000 mg | ORAL_TABLET | Freq: Every day | ORAL | 0 refills | Status: DC

## 2022-11-07 MED ORDER — TRANEXAMIC ACID-NACL 1000-0.7 MG/100ML-% IV SOLN
1000.0000 mg | Freq: Once | INTRAVENOUS | Status: AC
  Administered 2022-11-07: 1000 mg via INTRAVENOUS
  Filled 2022-11-07: qty 100

## 2022-11-07 MED ORDER — ATORVASTATIN CALCIUM 40 MG PO TABS
40.0000 mg | ORAL_TABLET | Freq: Every day | ORAL | Status: DC
  Administered 2022-11-07: 40 mg via ORAL
  Filled 2022-11-07: qty 1

## 2022-11-07 MED ORDER — TRANEXAMIC ACID-NACL 1000-0.7 MG/100ML-% IV SOLN: 1000.0000 mg | Freq: Once | INTRAVENOUS | Status: DC

## 2022-11-07 MED ORDER — DEXAMETHASONE 6 MG PO TABS
40.0000 mg | ORAL_TABLET | Freq: Every day | ORAL | Status: DC
  Administered 2022-11-07: 40 mg via ORAL
  Filled 2022-11-07: qty 2

## 2022-11-07 MED ORDER — ACETAMINOPHEN 650 MG RE SUPP: 650.0000 mg | Freq: Four times a day (QID) | RECTAL | Status: DC | PRN

## 2022-11-07 MED ORDER — LEVOTHYROXINE SODIUM 88 MCG PO TABS: 88.0000 ug | ORAL_TABLET | Freq: Every day | ORAL | Status: DC

## 2022-11-07 MED ORDER — ONDANSETRON HCL 4 MG/2ML IJ SOLN: 4.0000 mg | Freq: Four times a day (QID) | INTRAMUSCULAR | Status: DC | PRN

## 2022-11-07 MED ORDER — DIPHENHYDRAMINE HCL 50 MG/ML IJ SOLN
25.0000 mg | Freq: Once | INTRAMUSCULAR | Status: AC
  Administered 2022-11-07: 25 mg via INTRAVENOUS
  Filled 2022-11-07: qty 1

## 2022-11-07 MED ORDER — AMINOCAPROIC ACID SOLUTION 5% (50 MG/ML)
10.0000 mL | ORAL | Status: DC
  Administered 2022-11-07 (×11): 10 mL via ORAL
  Filled 2022-11-07 (×4): qty 100

## 2022-11-07 MED ORDER — CITALOPRAM HYDROBROMIDE 20 MG PO TABS
20.0000 mg | ORAL_TABLET | Freq: Every day | ORAL | Status: DC
  Administered 2022-11-07: 20 mg
  Filled 2022-11-07: qty 1

## 2022-11-07 MED ORDER — DEXAMETHASONE 6 MG PO TABS: 40.0000 mg | ORAL_TABLET | Freq: Every day | ORAL | Status: DC

## 2022-11-07 MED ORDER — LEVOTHYROXINE SODIUM 75 MCG PO TABS
75.0000 ug | ORAL_TABLET | Freq: Every day | ORAL | Status: DC
  Administered 2022-11-07: 75 ug
  Filled 2022-11-07: qty 1

## 2022-11-07 MED ORDER — HYDRALAZINE HCL 10 MG PO TABS
10.0000 mg | ORAL_TABLET | Freq: Four times a day (QID) | ORAL | Status: DC | PRN
  Administered 2022-11-07: 10 mg
  Filled 2022-11-07: qty 1

## 2022-11-07 MED ORDER — SODIUM CHLORIDE 0.9% IV SOLUTION: Freq: Once | INTRAVENOUS | Status: DC

## 2022-11-07 MED ORDER — ACETAMINOPHEN 325 MG PO TABS: 650.0000 mg | ORAL_TABLET | Freq: Four times a day (QID) | ORAL | Status: DC | PRN

## 2022-11-07 MED ORDER — CHLORHEXIDINE GLUCONATE CLOTH 2 % EX PADS
6.0000 | MEDICATED_PAD | Freq: Every day | CUTANEOUS | Status: DC
  Administered 2022-11-07 (×2): 6 via TOPICAL

## 2022-11-07 MED ORDER — FREE WATER
100.0000 mL | Freq: Every day | Status: DC
  Administered 2022-11-07 (×3): 100 mL

## 2022-11-07 MED ORDER — JEVITY 1.5 CAL/FIBER PO LIQD
237.0000 mL | Freq: Every day | ORAL | Status: DC
  Administered 2022-11-07 (×3): 237 mL
  Filled 2022-11-07 (×6): qty 237

## 2022-11-07 MED ORDER — LEVOTHYROXINE SODIUM 75 MCG PO TABS: 75.0000 ug | ORAL_TABLET | Freq: Every day | ORAL | Status: DC

## 2022-11-07 MED ORDER — ONDANSETRON HCL 4 MG PO TABS: 4.0000 mg | ORAL_TABLET | Freq: Four times a day (QID) | ORAL | Status: DC | PRN

## 2022-11-07 MED ORDER — MAGNESIUM SULFATE 2 GM/50ML IV SOLN
2.0000 g | Freq: Once | INTRAVENOUS | Status: AC
  Administered 2022-11-07: 2 g via INTRAVENOUS
  Filled 2022-11-07: qty 50

## 2022-11-07 MED ORDER — FUROSEMIDE 10 MG/ML IJ SOLN: 40.0000 mg | Freq: Once | INTRAMUSCULAR | Status: AC

## 2022-11-07 MED ORDER — OXIDIZED CELLULOSE EX PADS
1.0000 | MEDICATED_PAD | Freq: Once | CUTANEOUS | Status: DC
  Filled 2022-11-07: qty 1

## 2022-11-07 MED ORDER — TRANEXAMIC ACID 1000 MG/10ML IV SOLN: 1000.0000 mg | Freq: Once | INTRAVENOUS | Status: DC

## 2022-11-07 MED ORDER — ATORVASTATIN CALCIUM 40 MG PO TABS: 40.0000 mg | ORAL_TABLET | Freq: Every day | ORAL | Status: DC

## 2022-11-07 MED ORDER — FREE WATER
100.0000 mL | Freq: Three times a day (TID) | Status: DC
  Administered 2022-11-07: 100 mL

## 2022-11-07 MED ORDER — FUROSEMIDE 10 MG/ML IJ SOLN
INTRAMUSCULAR | Status: AC
  Administered 2022-11-07: 40 mg via INTRAVENOUS
  Filled 2022-11-07: qty 4

## 2022-11-07 MED ORDER — IMMUNE GLOBULIN (HUMAN) 10 GM/100ML IV SOLN: 1.0000 g/kg | INTRAVENOUS | 1 refills | Status: DC

## 2022-11-07 MED ORDER — LISINOPRIL 2.5 MG PO TABS
2.5000 mg | ORAL_TABLET | Freq: Every day | ORAL | Status: DC
  Administered 2022-11-07: 2.5 mg
  Filled 2022-11-07: qty 1

## 2022-11-07 NOTE — Progress Notes (Addendum)
       Overnight   NAME: William Bell MRN: [id number] DOB : [birth year]    Date of Service   11/07/2022   HPI/Events of Note    Notified by RN/RT due to concern for continued bleeding from trach ans concern for patency of airway.   He sees, Dr Melvyn Neth at [facility]. He was noted to have bruising and bleeding at that visit (see note of June 19 th)   This is a 72 year old male with medical history of ITP, hypertension, hypothyroidism, squamous cell carcinoma right tonsillar s/p radiation and trach.  Last eval showed him to be tonsillar cancer free. Patient was seen by his hematologist on 11/04/2022 for hemarthrosis and epistaxis. He has been bleeding from his trach site. He was given Nplate, Decadron, 2 units of platelets on the 19th.  Admitted through the ER today because of bleeding was found to have platelet count of 4.  On bedside visit, patient expelled multiple moderate size clots (1 cm x 2 cm) and accompanying bright red blood.  He is able to maintain his airway and expel the clots readily. He is able to communicate easily.    There is bleeding from the mouth and a regular trickle from the stoma.    Please see notation by Dr Marchelle Gearing 11/07/22 0201 hrs.  Attending (Dr. Julian Reil) has made notification to Dr Jenne Pane ENT, who will be coming in to assess and potentially pack area/ treat other wise .  Supplies are available in room in anticipation of any procedure..    Interventions/ Plan   Txa ordered as recommended by CCM.  Platelets as previously ordered are infusing. Preparation for arrival of ENT in consideration of any procedures.      Chinita Greenland BSN MSNA MSN ACNPC-AG Acute Care Nurse Practitioner [facility] .

## 2022-11-07 NOTE — Progress Notes (Addendum)
  Call fro [facility] MD Dr Lyda Perone for informal opiniion S/p trach at [facility] by Dr Christoper Allegra Now ITP  plat < 5 and oozing blood via trach On Rx by heme  Recommended  - consider thrombipad locally  -consider TXA after ensuring no contraindications  - recommended based on situation to call ENT to see if local epi or sutures can help - keep airway patent  CCM avail if distress and needs ET tube Informed elink Dr Warrick Parisian to monitor     SIGNATURE    Dr. Kalman Shan, M.D., F.C.C.P,  Pulmonary and Critical Care Medicine Staff Physician, [facility] Director - Interstitial Lung Disease  Program  Pulmonary Fibrosis [facility] at [facility] [place], [place], [address]   Pager: [phone], If no answer  -> Check AMION or Try [phone] Telephone (clinical office): [phone] Telephone (research): [phone]  1:58 AM 11/07/2022

## 2022-11-07 NOTE — Progress Notes (Signed)
RT placed patient on 5L 28%. Patient sats went up to 96%. Patient was coughing up bright red blood with some small clots. Patient is now in the ICU

## 2022-11-07 NOTE — Procedures (Signed)
Preop diagnosis: Hemoptysis, history of oropharyngeal cancer, thrombocytopenia Postop diagnosis: same Procedure: Transnasal fiberoptic laryngoscopy Surgeon: Jenne Pane Anesth: Topical with 4% lidocaine Compl: None Findings: There is no source of bleeding seen in the tracheostoma, trachea, or nasal passages.  View of the pharynx is limited by anatomical collapse and blood. Description:  After discussing risks, benefits, and alternatives, the patient was placed in a seated position and the right nasal passage was sprayed with topical anesthetic.  The fiberoptic scope was first passed through the lary tube to view the trachea down to the primary bronchi.  The lary tube was then removed and the scope used to evaluate the stoma.  The lary tube was replaced.  The scope was then passed through the right nasal passage to view the pharynx and was then removed.  Findings are noted above.  He was returned to nursing care in stable condition.  His lary tube was exchanged for a #6 cuffed Shiley trach tube and the cuff was inflated.

## 2022-11-07 NOTE — Progress Notes (Signed)
William Bell   DOB:18-Apr-1951   DV#:[id number]    ASSESSMENT & PLAN:  Severe, recurrent ITP I have recommended IVIG 1 g/kg daily for 2 days Continue dexamethasone high-dose at 40 mg for 2 more days Transfuse 2 more units today Coag studies are normal Fibrinogen is normal CT imaging showed diffuse radiation changes I recommend against using suction device on a regular basis that can induce more mucosal injury I will start him on Amicar swish and spit  Prior history of head and neck cancer, active bleeding He was seen by Dr. Jenne Pane overnight Recommend consideration of transfer to [facility] for further evaluation by ENT service as recommended by Dr. Jenne Pane  Anemia secondary to blood loss Recommend blood transfusion support due to active bleeding  Discharge planning He will remain in the ICU until his bleeding stops  All questions were answered. The patient knows to call the clinic with any problems, questions or concerns.   The total time spent in the appointment was 60 minutes encounter with patients including review of chart and various tests results, discussions about plan of care and coordination of care plan  Artis Delay, MD 11/07/2022 9:29 AM  Subjective:  The patient was transferred from [facility] for management of severe ITP with active bleeding.  He sees Dr. Melvyn Neth.  Dr. Melvyn Neth has treated him in the past with treatment including splenectomy  With recurrent ITP, he was started on Nplate and rituximab yesterday prior to transfer He had received numerous transfusion without success  At the time of evaluation, the patient is actively using his suction device His bed is soaked with bleeding from his mouth as well as from the tracheostomy site  Objective:  Vitals:   11/07/22 0735 11/07/22 0812  BP:    Pulse:  82  Resp:  17  Temp: 97.9 F (36.6 C)   SpO2:  96%     Intake/Output Summary (Last 24 hours) at 11/07/2022 0626 Last data filed at 11/07/2022 9485 Gross per 24  hour  Intake 1536.16 ml  Output 625 ml  Net 911.16 ml    GENERAL:alert, no distress and comfortable SKIN: Diffuse ecchymosis on noted EYES: normal, Conjunctiva are pink and non-injected, sclera clear OROPHARYNX: He is using his suction device nonstop with active bleeding noted NECK: Tracheostomy appears to be bleeding NEURO: alert & oriented x 3 with fluent speech, no focal motor/sensory deficits   Labs:  Recent Labs    11/06/22 1900  NA 130*  K 3.7  CL 103  CO2 19*  GLUCOSE 136*  BUN 70*  CREATININE 0.96  CALCIUM 7.5*  GFRNONAA >60  PROT 5.3*  ALBUMIN 2.8*  AST 27  ALT 16  ALKPHOS 45  BILITOT 0.6  BILIDIR 0.2  IBILI 0.4    Studies: I personally reviewed his imaging study CT Soft Tissue Neck W Contrast  Result Date: 11/06/2022 CLINICAL DATA:  Bleeding from tracheostomy site. History of tonsillar carcinoma. EXAM: CT NECK WITH CONTRAST TECHNIQUE: Multidetector CT imaging of the neck was performed using the standard protocol following the bolus administration of intravenous contrast. RADIATION DOSE REDUCTION: This exam was performed according to the departmental dose-optimization program which includes automated exposure control, adjustment of the mA and/or kV according to patient size and/or use of iterative reconstruction technique. CONTRAST:  OMNIPAQUE IOHEXOL 350 MG/ML SOLN COMPARISON:  09/28/2021, 10/29/2021 FINDINGS: Pharynx and larynx: There is diffuse soft tissue abnormality throughout the pharynx, consistent with reported history of tonsillar carcinoma. The diffuse soft tissue thickening is  likely a sequela of radiation treatment. The epiglottis is thickened. No discrete mass is visible, but the loss of normal tissue planes somewhat degrades the search. Salivary glands: Unremarkable parotid glands. Submandibular glands are absent. Thyroid: Normal Lymph nodes: Bilateral neck dissections.  No lymphadenopathy. Vascular: Calcific aortic atherosclerosis. Bilateral  carotid bifurcation atherosclerosis. Limited intracranial: Normal Visualized orbits: Normal Mastoids and visualized paranasal sinuses: Clear. Skeleton: Partial resection of the mandible. Upper chest: No focal abnormality at the tracheostomy site. Other: None IMPRESSION: 1. Diffuse soft tissue abnormality throughout the pharynx, consistent with reported history of tonsillar carcinoma and likely due to prior radiation treatment. No discrete mass is visible, but the loss of normal tissue planes somewhat degrades the assessment. 2. Unremarkable appearance of the tracheostomy. No active hemorrhage or other visible abnormality. 3. Bilateral neck dissections. No lymphadenopathy. Aortic Atherosclerosis (ICD10-I70.0). Electronically Signed   By: Deatra Robinson M.D.   On: 11/06/2022 22:25   CT Angio Chest PE W and/or Wo Contrast  Result Date: 11/06/2022 CLINICAL DATA:  Patient coughed up blood. Low platelet count with oozing at the trach site. EXAM: CT ANGIOGRAPHY CHEST WITH CONTRAST TECHNIQUE: Multidetector CT imaging of the chest was performed using the standard protocol during bolus administration of intravenous contrast. Multiplanar CT image reconstructions and MIPs were obtained to evaluate the vascular anatomy. RADIATION DOSE REDUCTION: This exam was performed according to the departmental dose-optimization program which includes automated exposure control, adjustment of the mA and/or kV according to patient size and/or use of iterative reconstruction technique. CONTRAST:  OMNIPAQUE IOHEXOL 350 MG/ML SOLN COMPARISON:  Chest radiograph 09/29/2021 and PET/CT 03/07/2021 FINDINGS: Cardiovascular: Satisfactory opacification of the central and lobar pulmonary arteries. The segmental and subsegmental branches in the lower lobes are partially obscured by motion artifact. Nonocclusive filling defects in a right lower lobe pulmonary artery on series 10/image 184 and 6/92 may be artifactual given motion in this area  though pulmonary embolism is difficult to exclude. No pericardial effusion. Coronary artery and aortic atherosclerotic calcification. Mediastinum/Nodes: Tracheostomy tube tip in the upper trachea. Esophagus is unremarkable. No thoracic adenopathy by size. Lungs/Pleura: Emphysema. Patchy ground-glass and consolidative opacities greatest in the left lower lobe. There is occlusion of the bilateral lower lobe bronchi with air-fluid level on the left (series 10/image 163). No pleural effusion or pneumothorax. Upper Abdomen: Cholelithiasis.  No acute abnormality Musculoskeletal: No acute fracture or destructive osseous lesion. Review of the MIP images confirms the above findings. IMPRESSION: 1. Patchy ground-glass and consolidative opacities greatest in the left lower lobe. Occlusion of the bilateral lower lobe bronchi with air-fluid level on the left. Given history of oozing at the trach site this may be due to aspiration of blood. Differential consideration is aspiration with multifocal pneumonia. 2. Nonocclusive filling defects in a right lower lobe pulmonary artery may be due to small subsegmental emboli or artifactual given motion in this area. Aortic Atherosclerosis (ICD10-I70.0). Critical Value/emergent results were called by telephone at the time of interpretation on 11/06/2022 at 10:22 pm to provider [facility] , who verbally acknowledged these results. Electronically Signed   By: Minerva Fester M.D.   On: 11/06/2022 22:24

## 2022-11-07 NOTE — Progress Notes (Signed)
After scope: bleeding source seems to be from back of throat somewhere, not from trach nor from nose.  Pt now has cuffed trach.  If bleeding doesn't stop, then the "hail mary" would be to pack patients oropharynx with gauze.  Dr. Jenne Pane asks that we start process of seeing about getting him transferred to [facility] since that's where his ENT team is located.  Ill give their transfer center a call.

## 2022-11-07 NOTE — ED Notes (Signed)
ED TO INPATIENT HANDOFF REPORT  Name/Age/Gender William Bell 72 y.o. male  Code Status   Home/SNF/Other Home  Chief Complaint Acute ITP (HCC) [D69.3]  Level of Care/Admitting Diagnosis ED Disposition     ED Disposition  Admit   Condition  --   Comment  Hospital Area: [facility] [[id number]]  Level of Care: ICU [6]  May admit patient to [facility] or [facility] if equivalent level of care is available:: Yes  Covid Evaluation: Asymptomatic - no recent exposure (last 10 days) testing not required  Diagnosis: Acute ITP [facility]) [[id number]]  Admitting Physician: Wyvonnia Dusky  Attending Physician: Hillary Bow [phone]  Certification:: I certify this patient will need inpatient services for at least 2 midnights  Estimated Length of Stay: 10          Medical History History reviewed. No pertinent past medical history.  Allergies No Known Allergies  IV Location/Drains/Wounds Patient Lines/Drains/Airways Status     Active Line/Drains/Airways     Name Placement date Placement time Site Days   Peripheral IV 11/06/22 22 G Posterior;Right Hand 11/06/22  0850  Hand  1   Peripheral IV 11/06/22 20 G 1.88" Anterior;Left;Upper Arm 11/06/22  2043  Arm  1            Labs/Imaging Results for orders placed or performed during the hospital encounter of 11/06/22 (from the past 48 hour(s))  CBC with Differential     Status: Abnormal   Collection Time: 11/06/22  7:00 PM  Result Value Ref Range   WBC 26.5 (H) 4.0 - 10.5 K/uL   RBC 2.21 (L) 4.22 - 5.81 MIL/uL   Hemoglobin 7.1 (L) 13.0 - 17.0 g/dL   HCT 95.2 (L) 84.1 - 32.4 %   MCV 98.2 80.0 - 100.0 fL   MCH 32.1 26.0 - 34.0 pg   MCHC 32.7 30.0 - 36.0 g/dL   RDW 40.1 02.7 - 25.3 %   Platelets <5 (LL) 150 - 400 K/uL    Comment: SPECIMEN CHECKED FOR CLOTS REPEATED TO VERIFY THIS CRITICAL RESULT HAS VERIFIED AND BEEN CALLED TO J ARMONDO, RN BY REGINA MANGUM ON 06 21 2024 AT 1943, AND HAS  BEEN READ BACK.     nRBC 0.0 0.0 - 0.2 %   Neutrophils Relative % 95 %   Neutro Abs 25.0 (H) 1.7 - 7.7 K/uL   Lymphocytes Relative 1 %   Lymphs Abs 0.4 (L) 0.7 - 4.0 K/uL   Monocytes Relative 3 %   Monocytes Absolute 0.9 0.1 - 1.0 K/uL   Eosinophils Relative 0 %   Eosinophils Absolute 0.0 0.0 - 0.5 K/uL   Basophils Relative 0 %   Basophils Absolute 0.0 0.0 - 0.1 K/uL   Immature Granulocytes 1 %   Abs Immature Granulocytes 0.20 (H) 0.00 - 0.07 K/uL    Comment: Performed at [facility], [address] [place], [place] [address]  Basic metabolic panel     Status: Abnormal   Collection Time: 11/06/22  7:00 PM  Result Value Ref Range   Sodium 130 (L) 135 - 145 mmol/L   Potassium 3.7 3.5 - 5.1 mmol/L   Chloride 103 98 - 111 mmol/L   CO2 19 (L) 22 - 32 mmol/L   Glucose, Bld 136 (H) 70 - 99 mg/dL    Comment: Glucose reference range applies only to samples taken after fasting for at least 8 hours.   BUN 70 (H) 8 - 23 mg/dL  Creatinine, Ser 0.96 0.61 - 1.24 mg/dL   Calcium 7.5 (L) 8.9 - 10.3 mg/dL   GFR, Estimated >16 >10 mL/min    Comment: (NOTE) Calculated using the CKD-EPI Creatinine Equation (2021)    Anion gap 8 5 - 15    Comment: Performed at [facility], [address] [place], [place] [address]  Protime-INR     Status: Abnormal   Collection Time: 11/06/22  7:00 PM  Result Value Ref Range   Prothrombin Time 15.3 (H) 11.4 - 15.2 seconds   INR 1.2 0.8 - 1.2    Comment: (NOTE) INR goal varies based on device and disease states. Performed at [facility], [address] [place], [place] [address]   Fibrinogen     Status: None   Collection Time: 11/06/22  7:00 PM  Result Value Ref Range   Fibrinogen 299 210 - 475 mg/dL    Comment: (NOTE) Fibrinogen results may be underestimated in patients receiving thrombolytic therapy. Performed at [facility], [address], [place], [place] [address]    Hepatic function panel     Status: Abnormal   Collection Time: 11/06/22  7:00 PM  Result Value Ref Range   Total Protein 5.3 (L) 6.5 - 8.1 g/dL   Albumin 2.8 (L) 3.5 - 5.0 g/dL   AST 27 15 - 41 U/L   ALT 16 0 - 44 U/L   Alkaline Phosphatase 45 38 - 126 U/L   Total Bilirubin 0.6 0.3 - 1.2 mg/dL   Bilirubin, Direct 0.2 0.0 - 0.2 mg/dL   Indirect Bilirubin 0.4 0.3 - 0.9 mg/dL    Comment: Performed at [facility], [address] [place], [place] [address]  Prepare RBC (crossmatch)     Status: None   Collection Time: 11/06/22 11:20 PM  Result Value Ref Range   Order Confirmation      ORDER PROCESSED BY BLOOD BANK Performed at [facility], [address] [place], [place] [address]   Type and screen [facility]     Status: None (Preliminary result)   Collection Time: 11/06/22 11:20 PM  Result Value Ref Range   ABO/RH(D) PENDING    Antibody Screen PENDING    Sample Expiration      11/09/2022,2359 Performed at [facility], [address] [place], [place] [address]    CT Soft Tissue Neck W Contrast  Result Date: 11/06/2022 CLINICAL DATA:  Bleeding from tracheostomy site. History of tonsillar carcinoma. EXAM: CT NECK WITH CONTRAST TECHNIQUE: Multidetector CT imaging of the neck was performed using the standard protocol following the bolus administration of intravenous contrast. RADIATION DOSE REDUCTION: This exam was performed according to the departmental dose-optimization program which includes automated exposure control, adjustment of the mA and/or kV according to patient size and/or use of iterative reconstruction technique. CONTRAST:  OMNIPAQUE IOHEXOL 350 MG/ML SOLN COMPARISON:  09/28/2021, 10/29/2021 FINDINGS: Pharynx and larynx: There is diffuse soft tissue abnormality throughout the pharynx, consistent with reported history of tonsillar carcinoma. The diffuse soft tissue thickening is likely a  sequela of radiation treatment. The epiglottis is thickened. No discrete mass is visible, but the loss of normal tissue planes somewhat degrades the search. Salivary glands: Unremarkable parotid glands. Submandibular glands are absent. Thyroid: Normal Lymph nodes: Bilateral neck dissections.  No lymphadenopathy. Vascular: Calcific aortic atherosclerosis. Bilateral carotid bifurcation atherosclerosis. Limited intracranial: Normal Visualized orbits: Normal Mastoids and visualized paranasal sinuses: Clear. Skeleton: Partial resection of  the mandible. Upper chest: No focal abnormality at the tracheostomy site. Other: None IMPRESSION: 1. Diffuse soft tissue abnormality throughout the pharynx, consistent with reported history of tonsillar carcinoma and likely due to prior radiation treatment. No discrete mass is visible, but the loss of normal tissue planes somewhat degrades the assessment. 2. Unremarkable appearance of the tracheostomy. No active hemorrhage or other visible abnormality. 3. Bilateral neck dissections. No lymphadenopathy. Aortic Atherosclerosis (ICD10-I70.0). Electronically Signed   By: Deatra Robinson M.D.   On: 11/06/2022 22:25   CT Angio Chest PE W and/or Wo Contrast  Result Date: 11/06/2022 CLINICAL DATA:  Patient coughed up blood. Low platelet count with oozing at the trach site. EXAM: CT ANGIOGRAPHY CHEST WITH CONTRAST TECHNIQUE: Multidetector CT imaging of the chest was performed using the standard protocol during bolus administration of intravenous contrast. Multiplanar CT image reconstructions and MIPs were obtained to evaluate the vascular anatomy. RADIATION DOSE REDUCTION: This exam was performed according to the departmental dose-optimization program which includes automated exposure control, adjustment of the mA and/or kV according to patient size and/or use of iterative reconstruction technique. CONTRAST:  OMNIPAQUE IOHEXOL 350 MG/ML SOLN COMPARISON:  Chest radiograph 09/29/2021 and  PET/CT 03/07/2021 FINDINGS: Cardiovascular: Satisfactory opacification of the central and lobar pulmonary arteries. The segmental and subsegmental branches in the lower lobes are partially obscured by motion artifact. Nonocclusive filling defects in a right lower lobe pulmonary artery on series 10/image 184 and 6/92 may be artifactual given motion in this area though pulmonary embolism is difficult to exclude. No pericardial effusion. Coronary artery and aortic atherosclerotic calcification. Mediastinum/Nodes: Tracheostomy tube tip in the upper trachea. Esophagus is unremarkable. No thoracic adenopathy by size. Lungs/Pleura: Emphysema. Patchy ground-glass and consolidative opacities greatest in the left lower lobe. There is occlusion of the bilateral lower lobe bronchi with air-fluid level on the left (series 10/image 163). No pleural effusion or pneumothorax. Upper Abdomen: Cholelithiasis.  No acute abnormality Musculoskeletal: No acute fracture or destructive osseous lesion. Review of the MIP images confirms the above findings. IMPRESSION: 1. Patchy ground-glass and consolidative opacities greatest in the left lower lobe. Occlusion of the bilateral lower lobe bronchi with air-fluid level on the left. Given history of oozing at the trach site this may be due to aspiration of blood. Differential consideration is aspiration with multifocal pneumonia. 2. Nonocclusive filling defects in a right lower lobe pulmonary artery may be due to small subsegmental emboli or artifactual given motion in this area. Aortic Atherosclerosis (ICD10-I70.0). Critical Value/emergent results were called by telephone at the time of interpretation on 11/06/2022 at 10:22 pm to provider [facility] , who verbally acknowledged these results. Electronically Signed   By: Minerva Fester M.D.   On: 11/06/2022 22:24    Pending Labs Unresulted Labs (From admission, onward)     Start     Ordered   11/07/22 0500  CBC  Tomorrow morning,   R         11/06/22 2307   11/06/22 2307  Prepare platelet pheresis  (Blood Administration Adult)  ONCE - STAT,   STAT       Question Answer Comment  Number of Apheresis Units (1 unit of apheresis platelets will increase platelets 30,000/mL in an avg sized adult) 1 unit   Transfusion Indications Bleeding due to thrombocytopenia or platelet fcn anormality   Transfusion Indications Plt = 10,000   Date/Time blood product needed For transfusion   If emergent release call blood bank Not emergent release  11/06/22 2306            Vitals/Pain Today's Vitals   11/06/22 2207 11/06/22 2344 11/07/22 0000 11/07/22 0015  BP: 131/76 (!) 153/76 98/61 129/88  Pulse: 90 78 84 83  Resp: 18 16 (!) 21 17  Temp: (!) 97.4 F (36.3 C) 98 F (36.7 C)    TempSrc: Axillary     SpO2: 97% 100% 95% 93%  Weight:      Height:      PainSc:        Isolation Precautions No active isolations  Medications Medications  Immune Globulin 10% (PRIVIGEN) IV infusion 75 g ( Intravenous Rate/Dose Change 11/07/22 0015)  0.9 %  sodium chloride infusion (Manually program via Guardrails IV Fluids) (has no administration in time range)  iohexol (OMNIPAQUE) 350 MG/ML injection 100 mL (has no administration in time range)  ondansetron (ZOFRAN) injection 4 mg (has no administration in time range)  0.9 %  sodium chloride infusion (Manually program via Guardrails IV Fluids) (has no administration in time range)  iohexol (OMNIPAQUE) 350 MG/ML injection 100 mL (100 mLs Intravenous Contrast Given 11/06/22 2146)    Mobility walks with person assist

## 2022-11-07 NOTE — Progress Notes (Signed)
Pt transferred to [facility].

## 2022-11-07 NOTE — Progress Notes (Signed)
Initial Nutrition Assessment  DOCUMENTATION CODES:   Not applicable  INTERVENTION:   Bolus tube feeding regimen via G-tube: - 1 carton Jevity 1.5 cal formula 5 x daily - Free water flush of 50 ml before and after each bolus feed - Additional free water flush of 100 ml TID to meet maintenance free water needs  Bolus tube feeding regimen with free water flushes provides 1775 kcal, 75.5 grams of protein, and 1700 ml of H2O.   NUTRITION DIAGNOSIS:   Inadequate oral intake related to dysphagia as evidenced by NPO status.  GOAL:   Patient will meet greater than or equal to 90% of their needs  MONITOR:   Labs, Weight trends, TF tolerance, I & O's  REASON FOR ASSESSMENT:   Consult Enteral/tube feeding initiation and management  ASSESSMENT:   72 year old male who presented to the ED on 6/21 from [facility] with hemoptysis. 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 Pt admitted with tracheostomy hemorrhage.  RD working remotely.  Consult received for enteral nutrition initiation and management.  Noted Oncology recommending IVIG x 2 days. Pt has received numerous transfusions without success. [facility]NT [facility].  Reviewed Home Meds list and Care Everywhere. Unable to find information regarding pt's home tube feeding regimen.  Spoke with pt's wife via phone call. She reports that pt either takes 3 bottles of Isosource 1.5 and 2 bottles of Premier Protein daily per tube OR 5 bottles of Isosource 1.5 daily per tube. Pt does not take any POs. Pt's weight has been stable at 190 lbs on the above regimen for many years per wife. Weight history in chart confirms  this.  Tube feeding regimen of 3 bottles of Isosource 1.5 + 2 bottles of Premier Protein provides 1445 kcal and 111 grams of protein daily.  Tube feeding regimen of 5 bottles of Isosource 1.5 provides 1875 kcal and 85 grams of protein daily.  Pt has tried Jevity before and tolerates it well. He is willing to receive Jevity 1.5 during admission. RD to order.  Medications reviewed and include: decadron 40 mg daily, IVIG  Labs reviewed: sodium 130, BUN 70, WBC 20.1, hemoglobin 8.5, platelets <5  UOP: 625 ml x 12 hours  NUTRITION - FOCUSED PHYSICAL EXAM:  Unable to complete at this time. RD working remotely.  Diet Order:   Diet Order     None       EDUCATION NEEDS:   No education needs have been identified at this time  Skin:  Skin Assessment: Reviewed RN Assessment  Last BM:  11/05/22  Height:   Ht Readings from Last 1 Encounters:  11/07/22 5\' 8"  (1.727 m)    Weight:   Wt Readings from Last 1 Encounters:  11/07/22 89.3 kg    BMI:  Body mass index is 29.93 kg/m.  Estimated Nutritional Needs:   Kcal:  1750-1950  Protein:  75-95 grams  Fluid:  >1.7 L    Mertie Clause, MS, RD, LDN Inpatient Clinical Dietitian Please see AMiON for contact information.

## 2022-11-07 NOTE — Progress Notes (Signed)
eLink Physician-Brief Progress Note Patient Name: William Bell DOB: 1951/03/25 MRN: [id number]   Date of Service  11/07/2022  HPI/Events of Note  Patient resting comfortably in bed, no respiratory distress.  eICU Interventions  Hospitalist to continue to monitor patient closely.        Thomasene Lot Sebastian Dzik 11/07/2022, 2:04 AM

## 2022-11-07 NOTE — Progress Notes (Addendum)
PROGRESS NOTE    William Bell  WUJ:[id number] DOB: 1950/06/18 DOA: 11/06/2022 PCP: Lise Auer, MD    Brief Narrative:   William Bell is a 72 y.o. 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  Patient reports the last 72 hours began to have hemoptysis, epistasis and increased ecchymosis on all of his extremities; bleeding from his trach site.  Was seen by his medical oncologist, Dr. Melvyn Neth day prior to admission and began treatment for recurrent ITP with him receiving Rituxan, Decadron, Solu-Medrol, Nplate and 2 unit platelet transfusion.  Despite treatment, his bleeding persist and was recommended to transfer from the cancer center to [facility] for further evaluation and management.  Patient completed radiation in April 2017 and recently seen at [facility] in which his evaluation showed him to remain disease-free.  He does have a chronic tracheostomy (utilizing laryngectomy tube within the tracheostomy), and G-tube dependent.  In the ED, temperature 97.7 F, HR 70, RR 20, BP 108/56, SpO2 97% on room air.  Sodium 130, potassium 3.7, chloride 103, CO2 19, glucose 136, BUN 70, creatinine 0.96.  AST 27, ALT 16, total bilirubin 0.6.  WBC 26.5, hemoglobin 7.1, platelets less than 5.  Fibrinogen 299, INR 1.2.  CT angiogram chest with patchy GGO and consolidative opacities greatest in the left lower lobe, occlusion of the bilateral lower lobe bronchi with air-fluid level on the left likely secondary to aspiration of blood, nonocclusive filling defects right lower lobe pulmonary artery may be due to small subsegmental emboli versus artifact.  CT soft tissue neck with contrast with diffuse soft tissue abnormality  throughout the pharynx consistent with reported history of tonsillar carcinoma and likely due to prior radiation treatment, no discrete mass visible blood loss of normal tissue planes somewhat degrades assessment, unremarkable appearance of the tracheostomy, no active hemorrhage or other visible abnormality.  Medical oncology, ENT, PCCM was consulted.  TRH consulted for admission for further evaluation and management of oropharyngeal hemorrhage in the setting of severe refractory ITP with history of squamous cell carcinoma right tonsil.  Assessment & Plan:   Oropharyngeal hemorrhage Severe ITP, refractory to splenectomy Hx squamous cell carcinoma right tonsil Acute blood loss anemia Patient presenting to [facility] as a transfer from [place] with hemoptysis secondary to severe ITP in the setting of history of squamous cell carcinoma the right tonsil.  On admission, hemoglobin 7.1 (14.1 on 6/19).  Fibrinogen within normal limits.  Platelet count less than 5.  INR 1.2.  Initiation of treatment at [facility] in which he received Rituxan, Decadron, Solu-Medrol, Nplate and 2 unit platelet transfusion; but hemoptysis continued.  ENT was consulted and patient underwent transnasal fiberoptic laryngoscopy on 6/22 with no source of bleeding seen in the tracheostoma, trachea or nasal passages but view of the pharynx limited by anatomical collapse and blood.  Patient's tracheostomy exchanged for a #6 cuffed Shiley trach in which it was inflated.  Evaluated by PCCM, nothing else to offer and now signed off. -- ENT, medical oncology following, appreciate assistance -- Hgb 7.1>8.5 -- Plt <5, <5 -- s/p TXA 6/22 -- Dexamethasone 40 mg per tube daily -- IVIG 1 g/kg daily x 2 days -- Aminocaproic acid oral solution 10 mL swish and  spit every hour -- s/p 2u pRBC 6/22 -- receiving 3 u Platelets currently -- Repeat CBC 2 hours following platelet transfusion -- ENT and medical  oncology recommends transfer to tertiary/academic center as he may require advanced endoscopic techniques not available at [facility] long; currently on waiting list per transfer center; as admitting physician discussed case with ENT at [facility] Dr. Evette Doffing.  Patient also is followed by ENT there, Dr. Christoper Allegra.  Essential hypertension -- Lisinopril 2.5 mg per tube daily -- Hydralazine 10 mg per tube q6h PRN SBP >165  Hypothyroidism -- Continue levothyroxine 75 mcg per tube daily  Hyperlipidemia -- Atorvastatin 40 mg per tube daily  Anxiety/depression -- Citalopram 20 mg per tube daily  Nutrition -- Dietitian consulted to initiate tube feeds   DVT prophylaxis: SCDs Start: 11/07/22 0027    Code Status: Full Code Family Communication: No family present at bedside this morning  Disposition Plan:  Level of care: ICU Status is: Inpatient Remains inpatient appropriate because: Continues with active hemorrhage, requiring multiple blood product administration, repeat fiberoptic laryngoscopy today, pending transfer to [facility]    Consultants:  PCCM signed off 6/22 ENT, Dr. Jenne Pane Medical oncology, Dr. Emeline Darling such  Procedures:  Transnasal fiberoptic laryngoscopy, 6/22; Dr. Jenne Pane   Antimicrobials:  none    Subjective: Patient seen examined bedside, resting comfortably.  Continues with blood from oropharynx, using suction device.  RN present at bedside.  Communicating via paper and pen given trach exchanged overnight with cuffed trach to avoid any further aspiration of blood contents into his lungs.  Discussed with PCCM this morning, Dr. Judeth Horn; unfortunately not much they can offer at this time and IR embolization would likely be futile given his platelet count remaining low.  Discussed with medical oncology, continuing to transfuse platelets today, on steroids, IVIG, and oral Aminocaproic acid.  No other specific complaints at this time.  Denies headache, no  dizziness, no chest pain, no shortness of breath, no abdominal pain.  Prognosis remains guarded, attempting transfer to tertiary/academic center, currently on a waiting list per admitting physician for [facility].   Objective: Vitals:   11/07/22 1020 11/07/22 1054 11/07/22 1100 11/07/22 1106  BP: (!) 167/102 (!) 144/78 (!) 145/78   Pulse: 79 73 77 80  Resp: 17 15 20 19   Temp: 97.8 F (36.6 C) 98.5 F (36.9 C)    TempSrc: Oral     SpO2: 100%  92% 95%  Weight:      Height:        Intake/Output Summary (Last 24 hours) at 11/07/2022 1125 Last data filed at 11/07/2022 1610 Gross per 24 hour  Intake 1536.16 ml  Output 625 ml  Net 911.16 ml   Filed Weights   11/06/22 1814 11/07/22 0125  Weight: 88 kg 89.3 kg    Examination:  Physical Exam: GEN: NAD, alert and oriented x 3, chronically ill in appearance HEENT: Continued hemoptysis noted within oral cavity, self suctioning, tracheostomy site noted with surrounding blood oozing PULM: CTAB w/o wheezes/crackles, normal respiratory effort, tracheostomy site noted, on room air CV: RRR w/o M/G/R GI: abd soft, NTND, NABS, no R/G/M MSK: no peripheral edema, moves all extremities independently NEURO: No focal neurological deficits appreciated Integumentary: Ecchymosis noted throughout, no other concerning rashes/lesions/wounds appreciated    Data Reviewed: I have personally reviewed following labs and imaging studies  CBC: Recent Labs  Lab 11/04/22 0000 11/05/22 0000 11/06/22 0000 11/06/22 1900 11/07/22 0838  WBC 7.7 17.8 21.2 26.5* 20.1*  NEUTROABS 4.93  16.55 19.50 25.0*  --   HGB 14.1 12.9* 9.7* 7.1* 8.5*  HCT 41 38* 29* 21.7* 25.3*  MCV  --   --   --  98.2 94.4  PLT 4* 10* 4* <5* <5*   Basic Metabolic Panel: Recent Labs  Lab 11/06/22 1900  NA 130*  K 3.7  CL 103  CO2 19*  GLUCOSE 136*  BUN 70*  CREATININE 0.96  CALCIUM 7.5*   GFR: Estimated Creatinine Clearance: 74.4 mL/min (by C-G formula based on SCr of 0.96  mg/dL). Liver Function Tests: Recent Labs  Lab 11/06/22 1900  AST 27  ALT 16  ALKPHOS 45  BILITOT 0.6  PROT 5.3*  ALBUMIN 2.8*   No results for input(s): "LIPASE", "AMYLASE" in the last 168 hours. No results for input(s): "AMMONIA" in the last 168 hours. Coagulation Profile: Recent Labs  Lab 11/06/22 1900  INR 1.2   Cardiac Enzymes: No results for input(s): "CKTOTAL", "CKMB", "CKMBINDEX", "TROPONINI" in the last 168 hours. BNP (last 3 results) No results for input(s): "PROBNP" in the last 8760 hours. HbA1C: No results for input(s): "HGBA1C" in the last 72 hours. CBG: No results for input(s): "GLUCAP" in the last 168 hours. Lipid Profile: No results for input(s): "CHOL", "HDL", "LDLCALC", "TRIG", "CHOLHDL", "LDLDIRECT" in the last 72 hours. Thyroid Function Tests: No results for input(s): "TSH", "T4TOTAL", "FREET4", "T3FREE", "THYROIDAB" in the last 72 hours. Anemia Panel: No results for input(s): "VITAMINB12", "FOLATE", "FERRITIN", "TIBC", "IRON", "RETICCTPCT" in the last 72 hours. Sepsis Labs: No results for input(s): "PROCALCITON", "LATICACIDVEN" in the last 168 hours.  Recent Results (from the past 240 hour(s))  MRSA Next Gen by PCR, Nasal     Status: None   Collection Time: 11/07/22  1:39 AM   Specimen: Nasal Mucosa; Nasal Swab  Result Value Ref Range Status   MRSA by PCR Next Gen NOT DETECTED NOT DETECTED Final    Comment: (NOTE) The GeneXpert MRSA Assay (FDA approved for NASAL specimens only), is one component of a comprehensive MRSA colonization surveillance program. It is not intended to diagnose MRSA infection nor to guide or monitor treatment for MRSA infections. Test performance is not FDA approved in patients less than 22 years old. Performed at [facility], [address] [place], [place] [address]          Radiology Studies: CT Soft Tissue Neck W Contrast  Result Date: 11/06/2022 CLINICAL DATA:  Bleeding from tracheostomy  site. History of tonsillar carcinoma. EXAM: CT NECK WITH CONTRAST TECHNIQUE: Multidetector CT imaging of the neck was performed using the standard protocol following the bolus administration of intravenous contrast. RADIATION DOSE REDUCTION: This exam was performed according to the departmental dose-optimization program which includes automated exposure control, adjustment of the mA and/or kV according to patient size and/or use of iterative reconstruction technique. CONTRAST:  OMNIPAQUE IOHEXOL 350 MG/ML SOLN COMPARISON:  09/28/2021, 10/29/2021 FINDINGS: Pharynx and larynx: There is diffuse soft tissue abnormality throughout the pharynx, consistent with reported history of tonsillar carcinoma. The diffuse soft tissue thickening is likely a sequela of radiation treatment. The epiglottis is thickened. No discrete mass is visible, but the loss of normal tissue planes somewhat degrades the search. Salivary glands: Unremarkable parotid glands. Submandibular glands are absent. Thyroid: Normal Lymph nodes: Bilateral neck dissections.  No lymphadenopathy. Vascular: Calcific aortic atherosclerosis. Bilateral carotid bifurcation atherosclerosis. Limited intracranial: Normal Visualized orbits: Normal Mastoids and visualized paranasal sinuses: Clear. Skeleton: Partial resection of the mandible. Upper chest: No focal abnormality at  the tracheostomy site. Other: None IMPRESSION: 1. Diffuse soft tissue abnormality throughout the pharynx, consistent with reported history of tonsillar carcinoma and likely due to prior radiation treatment. No discrete mass is visible, but the loss of normal tissue planes somewhat degrades the assessment. 2. Unremarkable appearance of the tracheostomy. No active hemorrhage or other visible abnormality. 3. Bilateral neck dissections. No lymphadenopathy. Aortic Atherosclerosis (ICD10-I70.0). Electronically Signed   By: Deatra Robinson M.D.   On: 11/06/2022 22:25   CT Angio Chest PE W and/or Wo  Contrast  Result Date: 11/06/2022 CLINICAL DATA:  Patient coughed up blood. Low platelet count with oozing at the trach site. EXAM: CT ANGIOGRAPHY CHEST WITH CONTRAST TECHNIQUE: Multidetector CT imaging of the chest was performed using the standard protocol during bolus administration of intravenous contrast. Multiplanar CT image reconstructions and MIPs were obtained to evaluate the vascular anatomy. RADIATION DOSE REDUCTION: This exam was performed according to the departmental dose-optimization program which includes automated exposure control, adjustment of the mA and/or kV according to patient size and/or use of iterative reconstruction technique. CONTRAST:  OMNIPAQUE IOHEXOL 350 MG/ML SOLN COMPARISON:  Chest radiograph 09/29/2021 and PET/CT 03/07/2021 FINDINGS: Cardiovascular: Satisfactory opacification of the central and lobar pulmonary arteries. The segmental and subsegmental branches in the lower lobes are partially obscured by motion artifact. Nonocclusive filling defects in a right lower lobe pulmonary artery on series 10/image 184 and 6/92 may be artifactual given motion in this area though pulmonary embolism is difficult to exclude. No pericardial effusion. Coronary artery and aortic atherosclerotic calcification. Mediastinum/Nodes: Tracheostomy tube tip in the upper trachea. Esophagus is unremarkable. No thoracic adenopathy by size. Lungs/Pleura: Emphysema. Patchy ground-glass and consolidative opacities greatest in the left lower lobe. There is occlusion of the bilateral lower lobe bronchi with air-fluid level on the left (series 10/image 163). No pleural effusion or pneumothorax. Upper Abdomen: Cholelithiasis.  No acute abnormality Musculoskeletal: No acute fracture or destructive osseous lesion. Review of the MIP images confirms the above findings. IMPRESSION: 1. Patchy ground-glass and consolidative opacities greatest in the left lower lobe. Occlusion of the bilateral lower lobe bronchi  with air-fluid level on the left. Given history of oozing at the trach site this may be due to aspiration of blood. Differential consideration is aspiration with multifocal pneumonia. 2. Nonocclusive filling defects in a right lower lobe pulmonary artery may be due to small subsegmental emboli or artifactual given motion in this area. Aortic Atherosclerosis (ICD10-I70.0). Critical Value/emergent results were called by telephone at the time of interpretation on 11/06/2022 at 10:22 pm to provider [facility] , who verbally acknowledged these results. Electronically Signed   By: Minerva Fester M.D.   On: 11/06/2022 22:24        Scheduled Meds:  sodium chloride   Intravenous Once   aminocaproic acid  10 mL Oral Q1H   [START ON 11/08/2022] atorvastatin  40 mg Per Tube Daily   Chlorhexidine Gluconate Cloth  6 each Topical Daily   [START ON 11/08/2022] dexamethasone  40 mg Per Tube Daily   feeding supplement (JEVITY 1.5 CAL/FIBER)  237 mL Per Tube 5 X Daily   free water  100 mL Per Tube 5 X Daily   free water  100 mL Per Tube TID   levothyroxine  75 mcg Per Tube Q0600   ondansetron (ZOFRAN) IV  4 mg Intravenous Once   Continuous Infusions:  Immune Globulin 10% 211 mL/hr at 11/07/22 0620     LOS: 1 day    Critical Care  Time Upon my evaluation, this patient had a high probability of imminent or life-threatening deterioration due to oropharyngeal hemorrhage with severe ITP, which required my direct attention, intervention, and personal management.  I have personally provided 68 minutes of critical care time exclusive of my time spent on separately billable procedures.  Time includes review of laboratory data, radiology results, discussion with consultants, and monitoring for potential decompensation.       Alvira Philips [place], DO [facility] Available via Epic secure chat 7am-7pm After these hours, please refer to coverage provider listed on [email or web address] 11/07/2022, 11:25 AM

## 2022-11-07 NOTE — Progress Notes (Signed)
Brief PCCM note:  72 year old with history of head and neck cancer in remission and history of ITP presents with oropharyngeal bleeding.  Bloody secretions coughed up via trach as well.  ENT scoped at bedside twice, no nasal bleeding, no bleeding visualized lung fields.  A lot of bleeding in the oropharynx.  Suspect coughed up secretions via tracheostomy is aspirated blood from oropharyngeal bleeding.  His platelets are less than 5.  Attempts to stop bleeding are essentially futile until platelet count recovers. --Very much appreciate hematology assistance in efforts to raise platelet counts -- Continue supportive care, trend hemoglobin, transfusion support for hemoglobin less than 7 and platelets transfusion per hematology recommendations --Appreciate ENT assistance, they recommended transfer to academic center where he previously received care, appreciate primary efforts at achieving this transfer --Given bleeding is not originating from the lungs no role for additional pulmonary intervention  PCCM will sign off please contact us if we can be helpful in any way in the future

## 2022-11-07 NOTE — Assessment & Plan Note (Addendum)
Believed to be in long term remission since completing XRT in 2017.  Pt is now trach and PEG dependent.  No obvious recurrence on CT neck today. Trach and PEG dependent. RD consult in AM

## 2022-11-07 NOTE — Progress Notes (Signed)
Patient had a 22 beat run of Vtach, patient asymptomatic, VS stable. Notified MD [place]. Will obtain a BMET with magnesium after platelets are finished infusing.

## 2022-11-07 NOTE — Progress Notes (Signed)
Subjective: Still suctioning blood from his throat.  Not coughing it out of his tracheostomy.  Cuff remains inflated.  Objective: Vital signs in last 24 hours: Temp:  [97.4 F (36.3 C)-98.7 F (37.1 C)] 97.9 F (36.6 C) (06/22 0735) Pulse Rate:  [69-92] 82 (06/22 0812) Resp:  [13-21] 17 (06/22 0812) BP: (67-187)/(46-95) 187/95 (06/22 0700) SpO2:  [89 %-100 %] 96 % (06/22 0812) FiO2 (%):  [28 %] 28 % (06/22 0812) Weight:  [88 kg-89.3 kg] 89.3 kg (06/22 0125) Wt Readings from Last 1 Encounters:  11/07/22 89.3 kg    Intake/Output from previous day: 06/21 0701 - 06/22 0700 In: 1536.2 [I.V.:236.2; Blood:1200; IV Piggyback:100] Out: 625 [Urine:625] Intake/Output this shift: No intake/output data recorded.  General appearance: alert, cooperative, and no distress Nose: no nose bleeding, passed scope through both nasal passages again and no blood or bleeding in either side of the nose Throat: suctions bloody clot from throat at times Neck: #6 cuffed Shiley in place with cuff inflated  Recent Labs    11/06/22 0000 11/06/22 1900  WBC 21.2 26.5*  HGB 9.7* 7.1*  HCT 29* 21.7*  PLT 4* <5*    Recent Labs    11/06/22 1900  NA 130*  K 3.7  CL 103  CO2 19*  GLUCOSE 136*  BUN 70*  CREATININE 0.96  CALCIUM 7.5*    Medications: I have reviewed the patient's current medications.  Assessment/Plan: Hemoptysis, history of oropharyngeal cancer, thrombocytopenia  Airway secure.  Continues with bleeding that seems to be coming from throat.  Difficult place to control procedurally.  Medical efforts continue to improve platelet count which is most likely to control his bleeding.   LOS: 1 day   Christia Reading 11/07/2022, 9:29 AM

## 2022-11-07 NOTE — Progress Notes (Signed)
Called by [facility] transfer center: I gave sign out to Dr. Ronney Lion, intensivist at [facility] for transfer.  Pt accepted to a MICU bed.

## 2022-11-07 NOTE — Consult Note (Signed)
Reason for Consult: Hemoptysis Referring Physician: Hospitalist  William Bell is an 72 y.o. male.  HPI: 72 year old male and patient of Dr. Christoper Allegra at [facility] with history of radiation treatment of tonsillar cancer in 2017 who developed osteoradionecrosis of the mandible.  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  He also has a history of ITP and has  had bleeding events in the past with unclear source.  He was last seen by Dr. Christoper Allegra about a week ago.  He has a lary tube in his tracheostoma.  He was admitted early this morning from the ER due to hemoptysis for the past few dys and was found to have a platelet count of <5.  Efforts to replete the platelet count have been futile.  Consult was requested due to bleeding from the trach site.  Past Medical History:  Diagnosis Date   Chronic ITP (idiopathic thrombocytopenia) (HCC)    Refractory to splenectomy   Head and neck cancer [facility])     Past Surgical History:  Procedure Laterality Date   TRACHEOSTOMY      History reviewed. No pertinent family history.  Social History:  reports that he has quit smoking. He has never used smokeless tobacco. No history on file for alcohol use and drug use.  Allergies: No Known Allergies  Medications: I have reviewed the patient's current medications.  Results for orders placed or performed during the hospital encounter of 11/06/22 (from the past 48 hour(s))  CBC with Differential     Status: Abnormal   Collection Time: 11/06/22  7:00 PM  Result Value Ref Range   WBC 26.5 (H) 4.0 - 10.5 K/uL   RBC 2.21 (L) 4.22 - 5.81 MIL/uL   Hemoglobin 7.1 (L) 13.0 - 17.0 g/dL   HCT 16.1 (L) 09.6 - 04.5 %   MCV 98.2 80.0 - 100.0 fL   MCH 32.1 26.0 - 34.0 pg   MCHC 32.7 30.0 - 36.0 g/dL   RDW 40.9 81.1 - 91.4 %   Platelets <5 (LL) 150 - 400 K/uL    Comment: SPECIMEN CHECKED FOR CLOTS REPEATED TO VERIFY THIS CRITICAL RESULT HAS VERIFIED AND BEEN CALLED TO J ARMONDO, RN BY REGINA  MANGUM ON 06 21 2024 AT 1943, AND HAS BEEN READ BACK.     nRBC 0.0 0.0 - 0.2 %   Neutrophils Relative % 95 %   Neutro Abs 25.0 (H) 1.7 - 7.7 K/uL   Lymphocytes Relative 1 %   Lymphs Abs 0.4 (L) 0.7 - 4.0 K/uL   Monocytes Relative 3 %   Monocytes Absolute 0.9 0.1 - 1.0 K/uL   Eosinophils Relative 0 %   Eosinophils Absolute 0.0 0.0 - 0.5 K/uL   Basophils Relative 0 %   Basophils Absolute 0.0 0.0 - 0.1 K/uL   Immature Granulocytes 1 %   Abs Immature Granulocytes 0.20 (H) 0.00 - 0.07 K/uL    Comment: Performed at [facility] [address] [place], [place] [address]  Basic metabolic panel     Status: Abnormal   Collection Time: 11/06/22  7:00 PM  Result Value Ref Range   Sodium 130 (L) 135 - 145 mmol/L   Potassium 3.7 3.5 - 5.1 mmol/L   Chloride 103 98 - 111 mmol/L   CO2 19 (L) 22 - 32 mmol/L   Glucose, Bld 136 (H) 70 - 99 mg/dL    Comment: Glucose reference range applies only to samples taken after fasting for at least 8  hours.   BUN 70 (H) 8 - 23 mg/dL   Creatinine, Ser 1.61 0.61 - 1.24 mg/dL   Calcium 7.5 (L) 8.9 - 10.3 mg/dL   GFR, Estimated >09 >60 mL/min    Comment: (NOTE) Calculated using the CKD-EPI Creatinine Equation (2021)    Anion gap 8 5 - 15    Comment: Performed at [facility], [address], [place], [place] [address]  Protime-INR     Status: Abnormal   Collection Time: 11/06/22  7:00 PM  Result Value Ref Range   Prothrombin Time 15.3 (H) 11.4 - 15.2 seconds   INR 1.2 0.8 - 1.2    Comment: (NOTE) INR goal varies based on device and disease states. Performed at [facility], [address] [place], [place] [address]   Fibrinogen     Status: None   Collection Time: 11/06/22  7:00 PM  Result Value Ref Range   Fibrinogen 299 210 - 475 mg/dL    Comment: (NOTE) Fibrinogen results may be underestimated in patients receiving thrombolytic therapy. Performed at [facility], [address], [place], [place] [address]   Hepatic function panel     Status: Abnormal   Collection Time: 11/06/22  7:00 PM  Result Value Ref Range   Total Protein 5.3 (L) 6.5 - 8.1 g/dL   Albumin 2.8 (L) 3.5 - 5.0 g/dL   AST 27 15 - 41 U/L   ALT 16 0 - 44 U/L   Alkaline Phosphatase 45 38 - 126 U/L   Total Bilirubin 0.6 0.3 - 1.2 mg/dL   Bilirubin, Direct 0.2 0.0 - 0.2 mg/dL   Indirect Bilirubin 0.4 0.3 - 0.9 mg/dL    Comment: Performed at [facility], [address], [place], [place] [address]  Prepare RBC (crossmatch)     Status: None   Collection Time: 11/06/22 11:20 PM  Result Value Ref Range   Order Confirmation      ORDER PROCESSED BY BLOOD BANK Performed at [facility], [address], [place], [place] [address]   Type and screen [facility]     Status: None (Preliminary result)   Collection Time: 11/06/22 11:20 PM  Result Value Ref Range   ABO/RH(D) A POS    Antibody Screen NEG    Sample Expiration 11/09/2022,2359    Unit Number [id number]    Blood Component Type RED CELLS,LR    Unit division 00    Status of Unit ALLOCATED    Transfusion Status OK TO TRANSFUSE    Crossmatch Result Compatible    Unit Number [id number]    Blood Component Type RED CELLS,LR    Unit division 00    Status of Unit ISSUED    Transfusion Status OK TO TRANSFUSE    Crossmatch Result      Compatible Performed at [facility], [address], [place], [place] [address]   Prepare platelet pheresis     Status: None (Preliminary result)   Collection Time: 11/06/22 11:20 PM  Result Value Ref Range   Unit Number [id number]    Blood Component Type PLTP3 PSORALEN TREATED    Unit division 00    Status of Unit ISSUED    Transfusion Status      OK TO TRANSFUSE Performed at [facility], [address] [place], [place] [address]   MRSA Next Gen by PCR, Nasal     Status: None   Collection Time:  11/07/22  1:39 AM   Specimen: Nasal Mucosa; Nasal Swab  Result Value Ref Range   MRSA by PCR Next Gen NOT DETECTED NOT DETECTED    Comment: (NOTE) The GeneXpert MRSA Assay (FDA approved for NASAL specimens only), is one component of a comprehensive MRSA colonization surveillance program. It is not intended to diagnose MRSA infection nor to guide or monitor treatment for MRSA infections. Test performance is not FDA approved in patients less than 74 years old. Performed at [facility], [address], [place], [place] [address]     CT Soft Tissue Neck W Contrast  Result Date: 11/06/2022 CLINICAL DATA:  Bleeding from tracheostomy site. History of tonsillar carcinoma. EXAM: CT NECK WITH CONTRAST TECHNIQUE: Multidetector CT imaging of the neck was performed using the standard protocol following the bolus administration of intravenous contrast. RADIATION DOSE REDUCTION: This exam was performed according to the departmental dose-optimization program which includes automated exposure control, adjustment of the mA and/or kV according to patient size and/or use of iterative reconstruction technique. CONTRAST:  OMNIPAQUE IOHEXOL 350 MG/ML SOLN COMPARISON:  09/28/2021, 10/29/2021 FINDINGS: Pharynx and larynx: There is diffuse soft tissue abnormality throughout the pharynx, consistent with reported history of tonsillar carcinoma. The diffuse soft tissue thickening is likely a sequela of radiation treatment. The epiglottis is thickened. No discrete mass is visible, but the loss of normal tissue planes somewhat degrades the search. Salivary glands: Unremarkable parotid glands. Submandibular glands are absent. Thyroid: Normal Lymph nodes: Bilateral neck dissections.  No lymphadenopathy. Vascular: Calcific aortic atherosclerosis. Bilateral carotid bifurcation atherosclerosis. Limited intracranial: Normal Visualized orbits: Normal Mastoids and visualized paranasal sinuses: Clear. Skeleton:  Partial resection of the mandible. Upper chest: No focal abnormality at the tracheostomy site. Other: None IMPRESSION: 1. Diffuse soft tissue abnormality throughout the pharynx, consistent with reported history of tonsillar carcinoma and likely due to prior radiation treatment. No discrete mass is visible, but the loss of normal tissue planes somewhat degrades the assessment. 2. Unremarkable appearance of the tracheostomy. No active hemorrhage or other visible abnormality. 3. Bilateral neck dissections. No lymphadenopathy. Aortic Atherosclerosis (ICD10-I70.0). Electronically Signed   By: Deatra Robinson M.D.   On: 11/06/2022 22:25   CT Angio Chest PE W and/or Wo Contrast  Result Date: 11/06/2022 CLINICAL DATA:  Patient coughed up blood. Low platelet count with oozing at the trach site. EXAM: CT ANGIOGRAPHY CHEST WITH CONTRAST TECHNIQUE: Multidetector CT imaging of the chest was performed using the standard protocol during bolus administration of intravenous contrast. Multiplanar CT image reconstructions and MIPs were obtained to evaluate the vascular anatomy. RADIATION DOSE REDUCTION: This exam was performed according to the departmental dose-optimization program which includes automated exposure control, adjustment of the mA and/or kV according to patient size and/or use of iterative reconstruction technique. CONTRAST:  OMNIPAQUE IOHEXOL 350 MG/ML SOLN COMPARISON:  Chest radiograph 09/29/2021 and PET/CT 03/07/2021 FINDINGS: Cardiovascular: Satisfactory opacification of the central and lobar pulmonary arteries. The segmental and subsegmental branches in the lower lobes are partially obscured by motion artifact. Nonocclusive filling defects in a right lower lobe pulmonary artery on series 10/image 184 and 6/92 may be artifactual given motion in this area though pulmonary embolism is difficult to exclude. No pericardial effusion. Coronary artery and aortic atherosclerotic calcification. Mediastinum/Nodes:  Tracheostomy tube tip in the upper trachea. Esophagus is unremarkable. No thoracic adenopathy by size. Lungs/Pleura: Emphysema. Patchy ground-glass and consolidative opacities greatest in the left lower lobe. There is occlusion of the bilateral lower lobe bronchi with air-fluid  level on the left (series 10/image 163). No pleural effusion or pneumothorax. Upper Abdomen: Cholelithiasis.  No acute abnormality Musculoskeletal: No acute fracture or destructive osseous lesion. Review of the MIP images confirms the above findings. IMPRESSION: 1. Patchy ground-glass and consolidative opacities greatest in the left lower lobe. Occlusion of the bilateral lower lobe bronchi with air-fluid level on the left. Given history of oozing at the trach site this may be due to aspiration of blood. Differential consideration is aspiration with multifocal pneumonia. 2. Nonocclusive filling defects in a right lower lobe pulmonary artery may be due to small subsegmental emboli or artifactual given motion in this area. Aortic Atherosclerosis (ICD10-I70.0). Critical Value/emergent results were called by telephone at the time of interpretation on 11/06/2022 at 10:22 pm to provider [facility] , who verbally acknowledged these results. Electronically Signed   By: Minerva Fester M.D.   On: 11/06/2022 22:24    Review of Systems  All other systems reviewed and are negative.  Blood pressure (!) 141/82, pulse 92, temperature 97.6 F (36.4 C), temperature source Axillary, resp. rate 19, height 5\' 8"  (1.727 m), weight 89.3 kg, SpO2 93 %. Physical Exam Constitutional:      Appearance: Normal appearance. He is normal weight.  HENT:     Head: Normocephalic and atraumatic.     Right Ear: External ear normal.     Left Ear: External ear normal.     Nose: Nose normal.     Mouth/Throat:     Comments: Clot in oropharynx.  After suctioning, friable appearing oropharynx but no active bleeding visible. Eyes:     Extraocular Movements:  Extraocular movements intact.     Conjunctiva/sclera: Conjunctivae normal.     Pupils: Pupils are equal, round, and reactive to light.  Neck:     Comments: Lary tube in tracheostomy site.  Coughing blood from site intermittently. Cardiovascular:     Rate and Rhythm: Normal rate.  Pulmonary:     Effort: Pulmonary effort is normal.  Skin:    General: Skin is warm and dry.  Neurological:     General: No focal deficit present.     Mental Status: He is alert and oriented to person, place, and time.  Psychiatric:        Mood and Affect: Mood normal.        Behavior: Behavior normal.        Thought Content: Thought content normal.        Judgment: Judgment normal.     Assessment/Plan: Hemoptysis, history of oropharyngeal cancer, osteoradionecrosis, and ITP with thrombycytopenia.  Fiberoptic exam was performed at the bedside demonstrating no bleeding site at the stoma, down the trachea, or through the nose.  He has blood clot in the oropharynx.  This region is difficult to visualize due to trismus and limitation of fiberoptic view.  I discussed his case extensively with his medical doctors.  Efforts continue to try to improve his platelet count.  I recommended transfer to [facility] for Dr. Anselmo Rod evaluation.  His lary tube was changed to a cuffed #6 tracheostomy tube to reduce aspiration of blood.  Direct laryngoscopy under general anesthesia would be challenging in terms of good exposure and successful intervention.  Sedating him and packing the throat would be a desperate option but any effort with a platelet count <5 will have limited benefit.  William Bell 11/07/2022, 3:22 AM

## 2022-11-07 NOTE — Progress Notes (Signed)
Notified MD [place] in regards to recent CBC/diff showing plts <5, and hgb 7.1. MD [place] placed orders of 2 units of PRBCs. Recheck cbc/diff after PRBCs administered.

## 2022-11-07 NOTE — Discharge Summary (Signed)
Physician Discharge Summary   Patient: William Bell MRN: [id number] DOB: Feb 10, 1951  Admit date:     11/06/2022  Discharge date: 11/07/22  Discharge Physician: Dr Jenne Pane    PCP: Lise Auer, MD   Recommendations at discharge:     Discharge Diagnoses: Principal Problem:   Tracheostomy hemorrhage [facility]) Active Problems:   Malignant neoplasm of tonsillar fossa (HCC)   Acute ITP (HCC)   ABLA (acute blood loss anemia)  Resolved Problems:   * No resolved hospital problems. *  Hospital Course: Oropharyngeal hemorrhage Severe ITP, refractory to splenectomy Hx squamous cell carcinoma right tonsil Acute blood loss anemia Patient presenting to [facility] as a transfer from [facility] with hemoptysis secondary to severe ITP in the setting of history of squamous cell carcinoma the right tonsil.  On admission, hemoglobin 7.1 (14.1 on 6/19).  Fibrinogen within normal limits.  Platelet count less than 5.  INR 1.2.  Initiation of treatment at [facility] center in which he received Rituxan, Decadron, Solu-Medrol, Nplate and 2 unit platelet transfusion; but hemoptysis continued.  ENT was consulted and patient underwent transnasal fiberoptic laryngoscopy on 6/22 with no source of bleeding seen in the tracheostoma, trachea or nasal passages but view of the pharynx limited by anatomical collapse and blood.  Patient's tracheostomy exchanged for a #6 cuffed Shiley trach in which it was inflated.  Evaluated by PCCM, nothing else to offer and now signed off. -- ENT, medical oncology following, appreciate assistance -- Hgb 7.1>8.5 -- Plt <5, <5 -- s/p TXA 6/22 -- Dexamethasone 40 mg per tube daily -- IVIG 1 g/kg daily x 2 days -- Aminocaproic acid oral solution 10 mL swish and spit every hour -- s/p 3 -- receiving 4 u Platelets currently -- ENT and medical oncology recommends transfer to tertiary/academic center as he may require advanced endoscopic techniques not  available at [facility] long; currently on waiting list per transfer center; as admitting physician discussed case with ENT at [facility] Dr. Evette Doffing.  Patient also is followed by ENT there, Dr. Christoper Allegra. Hypertension post 3rd PRBC transfusion treated with hydralazine and 40 units lasix  Assessment and Plan:  * Tracheostomy hemorrhage (HCC) Pt with ongoing trach hemorrhage complicated by ITP with platelets < 5. Throwing the proverbial "kitchen sink" at his ITP + bleeding as discussed below. Dr. Jenne Pane at bedside currently, planning on scoping trach site to see what's bleeding. Also have given heads up to PCCM (see their notes).  ABLA (acute blood loss anemia) PRBC transfusions and management as above.  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 In past 72h or so pt has received prior to ED today: Ritxuan NPLATE Started on decadron PO 1 dose of solumedrol 125mg  IV 3 units of platelets  Additionally since arrival to ED and currently ordered: IVIG ordered by Dr. Bertis Ruddy who will do full consult on pt in AM 2u PRBC transfusion ordered 1u platelets given after IVIG, 2nd unit ordered TXA ordered - confirmed no contraindications and okay to give with Dr. Melvyn Neth (patients long term hematologist whom I spoke with on phone at pt request). Repeat CBC ordered for AM  Malignant neoplasm of tonsillar fossa (HCC) Believed to be in long term remission since completing XRT in 2017.  Pt is now trach and PEG dependent.  No obvious recurrence on CT neck today. Trach and PEG dependent. RD consult in AM         Consultants: oncologt, ENT Procedures performed:  Disposition:  [facility] Diet recommendation:  NPO   DISCHARGE MEDICATION: Allergies as of 11/07/2022   No Known Allergies      Medication List     STOP taking these medications    levothyroxine 88 MCG tablet Commonly known as: SYNTHROID       TAKE these medications     acetaminophen 500 MG tablet Commonly known as: TYLENOL Take 1,000 mg by mouth every 6 (six) hours as needed for mild pain or headache.   atorvastatin 40 MG tablet Commonly known as: LIPITOR Take 40 mg by mouth daily.   citalopram 20 MG tablet Commonly known as: CELEXA Take 20 mg by mouth daily.   co-enzyme Q-10 30 MG capsule Take 30 mg by mouth daily.   dexamethasone 4 MG tablet Commonly known as: DECADRON 10 pills po daily x 4 days What changed:  how much to take how to take this when to take this additional instructions   lisinopril 2.5 MG tablet Commonly known as: ZESTRIL Take 2.5 mg by mouth daily.        Discharge Exam: Filed Weights   11/06/22 1814 11/07/22 0125  Weight: 88 kg 89.3 kg     Condition at discharge:   The results of significant diagnostics from this hospitalization (including imaging, microbiology, ancillary and laboratory) are listed below for reference.   Imaging Studies: CT Soft Tissue Neck W Contrast  Result Date: 11/06/2022 CLINICAL DATA:  Bleeding from tracheostomy site. History of tonsillar carcinoma. EXAM: CT NECK WITH CONTRAST TECHNIQUE: Multidetector CT imaging of the neck was performed using the standard protocol following the bolus administration of intravenous contrast. RADIATION DOSE REDUCTION: This exam was performed according to the departmental dose-optimization program which includes automated exposure control, adjustment of the mA and/or kV according to patient size and/or use of iterative reconstruction technique. CONTRAST:  OMNIPAQUE IOHEXOL 350 MG/ML SOLN COMPARISON:  09/28/2021, 10/29/2021 FINDINGS: Pharynx and larynx: There is diffuse soft tissue abnormality throughout the pharynx, consistent with reported history of tonsillar carcinoma. The diffuse soft tissue thickening is likely a sequela of radiation treatment. The epiglottis is thickened. No discrete mass is visible, but the loss of normal tissue planes somewhat  degrades the search. Salivary glands: Unremarkable parotid glands. Submandibular glands are absent. Thyroid: Normal Lymph nodes: Bilateral neck dissections.  No lymphadenopathy. Vascular: Calcific aortic atherosclerosis. Bilateral carotid bifurcation atherosclerosis. Limited intracranial: Normal Visualized orbits: Normal Mastoids and visualized paranasal sinuses: Clear. Skeleton: Partial resection of the mandible. Upper chest: No focal abnormality at the tracheostomy site. Other: None IMPRESSION: 1. Diffuse soft tissue abnormality throughout the pharynx, consistent with reported history of tonsillar carcinoma and likely due to prior radiation treatment. No discrete mass is visible, but the loss of normal tissue planes somewhat degrades the assessment. 2. Unremarkable appearance of the tracheostomy. No active hemorrhage or other visible abnormality. 3. Bilateral neck dissections. No lymphadenopathy. Aortic Atherosclerosis (ICD10-I70.0). Electronically Signed   By: Deatra Robinson M.D.   On: 11/06/2022 22:25   CT Angio Chest PE W and/or Wo Contrast  Result Date: 11/06/2022 CLINICAL DATA:  Patient coughed up blood. Low platelet count with oozing at the trach site. EXAM: CT ANGIOGRAPHY CHEST WITH CONTRAST TECHNIQUE: Multidetector CT imaging of the chest was performed using the standard protocol during bolus administration of intravenous contrast. Multiplanar CT image reconstructions and MIPs were obtained to evaluate the vascular anatomy. RADIATION DOSE REDUCTION: This exam was performed according to the departmental dose-optimization program which includes automated exposure control, adjustment of  the mA and/or kV according to patient size and/or use of iterative reconstruction technique. CONTRAST:  OMNIPAQUE IOHEXOL 350 MG/ML SOLN COMPARISON:  Chest radiograph 09/29/2021 and PET/CT 03/07/2021 FINDINGS: Cardiovascular: Satisfactory opacification of the central and lobar pulmonary arteries. The segmental and  subsegmental branches in the lower lobes are partially obscured by motion artifact. Nonocclusive filling defects in a right lower lobe pulmonary artery on series 10/image 184 and 6/92 may be artifactual given motion in this area though pulmonary embolism is difficult to exclude. No pericardial effusion. Coronary artery and aortic atherosclerotic calcification. Mediastinum/Nodes: Tracheostomy tube tip in the upper trachea. Esophagus is unremarkable. No thoracic adenopathy by size. Lungs/Pleura: Emphysema. Patchy ground-glass and consolidative opacities greatest in the left lower lobe. There is occlusion of the bilateral lower lobe bronchi with air-fluid level on the left (series 10/image 163). No pleural effusion or pneumothorax. Upper Abdomen: Cholelithiasis.  No acute abnormality Musculoskeletal: No acute fracture or destructive osseous lesion. Review of the MIP images confirms the above findings. IMPRESSION: 1. Patchy ground-glass and consolidative opacities greatest in the left lower lobe. Occlusion of the bilateral lower lobe bronchi with air-fluid level on the left. Given history of oozing at the trach site this may be due to aspiration of blood. Differential consideration is aspiration with multifocal pneumonia. 2. Nonocclusive filling defects in a right lower lobe pulmonary artery may be due to small subsegmental emboli or artifactual given motion in this area. Aortic Atherosclerosis (ICD10-I70.0). Critical Value/emergent results were called by telephone at the time of interpretation on 11/06/2022 at 10:22 pm to provider [facility] , who verbally acknowledged these results. Electronically Signed   By: Minerva Fester M.D.   On: 11/06/2022 22:24    Microbiology: Results for orders placed or performed during the hospital encounter of 11/06/22  MRSA Next Gen by PCR, Nasal     Status: None   Collection Time: 11/07/22  1:39 AM   Specimen: Nasal Mucosa; Nasal Swab  Result Value Ref Range Status   MRSA by  PCR Next Gen NOT DETECTED NOT DETECTED Final    Comment: (NOTE) The GeneXpert MRSA Assay (FDA approved for NASAL specimens only), is one component of a comprehensive MRSA colonization surveillance program. It is not intended to diagnose MRSA infection nor to guide or monitor treatment for MRSA infections. Test performance is not FDA approved in patients less than 61 years old. Performed at [facility], [address] [place], [place] [address]     Labs: CBC: Recent Labs  Lab 11/04/22 0000 11/05/22 0000 11/06/22 0000 11/06/22 1900 11/07/22 0838 11/07/22 1703  WBC 7.7 17.8 21.2 26.5* 20.1* 14.6*  NEUTROABS 4.93 16.55 19.50 25.0*  --  13.8*  HGB 14.1 12.9* 9.7* 7.1* 8.5* 7.1*  HCT 41 38* 29* 21.7* 25.3* 21.5*  MCV  --   --   --  98.2 94.4 97.7  PLT 4* 10* 4* <5* <5* <5*   Basic Metabolic Panel: Recent Labs  Lab 11/06/22 1900 11/07/22 1703  NA 130* 133*  K 3.7 3.9  CL 103 103  CO2 19* 21*  GLUCOSE 136* 177*  BUN 70* 57*  CREATININE 0.96 0.94  CALCIUM 7.5* 7.6*  MG  --  1.9   Liver Function Tests: Recent Labs  Lab 11/06/22 1900  AST 27  ALT 16  ALKPHOS 45  BILITOT 0.6  PROT 5.3*  ALBUMIN 2.8*   CBG: No results for input(s): "GLUCAP" in the last 168 hours.  Discharge time spent: greater than 30  minutes.  Signed: Manuela Schwartz, NP [facility] 11/07/2022

## 2022-11-07 NOTE — Assessment & Plan Note (Addendum)
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 In past 72h or so pt has received prior to ED today: Ritxuan NPLATE Started on decadron PO 1 dose of solumedrol 125mg  IV 3 units of platelets  Additionally since arrival to ED and currently ordered: IVIG ordered by Dr. Bertis Ruddy who will do full consult on pt in AM 2u PRBC transfusion ordered 1u platelets given after IVIG, 2nd unit ordered TXA ordered - confirmed no contraindications and okay to give with Dr. Melvyn Neth (patients long term hematologist whom I spoke with on phone at pt request). Repeat CBC ordered for AM

## 2022-11-07 NOTE — Assessment & Plan Note (Signed)
PRBC transfusions and management as above.

## 2022-11-07 NOTE — H&P (Addendum)
History and Physical    Patient: William Bell WUJ:[id number] DOB: [birth year] DOA: 11/06/2022 DOS: the patient was seen and examined on 11/07/2022 PCP: Lise Auer, MD  Patient coming from: Home  Chief Complaint:  Chief Complaint  Patient presents with   Hemoptysis   HPI: William Bell is a 72 y.o. 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 The patient completed radiation for this cancer in April 2017. He was recently seen at [facility] where his evaluation showed him to remain disease free.  He does have a chronic tracheostomy (uses laryngectomy tube in the tracheostomy), is G tube dependent as well.  Unfortunately, in the past 72h or so he has begun to have both hemoptysis, epistaxis, increasing ecchymosis on all extremities, and bleeding from trach site.  Seen by Dr. Melvyn Neth (his oncologist) yesterday, began treatment for recurrent ITP over the past 48h prior to coming to [facility] ED he has received. 1) Rituxan 2) Decadron / Solumedrol 3) NPLATE 4) 2 units of platelets transfusion.  Despite all of this bleeding persists.  HGB has trended down from 14.1 on 6/19 to    Latest Ref Rng & Units 11/06/2022    7:00 PM 11/06/2022   12:00 AM 11/05/2022   12:00 AM  CBC  WBC 4.0 - 10.5 K/uL 26.5  21.2     17.8      Hemoglobin 13.0 - 17.0 g/dL 7.1  9.7     95.6      Hematocrit 39.0 - 52.0 % 21.7  29     38      Platelets 150 - 400 K/uL 5  4     10          This result is from an external source.   Platelets have remained in the 10 or less range and are < 5 in ED today.  Pt states his bleeding, while persistent, is actually slightly better than it was yesterday evening.   Review of Systems: As mentioned in the history of present illness.  All other systems reviewed and are negative. Past Medical History:  Diagnosis Date   Chronic ITP (idiopathic thrombocytopenia) (HCC)    Refractory to splenectomy   Head and neck cancer (HCC)    History reviewed. No pertinent surgical history. Social History:  reports that he has quit smoking. He has never used smokeless tobacco. No history on file for alcohol use and drug use.  No Known Allergies  History reviewed. No pertinent family history.  Prior to Admission medications   Medication Sig Start Date End Date Taking? Authorizing Provider  acetaminophen (TYLENOL) 500 MG tablet Take 1,000 mg by mouth every 6 (six) hours as needed for mild pain or headache.   Yes [provider]  atorvastatin (LIPITOR) 40 MG tablet Take 40 mg by mouth daily. 09/28/22  Yes [provider]  citalopram (CELEXA) 20 MG tablet Take 20 mg by mouth daily.   Yes [provider]  co-enzyme Q-10 30 MG capsule Take 30 mg by mouth daily.   Yes [provider]  dexamethasone (DECADRON) 4 MG tablet 10 pills po daily x 4 days Patient taking differently: Take 40 mg by mouth daily. 11/04/22  Yes Lewis, Dequincy A, MD  levothyroxine (SYNTHROID) 88  MCG tablet Take 88 mcg by mouth daily before breakfast.   Yes [provider]  lisinopril (ZESTRIL) 2.5 MG tablet Take 2.5 mg by mouth daily. 04/13/22  Yes [provider]  ALPRAZolam Prudy Feeler) 0.5 MG tablet Take 1 tablet (0.5 mg total) by mouth 3 (three) times daily as needed for anxiety. Patient not taking: Reported on 11/06/2022 06/20/20   Ilda Basset A, NP  EPINEPHrine 0.3 mg/0.3 mL IJ SOAJ injection Inject 0.3 mg into the muscle as needed for anaphylaxis. Patient not taking: Reported on 11/06/2022 06/22/20   Terald Sleeper, MD    Physical Exam: Vitals:   11/07/22 0000 11/07/22 0015 11/07/22 0030 11/07/22 0045  BP: 98/61 129/88 129/81 132/84  Pulse: 84 83 78 90  Resp: (!) 21 17 16 20   Temp:      TempSrc:      SpO2:  95% 93% 93% (!) 89%  Weight:      Height:       Constitutional: NAD, comfortable Neck: Oozing from trach site. Respiratory: clear to auscultation bilaterally, no wheezing, no crackles. Normal respiratory effort. No accessory muscle use.  Speaking in complete sentences, no respiratory distress or airway compromise at this point. Cardiovascular: Regular rate and rhythm, no murmurs / rubs / gallops. No extremity edema. 2+ pedal pulses. No carotid bruits.  Abdomen: no tenderness, no masses palpated. No hepatosplenomegaly. Bowel sounds positive. Neurologic: CN 2-12 grossly intact. Sensation intact, DTR normal. Strength 5/5 in all 4.  Psychiatric: Normal judgment and insight. Alert and oriented x 3. Normal mood.   Data Reviewed:    Labs on Admission: I have personally reviewed following labs and imaging studies  CBC: Recent Labs  Lab 11/04/22 0000 11/05/22 0000 11/06/22 0000 11/06/22 1900  WBC 7.7 17.8 21.2 26.5*  NEUTROABS 4.93 16.55 19.50 25.0*  HGB 14.1 12.9* 9.7* 7.1*  HCT 41 38* 29* 21.7*  MCV  --   --   --  98.2  PLT 4* 10* 4* <5*   Basic Metabolic Panel: Recent Labs  Lab 11/06/22 1900  NA 130*  K 3.7  CL 103  CO2 19*  GLUCOSE 136*  BUN 70*  CREATININE 0.96  CALCIUM 7.5*   GFR: Estimated Creatinine Clearance: 74.4 mL/min (by C-G formula based on SCr of 0.96 mg/dL). Liver Function Tests: Recent Labs  Lab 11/06/22 1900  AST 27  ALT 16  ALKPHOS 45  BILITOT 0.6  PROT 5.3*  ALBUMIN 2.8*   No results for input(s): "LIPASE", "AMYLASE" in the last 168 hours. No results for input(s): "AMMONIA" in the last 168 hours. Coagulation Profile: Recent Labs  Lab 11/06/22 1900  INR 1.2   Cardiac Enzymes: No results for input(s): "CKTOTAL", "CKMB", "CKMBINDEX", "TROPONINI" in the last 168 hours. BNP (last 3 results) No results for input(s): "PROBNP" in the last 8760 hours. HbA1C: No results for input(s): "HGBA1C" in the last 72 hours. CBG: No results for input(s):  "GLUCAP" in the last 168 hours. Lipid Profile: No results for input(s): "CHOL", "HDL", "LDLCALC", "TRIG", "CHOLHDL", "LDLDIRECT" in the last 72 hours. Thyroid Function Tests: No results for input(s): "TSH", "T4TOTAL", "FREET4", "T3FREE", "THYROIDAB" in the last 72 hours. Anemia Panel: No results for input(s): "VITAMINB12", "FOLATE", "FERRITIN", "TIBC", "IRON", "RETICCTPCT" in the last 72 hours. Urine analysis: No results found for: "COLORURINE", "APPEARANCEUR", "LABSPEC", "PHURINE", "GLUCOSEU", "HGBUR", "BILIRUBINUR", "KETONESUR", "PROTEINUR", "UROBILINOGEN", "NITRITE", "LEUKOCYTESUR"  Radiological Exams on Admission: CT Soft Tissue Neck W Contrast  Result Date: 11/06/2022 CLINICAL DATA:  Bleeding from tracheostomy site. History  of tonsillar carcinoma. EXAM: CT NECK WITH CONTRAST TECHNIQUE: Multidetector CT imaging of the neck was performed using the standard protocol following the bolus administration of intravenous contrast. RADIATION DOSE REDUCTION: This exam was performed according to the departmental dose-optimization program which includes automated exposure control, adjustment of the mA and/or kV according to patient size and/or use of iterative reconstruction technique. CONTRAST:  OMNIPAQUE IOHEXOL 350 MG/ML SOLN COMPARISON:  09/28/2021, 10/29/2021 FINDINGS: Pharynx and larynx: There is diffuse soft tissue abnormality throughout the pharynx, consistent with reported history of tonsillar carcinoma. The diffuse soft tissue thickening is likely a sequela of radiation treatment. The epiglottis is thickened. No discrete mass is visible, but the loss of normal tissue planes somewhat degrades the search. Salivary glands: Unremarkable parotid glands. Submandibular glands are absent. Thyroid: Normal Lymph nodes: Bilateral neck dissections.  No lymphadenopathy. Vascular: Calcific aortic atherosclerosis. Bilateral carotid bifurcation atherosclerosis. Limited intracranial: Normal Visualized orbits:  Normal Mastoids and visualized paranasal sinuses: Clear. Skeleton: Partial resection of the mandible. Upper chest: No focal abnormality at the tracheostomy site. Other: None IMPRESSION: 1. Diffuse soft tissue abnormality throughout the pharynx, consistent with reported history of tonsillar carcinoma and likely due to prior radiation treatment. No discrete mass is visible, but the loss of normal tissue planes somewhat degrades the assessment. 2. Unremarkable appearance of the tracheostomy. No active hemorrhage or other visible abnormality. 3. Bilateral neck dissections. No lymphadenopathy. Aortic Atherosclerosis (ICD10-I70.0). Electronically Signed   By: Deatra Robinson M.D.   On: 11/06/2022 22:25   CT Angio Chest PE W and/or Wo Contrast  Result Date: 11/06/2022 CLINICAL DATA:  Patient coughed up blood. Low platelet count with oozing at the trach site. EXAM: CT ANGIOGRAPHY CHEST WITH CONTRAST TECHNIQUE: Multidetector CT imaging of the chest was performed using the standard protocol during bolus administration of intravenous contrast. Multiplanar CT image reconstructions and MIPs were obtained to evaluate the vascular anatomy. RADIATION DOSE REDUCTION: This exam was performed according to the departmental dose-optimization program which includes automated exposure control, adjustment of the mA and/or kV according to patient size and/or use of iterative reconstruction technique. CONTRAST:  OMNIPAQUE IOHEXOL 350 MG/ML SOLN COMPARISON:  Chest radiograph 09/29/2021 and PET/CT 03/07/2021 FINDINGS: Cardiovascular: Satisfactory opacification of the central and lobar pulmonary arteries. The segmental and subsegmental branches in the lower lobes are partially obscured by motion artifact. Nonocclusive filling defects in a right lower lobe pulmonary artery on series 10/image 184 and 6/92 may be artifactual given motion in this area though pulmonary embolism is difficult to exclude. No pericardial effusion. Coronary  artery and aortic atherosclerotic calcification. Mediastinum/Nodes: Tracheostomy tube tip in the upper trachea. Esophagus is unremarkable. No thoracic adenopathy by size. Lungs/Pleura: Emphysema. Patchy ground-glass and consolidative opacities greatest in the left lower lobe. There is occlusion of the bilateral lower lobe bronchi with air-fluid level on the left (series 10/image 163). No pleural effusion or pneumothorax. Upper Abdomen: Cholelithiasis.  No acute abnormality Musculoskeletal: No acute fracture or destructive osseous lesion. Review of the MIP images confirms the above findings. IMPRESSION: 1. Patchy ground-glass and consolidative opacities greatest in the left lower lobe. Occlusion of the bilateral lower lobe bronchi with air-fluid level on the left. Given history of oozing at the trach site this may be due to aspiration of blood. Differential consideration is aspiration with multifocal pneumonia. 2. Nonocclusive filling defects in a right lower lobe pulmonary artery may be due to small subsegmental emboli or artifactual given motion in this area. Aortic Atherosclerosis (ICD10-I70.0). Critical Value/emergent results were  called by telephone at the time of interpretation on 11/06/2022 at 10:22 pm to provider [facility] , who verbally acknowledged these results. Electronically Signed   By: Minerva Fester M.D.   On: 11/06/2022 22:24    EKG: Independently reviewed.   Assessment and Plan: * Tracheostomy hemorrhage (HCC) Pt with ongoing trach hemorrhage complicated by ITP with platelets < 5. Throwing the proverbial "kitchen sink" at his ITP + bleeding as discussed below. Dr. Jenne Pane at bedside currently, planning on scoping trach site to see what's bleeding. Also have given heads up to PCCM (see their notes).  ABLA (acute blood loss anemia) PRBC transfusions and management as above.  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 In past 72h or so pt has  received prior to ED today: Ritxuan NPLATE Started on decadron PO 1 dose of solumedrol 125mg  IV 3 units of platelets  Additionally since arrival to ED and currently ordered: IVIG ordered by Dr. Bertis Ruddy who will do full consult on pt in AM 2u PRBC transfusion ordered 1u platelets given after IVIG, 2nd unit ordered TXA ordered - confirmed no contraindications and okay to give with Dr. Melvyn Neth (patients long term hematologist whom I spoke with on phone at pt request). Repeat CBC ordered for AM  Malignant neoplasm of tonsillar fossa (HCC) Believed to be in long term remission since completing XRT in 2017.  Pt is now trach and PEG dependent.  No obvious recurrence on CT neck today. Trach and PEG dependent. RD consult in AM      Advance Care Planning:   Code Status: Full Code  Consults: Dr. Jenne Pane at bedside. Dr. Bertis Ruddy ordered IVIG and will do full consult tomorrow. Also d/w Dr. Melvyn Neth and Dr. Marchelle Gearing.  Family Communication: No family in room  Severity of Illness: The appropriate patient status for this patient is INPATIENT. Inpatient status is judged to be reasonable and necessary in order to provide the required intensity of service to ensure the patient's safety. The patient's presenting symptoms, physical exam findings, and initial radiographic and laboratory data in the context of their chronic comorbidities is felt to place them at high risk for further clinical deterioration. Furthermore, it is not anticipated that the patient will be medically stable for discharge from the hospital within 2 midnights of admission.   * I certify that at the point of admission it is my clinical judgment that the patient will require inpatient hospital care spanning beyond 2 midnights from the point of admission due to high intensity of service, high risk for further deterioration and high frequency of surveillance required.*  CRITICAL CARE Performed by: Hillary Bow.   Total critical care  time: 180 minutes  Critical care time was exclusive of separately billable procedures and treating other patients.  Critical care was necessary to treat or prevent imminent or life-threatening deterioration.  Critical care was time spent personally by me on the following activities: development of treatment plan with patient and/or surrogate as well as nursing, discussions with consultants, evaluation of patient's response to treatment, examination of patient, obtaining history from patient or surrogate, ordering and performing treatments and interventions, ordering and review of laboratory studies, ordering and review of radiographic studies, pulse oximetry and re-evaluation of patient's condition.   Author: Hillary Bow., DO 11/07/2022 12:54 AM  For on call review [email or web address].

## 2022-11-07 NOTE — Assessment & Plan Note (Addendum)
Pt with ongoing trach hemorrhage complicated by ITP with platelets < 5. Throwing the proverbial "kitchen sink" at his ITP + bleeding as discussed below. Dr. Jenne Pane at bedside currently, planning on scoping trach site to see what's bleeding. Also have given heads up to PCCM (see their notes).

## 2022-11-09 ENCOUNTER — Inpatient Hospital Stay: Payer: Medicare Other

## 2022-11-09 ENCOUNTER — Encounter: Payer: Self-pay | Admitting: Oncology

## 2022-11-09 DIAGNOSIS — Z931 Gastrostomy status: Secondary | ICD-10-CM | POA: Diagnosis not present

## 2022-11-09 DIAGNOSIS — R1312 Dysphagia, oropharyngeal phase: Secondary | ICD-10-CM | POA: Diagnosis not present

## 2022-11-09 DIAGNOSIS — M272 Inflammatory conditions of jaws: Secondary | ICD-10-CM | POA: Diagnosis not present

## 2022-11-09 DIAGNOSIS — K1379 Other lesions of oral mucosa: Secondary | ICD-10-CM | POA: Diagnosis not present

## 2022-11-09 DIAGNOSIS — R0789 Other chest pain: Secondary | ICD-10-CM | POA: Diagnosis not present

## 2022-11-09 DIAGNOSIS — C099 Malignant neoplasm of tonsil, unspecified: Secondary | ICD-10-CM | POA: Diagnosis not present

## 2022-11-09 DIAGNOSIS — I82461 Acute embolism and thrombosis of right calf muscular vein: Secondary | ICD-10-CM | POA: Diagnosis not present

## 2022-11-09 DIAGNOSIS — Y842 Radiological procedure and radiotherapy as the cause of abnormal reaction of the patient, or of later complication, without mention of misadventure at the time of the procedure: Secondary | ICD-10-CM | POA: Diagnosis not present

## 2022-11-09 DIAGNOSIS — I420 Dilated cardiomyopathy: Secondary | ICD-10-CM | POA: Diagnosis not present

## 2022-11-09 DIAGNOSIS — D693 Immune thrombocytopenic purpura: Secondary | ICD-10-CM | POA: Diagnosis not present

## 2022-11-09 DIAGNOSIS — Z930 Tracheostomy status: Secondary | ICD-10-CM | POA: Diagnosis not present

## 2022-11-09 DIAGNOSIS — E079 Disorder of thyroid, unspecified: Secondary | ICD-10-CM | POA: Diagnosis not present

## 2022-11-09 DIAGNOSIS — I82431 Acute embolism and thrombosis of right popliteal vein: Secondary | ICD-10-CM | POA: Diagnosis not present

## 2022-11-09 DIAGNOSIS — I10 Essential (primary) hypertension: Secondary | ICD-10-CM | POA: Diagnosis not present

## 2022-11-09 LAB — TYPE AND SCREEN: Unit division: 0

## 2022-11-09 LAB — BPAM PLATELET PHERESIS
Blood Product Expiration Date: 202406232359
Blood Product Expiration Date: 202406252359
Blood Product Expiration Date: 202406252359
ISSUE DATE / TIME: 202406220527
Unit Type and Rh: 5100
Unit Type and Rh: 6200
Unit Type and Rh: 5100
Unit Type and Rh: 5100

## 2022-11-09 LAB — PREPARE PLATELET PHERESIS
Unit division: 0
Unit division: 0
Unit division: 0

## 2022-11-09 LAB — BPAM RBC
Blood Product Expiration Date: 202407142359
Blood Product Expiration Date: 202407152359
Blood Product Expiration Date: 202407152359
Unit Type and Rh: 6200

## 2022-11-09 MED FILL — Immune Globulin (Human) IV Soln 20 GM/200ML: INTRAVENOUS | Qty: 200 | Status: AC

## 2022-11-09 MED FILL — Immune Globulin (Human) IV Soln 40 GM/400ML: INTRAVENOUS | Qty: 400 | Status: AC

## 2022-11-09 MED FILL — Immune Globulin (Human) IV Soln 10 GM/100ML: INTRAVENOUS | Qty: 100 | Status: AC

## 2022-11-09 MED FILL — Immune Globulin (Human) IV Soln 5 GM/50ML: INTRAVENOUS | Qty: 50 | Status: AC

## 2022-11-10 DIAGNOSIS — Z9081 Acquired absence of spleen: Secondary | ICD-10-CM | POA: Diagnosis not present

## 2022-11-10 DIAGNOSIS — Z930 Tracheostomy status: Secondary | ICD-10-CM | POA: Diagnosis not present

## 2022-11-10 DIAGNOSIS — F419 Anxiety disorder, unspecified: Secondary | ICD-10-CM | POA: Diagnosis not present

## 2022-11-10 DIAGNOSIS — E785 Hyperlipidemia, unspecified: Secondary | ICD-10-CM | POA: Diagnosis not present

## 2022-11-10 DIAGNOSIS — Z87891 Personal history of nicotine dependence: Secondary | ICD-10-CM | POA: Diagnosis not present

## 2022-11-10 DIAGNOSIS — I10 Essential (primary) hypertension: Secondary | ICD-10-CM | POA: Diagnosis not present

## 2022-11-10 DIAGNOSIS — R1310 Dysphagia, unspecified: Secondary | ICD-10-CM | POA: Diagnosis not present

## 2022-11-10 DIAGNOSIS — Z931 Gastrostomy status: Secondary | ICD-10-CM | POA: Diagnosis not present

## 2022-11-10 DIAGNOSIS — E039 Hypothyroidism, unspecified: Secondary | ICD-10-CM | POA: Diagnosis not present

## 2022-11-10 DIAGNOSIS — Z85818 Personal history of malignant neoplasm of other sites of lip, oral cavity, and pharynx: Secondary | ICD-10-CM | POA: Diagnosis not present

## 2022-11-10 DIAGNOSIS — K1379 Other lesions of oral mucosa: Secondary | ICD-10-CM | POA: Diagnosis not present

## 2022-11-10 DIAGNOSIS — D7389 Other diseases of spleen: Secondary | ICD-10-CM | POA: Diagnosis not present

## 2022-11-10 DIAGNOSIS — I420 Dilated cardiomyopathy: Secondary | ICD-10-CM | POA: Diagnosis not present

## 2022-11-10 DIAGNOSIS — D693 Immune thrombocytopenic purpura: Secondary | ICD-10-CM | POA: Diagnosis not present

## 2022-11-10 LAB — TYPE AND SCREEN
Antibody Screen: NEGATIVE
Unit division: 0

## 2022-11-10 LAB — BPAM RBC
ISSUE DATE / TIME: 202406220315
ISSUE DATE / TIME: 202406220504
ISSUE DATE / TIME: 202406222005
Unit Type and Rh: 6200

## 2022-11-11 DIAGNOSIS — Z931 Gastrostomy status: Secondary | ICD-10-CM | POA: Diagnosis not present

## 2022-11-11 DIAGNOSIS — R1310 Dysphagia, unspecified: Secondary | ICD-10-CM | POA: Diagnosis not present

## 2022-11-11 DIAGNOSIS — J701 Chronic and other pulmonary manifestations due to radiation: Secondary | ICD-10-CM | POA: Diagnosis not present

## 2022-11-11 DIAGNOSIS — E785 Hyperlipidemia, unspecified: Secondary | ICD-10-CM | POA: Diagnosis not present

## 2022-11-11 DIAGNOSIS — K1379 Other lesions of oral mucosa: Secondary | ICD-10-CM | POA: Diagnosis not present

## 2022-11-11 DIAGNOSIS — I420 Dilated cardiomyopathy: Secondary | ICD-10-CM | POA: Diagnosis not present

## 2022-11-11 DIAGNOSIS — I10 Essential (primary) hypertension: Secondary | ICD-10-CM | POA: Diagnosis not present

## 2022-11-11 DIAGNOSIS — F419 Anxiety disorder, unspecified: Secondary | ICD-10-CM | POA: Diagnosis not present

## 2022-11-11 DIAGNOSIS — E039 Hypothyroidism, unspecified: Secondary | ICD-10-CM | POA: Diagnosis not present

## 2022-11-11 DIAGNOSIS — Z85818 Personal history of malignant neoplasm of other sites of lip, oral cavity, and pharynx: Secondary | ICD-10-CM | POA: Diagnosis not present

## 2022-11-11 DIAGNOSIS — D693 Immune thrombocytopenic purpura: Secondary | ICD-10-CM | POA: Diagnosis not present

## 2022-11-11 DIAGNOSIS — Z87891 Personal history of nicotine dependence: Secondary | ICD-10-CM | POA: Diagnosis not present

## 2022-11-12 ENCOUNTER — Inpatient Hospital Stay: Payer: Medicare Other

## 2022-11-12 ENCOUNTER — Inpatient Hospital Stay: Payer: Medicare Other | Admitting: Oncology

## 2022-11-14 DIAGNOSIS — Z931 Gastrostomy status: Secondary | ICD-10-CM | POA: Diagnosis not present

## 2022-11-14 DIAGNOSIS — R042 Hemoptysis: Secondary | ICD-10-CM | POA: Diagnosis not present

## 2022-11-14 DIAGNOSIS — D693 Immune thrombocytopenic purpura: Secondary | ICD-10-CM | POA: Diagnosis not present

## 2022-11-14 DIAGNOSIS — Z930 Tracheostomy status: Secondary | ICD-10-CM | POA: Diagnosis not present

## 2022-11-15 ENCOUNTER — Other Ambulatory Visit: Payer: Self-pay

## 2022-11-16 ENCOUNTER — Other Ambulatory Visit: Payer: Self-pay

## 2022-11-16 ENCOUNTER — Inpatient Hospital Stay: Payer: Medicare Other

## 2022-11-16 ENCOUNTER — Inpatient Hospital Stay: Payer: Medicare Other | Attending: Oncology

## 2022-11-16 VITALS — BP 152/87 | HR 75 | Temp 98.4°F | Resp 16

## 2022-11-16 DIAGNOSIS — D693 Immune thrombocytopenic purpura: Secondary | ICD-10-CM

## 2022-11-16 DIAGNOSIS — C090 Malignant neoplasm of tonsillar fossa: Secondary | ICD-10-CM | POA: Diagnosis not present

## 2022-11-16 DIAGNOSIS — D649 Anemia, unspecified: Secondary | ICD-10-CM | POA: Insufficient documentation

## 2022-11-16 LAB — VITAMIN B12: Vitamin B-12: 1378 pg/mL — ABNORMAL HIGH (ref 180–914)

## 2022-11-16 LAB — TYPE AND SCREEN
ABO/RH(D): A POS
Antibody Screen: NEGATIVE

## 2022-11-16 LAB — CBC AND DIFFERENTIAL
HCT: 26 — AB (ref 41–53)
Hemoglobin: 8.6 — AB (ref 13.5–17.5)
Neutrophils Absolute: 12.81
Platelets: 235 10*3/uL (ref 150–400)
WBC: 14.9

## 2022-11-16 LAB — FOLATE: Folate: 24.1 ng/mL (ref >5.9)

## 2022-11-16 LAB — IRON AND TIBC
Iron: 50 ug/dL (ref 45–182)
Saturation Ratios: 15 % — ABNORMAL LOW (ref 17.9–39.5)
TIBC: 329 ug/dL (ref 250–450)
UIBC: 279 ug/dL

## 2022-11-16 LAB — FERRITIN: Ferritin: 93 ng/mL (ref 24–336)

## 2022-11-16 LAB — CBC: RBC: 2.74 — AB (ref 3.87–5.11)

## 2022-11-16 LAB — PREPARE RBC (CROSSMATCH)

## 2022-11-16 MED ORDER — SODIUM CHLORIDE 0.9 % IV SOLN: Freq: Once | INTRAVENOUS | Status: AC

## 2022-11-16 NOTE — Patient Instructions (Signed)

## 2022-11-16 NOTE — Progress Notes (Signed)
Patient difficult IV start. Per Dorothy Spark ok to leave PIV in for blood transfusion tomorrow.

## 2022-11-16 NOTE — Addendum Note (Signed)
Addended by: Domenic Schwab on: 11/16/2022 11:15 AM   Modules accepted: Orders

## 2022-11-17 ENCOUNTER — Inpatient Hospital Stay: Payer: Medicare Other

## 2022-11-17 ENCOUNTER — Other Ambulatory Visit: Payer: Self-pay | Admitting: Hematology and Oncology

## 2022-11-17 ENCOUNTER — Other Ambulatory Visit: Payer: Self-pay

## 2022-11-17 VITALS — BP 130/64 | HR 81 | Temp 98.9°F | Resp 18

## 2022-11-17 DIAGNOSIS — D693 Immune thrombocytopenic purpura: Secondary | ICD-10-CM | POA: Diagnosis not present

## 2022-11-17 DIAGNOSIS — D649 Anemia, unspecified: Secondary | ICD-10-CM | POA: Diagnosis not present

## 2022-11-17 DIAGNOSIS — C090 Malignant neoplasm of tonsillar fossa: Secondary | ICD-10-CM

## 2022-11-17 MED ORDER — SODIUM CHLORIDE 0.9% IV SOLUTION
250.0000 mL | Freq: Once | INTRAVENOUS | Status: AC
  Administered 2022-11-17: 250 mL via INTRAVENOUS

## 2022-11-17 MED ORDER — SODIUM CHLORIDE 0.9 % IV SOLN: 375.0000 mg/m2 | Freq: Once | INTRAVENOUS | Status: DC

## 2022-11-17 MED ORDER — FAMOTIDINE IN NACL 20-0.9 MG/50ML-% IV SOLN
20.0000 mg | Freq: Once | INTRAVENOUS | Status: AC
  Administered 2022-11-17: 20 mg via INTRAVENOUS
  Filled 2022-11-17: qty 50

## 2022-11-17 MED ORDER — DIPHENHYDRAMINE HCL 50 MG/ML IJ SOLN
50.0000 mg | Freq: Once | INTRAMUSCULAR | Status: DC
Start: 2022-11-17 — End: 2022-11-17

## 2022-11-17 MED ORDER — SODIUM CHLORIDE 0.9 % IV SOLN: Freq: Once | INTRAVENOUS | Status: DC

## 2022-11-17 MED ORDER — SODIUM CHLORIDE 0.9 % IV SOLN
500.0000 mL | Freq: Once | INTRAVENOUS | Status: AC
  Administered 2022-11-17: 500 mL via INTRAVENOUS

## 2022-11-17 MED ORDER — DIPHENHYDRAMINE HCL 50 MG/ML IJ SOLN
25.0000 mg | Freq: Once | INTRAMUSCULAR | Status: AC
  Administered 2022-11-17: 25 mg via INTRAVENOUS
  Filled 2022-11-17: qty 1

## 2022-11-17 MED ORDER — ACETAMINOPHEN 325 MG PO TABS
650.0000 mg | ORAL_TABLET | Freq: Once | ORAL | Status: DC
Start: 2022-11-17 — End: 2022-11-17

## 2022-11-17 MED ORDER — DIPHENHYDRAMINE HCL 25 MG PO CAPS
50.0000 mg | ORAL_CAPSULE | Freq: Once | ORAL | Status: DC
Start: 2022-11-17 — End: 2022-11-17

## 2022-11-17 MED ORDER — DIPHENHYDRAMINE HCL 50 MG/ML IJ SOLN: 25.0000 mg | Freq: Once | INTRAMUSCULAR | Status: DC

## 2022-11-17 NOTE — Patient Instructions (Signed)

## 2022-11-18 ENCOUNTER — Inpatient Hospital Stay: Payer: Medicare Other

## 2022-11-18 ENCOUNTER — Other Ambulatory Visit: Payer: Self-pay | Admitting: Pharmacist

## 2022-11-18 VITALS — BP 149/93 | HR 69 | Temp 98.3°F | Resp 18

## 2022-11-18 DIAGNOSIS — D649 Anemia, unspecified: Secondary | ICD-10-CM | POA: Diagnosis not present

## 2022-11-18 DIAGNOSIS — D693 Immune thrombocytopenic purpura: Secondary | ICD-10-CM | POA: Diagnosis not present

## 2022-11-18 LAB — TYPE AND SCREEN: Unit division: 0

## 2022-11-18 LAB — BPAM RBC
Blood Product Expiration Date: 202407252359
ISSUE DATE / TIME: 202407020817
Unit Type and Rh: 6200

## 2022-11-18 MED ORDER — ROMIPLOSTIM 125 MCG ~~LOC~~ SOLR
4.2000 ug/kg | Freq: Once | SUBCUTANEOUS | Status: AC
  Administered 2022-11-18: 375 ug via SUBCUTANEOUS
  Filled 2022-11-18: qty 0.25

## 2022-11-18 NOTE — Progress Notes (Signed)
..  Rapid Infusion Rituximab Pharmacist Evaluation  William Bell is a 72 y.o. male being treated with rituximab for ITP. This patient may be considered for RIR.   A pharmacist has verified the patient tolerated rituximab infusions per the [facility] standard infusion protocol without grade 3-4 infusion reactions. The treatment plan will be updated to reflect RIR if the patient qualifies per the checklist below:   Age > 36 years old Yes   Clinically significant cardiovascular disease No   Circulating lymphocyte count < 5000/uL prior to cycle two Yes  Lab Results  Component Value Date   LYMPHSABS 0.3 (L) 11/07/2022    Prior documented grade 3-4 infusion reaction to rituximab No   Prior documented grade 1-2 infusion reaction to rituximab (If YES, Pharmacist will confirm with Physician if patient is still a candidate for RIR) No   Previous rituximab infusion within the past 6 months Yes   Treatment Plan updated orders to reflect RIR Yes    [name] does meet the criteria for Rapid Infusion Rituximab. This patient is going to be switched to rapid infusion rituximab.   [name] 11/18/22 11:21 AM

## 2022-11-18 NOTE — Patient Instructions (Signed)
Romiplostim Injection What is this medication? ROMIPLOSTIM (roe mi PLOE stim) treats low levels of platelets in your body caused by immune thrombocytopenia (ITP). It is prescribed when other medications have not worked or cannot be tolerated. It may also be used to help people who have been exposed to high doses of radiation. It works by increasing the amount of platelets in your blood. This lowers the risk of bleeding. This medicine may be used for other purposes; ask your health care provider or pharmacist if you have questions. COMMON BRAND NAME(S): Nplate What should I tell my care team before I take this medication? They need to know if you have any of these conditions: Blood clots Myelodysplastic syndrome An unusual or allergic reaction to romiplostim, mannitol, other medications, foods, dyes, or preservatives Pregnant or trying to get pregnant Breast-feeding How should I use this medication? This medication is injected under the skin. It is given by a care team in a hospital or clinic setting. A special MedGuide will be given to you before each treatment. Be sure to read this information carefully each time. Talk to your care team about the use of this medication in children. While it may be prescribed for children as young as newborns for selected conditions, precautions do apply. Overdosage: If you think you have taken too much of this medicine contact a poison control center or emergency room at once. NOTE: This medicine is only for you. Do not share this medicine with others. What if I miss a dose? Keep appointments for follow-up doses. It is important not to miss your dose. Call your care team if you are unable to keep an appointment. What may interact with this medication? Interactions are not expected. This list may not describe all possible interactions. Give your health care provider a list of all the medicines, herbs, non-prescription drugs, or dietary supplements you use. Also  tell them if you smoke, drink alcohol, or use illegal drugs. Some items may interact with your medicine. What should I watch for while using this medication? Visit your care team for regular checks on your progress. You may need blood work done while you are taking this medication. Your condition will be monitored carefully while you are receiving this medication. It is important not to miss any appointments. What side effects may I notice from receiving this medication? Side effects that you should report to your care team as soon as possible: Allergic reactions--skin rash, itching, hives, swelling of the face, lips, tongue, or throat Blood clot--pain, swelling, or warmth in the leg, shortness of breath, chest pain Side effects that usually do not require medical attention (report to your care team if they continue or are bothersome): Dizziness Joint pain Muscle pain Pain in the hands or feet Stomach pain Trouble sleeping This list may not describe all possible side effects. Call your doctor for medical advice about side effects. You may report side effects to FDA at 1-800-FDA-1088. Where should I keep my medication? This medication is given in a hospital or clinic. It will not be stored at home. NOTE: This sheet is a summary. It may not cover all possible information. If you have questions about this medicine, talk to your doctor, pharmacist, or health care provider.  2024 Elsevier/Gold Standard (2021-09-08 00:00:00)  

## 2022-11-20 ENCOUNTER — Telehealth: Payer: Self-pay

## 2022-11-20 ENCOUNTER — Inpatient Hospital Stay: Payer: Medicare Other

## 2022-11-20 VITALS — BP 138/87 | HR 70 | Temp 98.6°F | Resp 18 | Ht 68.0 in | Wt 194.0 lb

## 2022-11-20 DIAGNOSIS — D693 Immune thrombocytopenic purpura: Secondary | ICD-10-CM | POA: Diagnosis not present

## 2022-11-20 DIAGNOSIS — D649 Anemia, unspecified: Secondary | ICD-10-CM | POA: Diagnosis not present

## 2022-11-20 MED ORDER — SODIUM CHLORIDE 0.9 % IV SOLN
375.0000 mg/m2 | Freq: Once | INTRAVENOUS | Status: AC
  Administered 2022-11-20: 800 mg via INTRAVENOUS
  Filled 2022-11-20: qty 50

## 2022-11-20 MED ORDER — DIPHENHYDRAMINE HCL 50 MG/ML IJ SOLN: 50.0000 mg | Freq: Once | INTRAMUSCULAR | Status: DC

## 2022-11-20 MED ORDER — SODIUM CHLORIDE 0.9 % IV SOLN: Freq: Once | INTRAVENOUS | Status: AC

## 2022-11-20 MED ORDER — ACETAMINOPHEN 160 MG/5ML PO SOLN
650.0000 mg | Freq: Once | ORAL | Status: DC
  Filled 2022-11-20: qty 20.3

## 2022-11-20 MED ORDER — FAMOTIDINE IN NACL 20-0.9 MG/50ML-% IV SOLN: 20.0000 mg | Freq: Once | INTRAVENOUS | Status: DC

## 2022-11-20 NOTE — Telephone Encounter (Signed)
Dr Melvyn Neth agreeable to give order for PT eval & treat.

## 2022-11-20 NOTE — Patient Instructions (Signed)
Rituximab; Hyaluronidase Injection What is this medication? RITUXIMAB; HYALURONIDASE (ri TUX i mab; hye al ur ON i dase) treats leukemia and lymphoma. Rituximab works by blocking a protein that causes cancer cells to grow and multiply. This helps to slow or stop the spread of cancer cells. Hyaluronidase works by increasing the absorption of the other medications in the body to help them work better. It is a combination medication that contains a monoclonal antibody. This medicine may be used for other purposes; ask your health care provider or pharmacist if you have questions. COMMON BRAND NAME(S): Rituxan Hycela What should I tell my care team before I take this medication? They need to know if you have any of these conditions: Heart disease Immune system problems Infection, such as hepatitis B, chickenpox, cold sores, herpes Irregular heartbeat Kidney disease Lung or breathing disease, such as asthma Recently received or scheduled to receive a vaccine An unusual or allergic reaction to rituximab, rituximab;hyaluronidase, mouse proteins, other medications, foods, dyes, or preservatives Pregnant or trying to get pregnant Breast-feeding How should I use this medication? This medication is for injection under the skin. It is given by a care team in a hospital or clinic setting. A special MedGuide will be given to you before each treatment. Be sure to read this information carefully each time. Talk to your care team about the use of this medication in children. Special care may be needed. Overdosage: If you think you have taken too much of this medicine contact a poison control center or emergency room at once. NOTE: This medicine is only for you. Do not share this medicine with others. What if I miss a dose? Keep appointments for follow-up doses. It is important not to miss your dose. Call your care team if you are unable to keep an appointment. What may interact with this medication? Do not  take this medication with any of the following: Live virus vaccines This medication may also interact with the following: Cisplatin This list may not describe all possible interactions. Give your health care provider a list of all the medicines, herbs, non-prescription drugs, or dietary supplements you use. Also tell them if you smoke, drink alcohol, or use illegal drugs. Some items may interact with your medicine. What should I watch for while using this medication? Your condition will be monitored carefully while you are receiving this medication. You may need blood work while taking this medication. This medication can cause serious allergic reactions. To reduce the risk, your care team may give you other medications to take before receiving this one. Be sure to follow the directions from your care team. In some patients, this medication may cause a serious brain infection that may cause death. If you have any problems seeing, thinking, speaking, walking, or standing, tell your care team right away. If you cannot reach your care team, urgently seek other source of medical care. This medication may increase your risk of getting an infection. Call your care team for advice if you get a fever, chills, sore throat, or other symptoms of a cold or flu. Do not treat yourself. Try to avoid being around people who are sick. Talk to your care team if you may be pregnant. Serious birth defects can occur if you take this medication during pregnancy and for 12 months after the last dose. Your will need a negative pregnancy test before starting this medication. Contraception is recommended while taking this medication and for 12 months after the last dose. Your care  team can help you find the option that works for you. Do not breastfeed while taking this medication and for at least 6 months after the last dose. What side effects may I notice from receiving this medication? Side effects that you should report to  your care team as soon as possible: Allergic reactions or angioedema--skin rash, itching or hives, swelling of the face, eyes, lips, tongue, arms, or legs, trouble swallowing or breathing Bowel blockage--stomach cramping, unable to have a bowel movement or pass gas, loss of appetite, vomiting Dizziness, loss of balance or coordination, confusion or trouble speaking Fever, chills, unusual weakness or fatigue, loss of appetite, nausea, headache, dizziness, feeling faint or lightheaded, shortness of breath, fast or irregular heartbeat, which may be signs of cytokine release syndrome Heart attack--pain or tightness in the chest, shoulders, arms, or jaw, nausea, shortness of breath, cold or clammy skin, feeling faint or lightheaded Heart rhythm changes--fast or irregular heartbeat, dizziness, feeling faint or lightheaded, chest pain, trouble breathing Infection--fever, chills, cough, sore throat, wounds that don't heal, pain or trouble when passing urine, general feeling of discomfort or being unwell Kidney injury--decrease in the amount of urine, swelling of the ankles, hands, or feet Liver injury--right upper belly pain, loss of appetite, nausea, light-colored stool, dark yellow or brown urine, yellowing skin or eyes, unusual weakness or fatigue Redness, blistering, peeling, or loosening of the skin, including inside the mouth Stomach pain that is severe, does not go away, or gets worse Tumor lysis syndrome (TLS)--nausea, vomiting, diarrhea, decrease in the amount of urine, dark urine, unusual weakness or fatigue, confusion, muscle pain or cramps, fast or irregular heartbeat, joint pain Side effects that usually do not require medical attention (report these to your care team if they continue or are bothersome): Constipation Fatigue Hair loss Nausea Pain, redness, or irritation at injection site This list may not describe all possible side effects. Call your doctor for medical advice about side  effects. You may report side effects to FDA at 1-800-FDA-1088. Where should I keep my medication? This medication is given in a hospital or clinic. It will not be stored at home. NOTE: This sheet is a summary. It may not cover all possible information. If you have questions about this medicine, talk to your doctor, pharmacist, or health care provider.  2024 Elsevier/Gold Standard (2021-09-25 00:00:00)

## 2022-11-24 NOTE — Progress Notes (Signed)
[facility]  [address] [place],  [place]  [address] [phone]  Clinic Day:  11/25/2022  Referring physician: Weston Settle, MD  HISTORY OF PRESENT ILLNESS:  The patient is a 72 y.o. male with splenectomy-refractory ITP.  The patient recently had a severe relapse of his ITP.  Over these past weeks, he has received weekly Nplate and Rituxan, which have been effective in returning his platelets to ideal levels.  He is scheduled for his 4th and final week of weekly Rituxan this week.  Overall, the patient is clinically doing much better.  He denies having any subcutaneous bleeding/bruising issues which concern him for a return of his severe thrombocytopenia.  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  The patient completed radiation for this cancer in April 2017.  PHYSICAL EXAM:  Blood pressure 129/72, pulse 77, temperature 97.6 F (36.4 C), resp. rate 16, height 5\' 8"  (1.727 m), weight 187 lb 4.8 oz (85 kg), SpO2 91 %. Body mass index is 28.48 kg/m. Performance status (ECOG): 1 - Symptomatic but completely ambulatory Physical Exam Constitutional:      General: He is not in acute distress.    Appearance: Normal appearance. He is normal weight.  HENT:     Head: Normocephalic and atraumatic.     Nose:     Right Nostril: No epistaxis.     Left Nostril: No epistaxis.  Eyes:     General: No scleral icterus.    Extraocular Movements: Extraocular movements intact.     Conjunctiva/sclera: Conjunctivae normal.     Pupils: Pupils are equal, round, and reactive to light.  Cardiovascular:     Rate and Rhythm: Normal rate and regular rhythm.     Pulses: Normal pulses.     Heart sounds: Normal heart sounds. No murmur heard.    No friction rub. No gallop.  Pulmonary:     Effort: Pulmonary effort is normal. No respiratory distress.     Breath sounds: Normal breath sounds.   Abdominal:     General: Bowel sounds are normal. There is no distension.     Palpations: Abdomen is soft. There is no hepatomegaly, splenomegaly or mass.     Tenderness: There is no abdominal tenderness.  Musculoskeletal:        General: Normal range of motion.     Cervical back: Normal range of motion and neck supple.     Right lower leg: No edema.     Left lower leg: No edema.  Lymphadenopathy:     Cervical: No cervical adenopathy.  Skin:    General: Skin is warm and dry.     Findings: Bruising (right chin ecchymosis) present.  Neurological:     General: No focal deficit present.     Mental Status: He is alert and oriented to person, place, and time. Mental status is at baseline.  Psychiatric:        Mood and Affect: Mood normal.        Behavior: Behavior normal.        Thought Content: Thought content normal.        Judgment: Judgment normal.   LABS:     Latest Reference Range & Units 11/16/22 15:00  Iron 45 - 182 ug/dL 50  UIBC ug/dL 542  TIBC 706 - 237 ug/dL 628  Saturation Ratios 17.9 - 39.5 % 15 (L)  Ferritin 24 -  336 ng/mL 93  Folate >5.9 ng/mL 24.1  (L): Data is abnormally low  ASSESSMENT & PLAN:  Assessment/Plan:  A 72 y.o. male with a recent recurrence of severe, splenectomy-refractory ITP.  I am very pleased as his platelet count is 670 today.  As it is above 400, he does not need Nplate this week.  He will proceed with his fourth and final cycle of Rituxan this week.  Moving forward, the plan will be to check his CBC weekly to ensure there is not a precipitous fall in his platelets.  On another note, he is anemic today, but he does not appear to have any nutritional deficiencies.  Clinically, he is doing much better.  I will see this patient back in 8 weeks for repeat clinical assessment.  The patient understands all the plans discussed today and knows to contact our office if he runs into any problems that require immediate clinical attention.    Samia Kukla Kirby Funk, MD

## 2022-11-25 ENCOUNTER — Inpatient Hospital Stay: Payer: Medicare Other

## 2022-11-25 ENCOUNTER — Other Ambulatory Visit: Payer: Self-pay | Admitting: Oncology

## 2022-11-25 ENCOUNTER — Inpatient Hospital Stay (INDEPENDENT_AMBULATORY_CARE_PROVIDER_SITE_OTHER): Payer: Medicare Other | Admitting: Oncology

## 2022-11-25 DIAGNOSIS — Z6829 Body mass index (BMI) 29.0-29.9, adult: Secondary | ICD-10-CM | POA: Diagnosis not present

## 2022-11-25 DIAGNOSIS — D693 Immune thrombocytopenic purpura: Secondary | ICD-10-CM

## 2022-11-25 DIAGNOSIS — D649 Anemia, unspecified: Secondary | ICD-10-CM | POA: Diagnosis not present

## 2022-11-25 DIAGNOSIS — F5102 Adjustment insomnia: Secondary | ICD-10-CM | POA: Diagnosis not present

## 2022-11-25 LAB — BASIC METABOLIC PANEL
BUN: 29 — AB (ref 4–21)
CO2: 25 — AB (ref 13–22)
Chloride: 100 (ref 99–108)
Creatinine: 0.7 (ref 0.6–1.3)
EGFR: 60
Glucose: 99
Potassium: 3.9 mEq/L (ref 3.5–5.1)
Sodium: 133 — AB (ref 137–147)

## 2022-11-25 LAB — COMPREHENSIVE METABOLIC PANEL
Albumin: 3.7 (ref 3.5–5.0)
Calcium: 8.7 (ref 8.7–10.7)

## 2022-11-25 LAB — CBC AND DIFFERENTIAL
HCT: 29 — AB (ref 41–53)
Hemoglobin: 9.6 — AB (ref 13.5–17.5)
Neutrophils Absolute: 7.98
Platelets: 670 10*3/uL — AB (ref 150–400)
WBC: 10.1

## 2022-11-25 LAB — HEPATIC FUNCTION PANEL
ALT: 17 U/L (ref 10–40)
AST: 61 — AB (ref 14–40)
Alkaline Phosphatase: 60 (ref 25–125)
Bilirubin, Total: 0.7

## 2022-11-25 LAB — CBC: RBC: 3.18 — AB (ref 3.87–5.11)

## 2022-11-26 ENCOUNTER — Other Ambulatory Visit: Payer: Self-pay

## 2022-11-26 ENCOUNTER — Encounter: Payer: Self-pay | Admitting: Oncology

## 2022-11-27 ENCOUNTER — Inpatient Hospital Stay: Payer: Medicare Other

## 2022-11-27 VITALS — BP 156/88 | HR 74 | Temp 98.1°F | Resp 18 | Ht 68.0 in | Wt 186.0 lb

## 2022-11-27 DIAGNOSIS — D693 Immune thrombocytopenic purpura: Secondary | ICD-10-CM

## 2022-11-27 DIAGNOSIS — D649 Anemia, unspecified: Secondary | ICD-10-CM | POA: Diagnosis not present

## 2022-11-27 MED ORDER — SODIUM CHLORIDE 0.9% FLUSH: 10.0000 mL | INTRAVENOUS | Status: DC | PRN

## 2022-11-27 MED ORDER — SODIUM CHLORIDE 0.9 % IV SOLN
375.0000 mg/m2 | Freq: Once | INTRAVENOUS | Status: AC
  Administered 2022-11-27: 800 mg via INTRAVENOUS
  Filled 2022-11-27: qty 50

## 2022-11-27 MED ORDER — SODIUM CHLORIDE 0.9 % IV SOLN: Freq: Once | INTRAVENOUS | Status: AC

## 2022-11-27 MED ORDER — FAMOTIDINE IN NACL 20-0.9 MG/50ML-% IV SOLN: 20.0000 mg | Freq: Once | INTRAVENOUS | Status: DC

## 2022-11-27 MED ORDER — ACETAMINOPHEN 160 MG/5ML PO SOLN
650.0000 mg | Freq: Once | ORAL | Status: DC
  Filled 2022-11-27: qty 20.3

## 2022-11-27 MED ORDER — DIPHENHYDRAMINE HCL 50 MG/ML IJ SOLN: 50.0000 mg | Freq: Once | INTRAMUSCULAR | Status: DC

## 2022-11-27 MED ORDER — HEPARIN SOD (PORK) LOCK FLUSH 100 UNIT/ML IV SOLN: 500.0000 [IU] | Freq: Once | INTRAVENOUS | Status: DC | PRN

## 2022-11-27 NOTE — Patient Instructions (Signed)
Rituximab; Hyaluronidase Injection What is this medication? RITUXIMAB; HYALURONIDASE (ri TUX i mab; hye al ur ON i dase) treats leukemia and lymphoma. Rituximab works by blocking a protein that causes cancer cells to grow and multiply. This helps to slow or stop the spread of cancer cells. Hyaluronidase works by increasing the absorption of the other medications in the body to help them work better. It is a combination medication that contains a monoclonal antibody. This medicine may be used for other purposes; ask your health care provider or pharmacist if you have questions. COMMON BRAND NAME(S): Rituxan Hycela What should I tell my care team before I take this medication? They need to know if you have any of these conditions: Heart disease Immune system problems Infection, such as hepatitis B, chickenpox, cold sores, herpes Irregular heartbeat Kidney disease Lung or breathing disease, such as asthma Recently received or scheduled to receive a vaccine An unusual or allergic reaction to rituximab, rituximab;hyaluronidase, mouse proteins, other medications, foods, dyes, or preservatives Pregnant or trying to get pregnant Breast-feeding How should I use this medication? This medication is for injection under the skin. It is given by a care team in a hospital or clinic setting. A special MedGuide will be given to you before each treatment. Be sure to read this information carefully each time. Talk to your care team about the use of this medication in children. Special care may be needed. Overdosage: If you think you have taken too much of this medicine contact a poison control center or emergency room at once. NOTE: This medicine is only for you. Do not share this medicine with others. What if I miss a dose? Keep appointments for follow-up doses. It is important not to miss your dose. Call your care team if you are unable to keep an appointment. What may interact with this medication? Do not  take this medication with any of the following: Live virus vaccines This medication may also interact with the following: Cisplatin This list may not describe all possible interactions. Give your health care provider a list of all the medicines, herbs, non-prescription drugs, or dietary supplements you use. Also tell them if you smoke, drink alcohol, or use illegal drugs. Some items may interact with your medicine. What should I watch for while using this medication? Your condition will be monitored carefully while you are receiving this medication. You may need blood work while taking this medication. This medication can cause serious allergic reactions. To reduce the risk, your care team may give you other medications to take before receiving this one. Be sure to follow the directions from your care team. In some patients, this medication may cause a serious brain infection that may cause death. If you have any problems seeing, thinking, speaking, walking, or standing, tell your care team right away. If you cannot reach your care team, urgently seek other source of medical care. This medication may increase your risk of getting an infection. Call your care team for advice if you get a fever, chills, sore throat, or other symptoms of a cold or flu. Do not treat yourself. Try to avoid being around people who are sick. Talk to your care team if you may be pregnant. Serious birth defects can occur if you take this medication during pregnancy and for 12 months after the last dose. Your will need a negative pregnancy test before starting this medication. Contraception is recommended while taking this medication and for 12 months after the last dose. Your care   team can help you find the option that works for you. Do not breastfeed while taking this medication and for at least 6 months after the last dose. What side effects may I notice from receiving this medication? Side effects that you should report to  your care team as soon as possible: Allergic reactions or angioedema--skin rash, itching or hives, swelling of the face, eyes, lips, tongue, arms, or legs, trouble swallowing or breathing Bowel blockage--stomach cramping, unable to have a bowel movement or pass gas, loss of appetite, vomiting Dizziness, loss of balance or coordination, confusion or trouble speaking Fever, chills, unusual weakness or fatigue, loss of appetite, nausea, headache, dizziness, feeling faint or lightheaded, shortness of breath, fast or irregular heartbeat, which may be signs of cytokine release syndrome Heart attack--pain or tightness in the chest, shoulders, arms, or jaw, nausea, shortness of breath, cold or clammy skin, feeling faint or lightheaded Heart rhythm changes--fast or irregular heartbeat, dizziness, feeling faint or lightheaded, chest pain, trouble breathing Infection--fever, chills, cough, sore throat, wounds that don't heal, pain or trouble when passing urine, general feeling of discomfort or being unwell Kidney injury--decrease in the amount of urine, swelling of the ankles, hands, or feet Liver injury--right upper belly pain, loss of appetite, nausea, light-colored stool, dark yellow or brown urine, yellowing skin or eyes, unusual weakness or fatigue Redness, blistering, peeling, or loosening of the skin, including inside the mouth Stomach pain that is severe, does not go away, or gets worse Tumor lysis syndrome (TLS)--nausea, vomiting, diarrhea, decrease in the amount of urine, dark urine, unusual weakness or fatigue, confusion, muscle pain or cramps, fast or irregular heartbeat, joint pain Side effects that usually do not require medical attention (report these to your care team if they continue or are bothersome): Constipation Fatigue Hair loss Nausea Pain, redness, or irritation at injection site This list may not describe all possible side effects. Call your doctor for medical advice about side  effects. You may report side effects to FDA at 1-800-FDA-1088. Where should I keep my medication? This medication is given in a hospital or clinic. It will not be stored at home. NOTE: This sheet is a summary. It may not cover all possible information. If you have questions about this medicine, talk to your doctor, pharmacist, or health care provider.  2024 Elsevier/Gold Standard (2021-09-25 00:00:00)  

## 2022-11-29 ENCOUNTER — Encounter: Payer: Self-pay | Admitting: Oncology

## 2022-12-02 ENCOUNTER — Inpatient Hospital Stay: Payer: Medicare Other

## 2022-12-02 ENCOUNTER — Ambulatory Visit: Payer: Medicare Other | Admitting: Oncology

## 2022-12-02 DIAGNOSIS — D693 Immune thrombocytopenic purpura: Secondary | ICD-10-CM | POA: Diagnosis not present

## 2022-12-02 DIAGNOSIS — C090 Malignant neoplasm of tonsillar fossa: Secondary | ICD-10-CM | POA: Diagnosis not present

## 2022-12-02 DIAGNOSIS — D649 Anemia, unspecified: Secondary | ICD-10-CM | POA: Diagnosis not present

## 2022-12-02 LAB — CBC AND DIFFERENTIAL
HCT: 29 — AB (ref 41–53)
Hemoglobin: 9.6 — AB (ref 13.5–17.5)
Neutrophils Absolute: 6.21
Platelets: 1609 10*3/uL — AB (ref 150–400)
WBC: 9

## 2022-12-02 LAB — CBC: RBC: 3.16 — AB (ref 3.87–5.11)

## 2022-12-02 NOTE — Progress Notes (Signed)
CRITICAL VALUE STICKER  CRITICAL VALUE:  Plt [id number]  RECEIVER (on-site recipient of call):  Dyane Dustman RN   DATE & TIME NOTIFIED:   12/02/2022 @ [phone]  MESSENGER (representative from lab):  [facility]  MD NOTIFIED:   Dr. Melvyn Neth  TIME OF NOTIFICATION:  [phone]  RESPONSE:  Noted

## 2022-12-04 ENCOUNTER — Ambulatory Visit: Payer: Medicare Other

## 2022-12-04 DIAGNOSIS — I420 Dilated cardiomyopathy: Secondary | ICD-10-CM | POA: Diagnosis not present

## 2022-12-04 DIAGNOSIS — I10 Essential (primary) hypertension: Secondary | ICD-10-CM | POA: Diagnosis not present

## 2022-12-04 DIAGNOSIS — Z931 Gastrostomy status: Secondary | ICD-10-CM | POA: Diagnosis not present

## 2022-12-08 ENCOUNTER — Encounter: Payer: Self-pay | Admitting: Oncology

## 2022-12-09 ENCOUNTER — Encounter: Payer: Self-pay | Admitting: Oncology

## 2022-12-09 ENCOUNTER — Inpatient Hospital Stay: Payer: Medicare Other

## 2022-12-09 DIAGNOSIS — D693 Immune thrombocytopenic purpura: Secondary | ICD-10-CM | POA: Diagnosis not present

## 2022-12-09 DIAGNOSIS — D649 Anemia, unspecified: Secondary | ICD-10-CM | POA: Diagnosis not present

## 2022-12-09 LAB — CBC AND DIFFERENTIAL
HCT: 30 — AB (ref 41–53)
Hemoglobin: 9.7 — AB (ref 13.5–17.5)
Neutrophils Absolute: 6.81
Platelets: 1386 10*3/uL — AB (ref 150–400)
WBC: 9.2

## 2022-12-09 LAB — CBC: RBC: 3.26 — AB (ref 3.87–5.11)

## 2022-12-09 NOTE — Progress Notes (Signed)
CRITICAL VALUE STICKER  CRITICAL VALUE:  Plt 1386  RECEIVER (on-site recipient of call):  Dyane Dustman RN  DATE & TIME NOTIFIED:   12/09/2022 @ 1036  MESSENGER (representative from lab):  [facility]  MD NOTIFIED:   Dr. Melvyn Neth  TIME OF NOTIFICATION:  1037  RESPONSE:   No treatment required.

## 2022-12-16 ENCOUNTER — Inpatient Hospital Stay: Payer: Medicare Other

## 2022-12-16 DIAGNOSIS — D649 Anemia, unspecified: Secondary | ICD-10-CM | POA: Diagnosis not present

## 2022-12-16 DIAGNOSIS — D693 Immune thrombocytopenic purpura: Secondary | ICD-10-CM | POA: Diagnosis not present

## 2022-12-16 LAB — CBC WITH DIFFERENTIAL (CANCER CENTER ONLY)
Abs Immature Granulocytes: 0.04 10*3/uL (ref 0.00–0.07)
Basophils Absolute: 0.1 10*3/uL (ref 0.0–0.1)
Basophils Relative: 1 %
Eosinophils Absolute: 0.1 10*3/uL (ref 0.0–0.5)
Eosinophils Relative: 1 %
HCT: 31 % — ABNORMAL LOW (ref 39.0–52.0)
Hemoglobin: 9.6 g/dL — ABNORMAL LOW (ref 13.0–17.0)
Immature Granulocytes: 0 %
Lymphocytes Relative: 7 %
Lymphs Abs: 0.7 10*3/uL (ref 0.7–4.0)
MCH: 28.7 pg (ref 26.0–34.0)
MCHC: 31 g/dL (ref 30.0–36.0)
MCV: 92.8 fL (ref 80.0–100.0)
Monocytes Absolute: 1.1 10*3/uL — ABNORMAL HIGH (ref 0.1–1.0)
Monocytes Relative: 11 %
Neutro Abs: 7.8 10*3/uL — ABNORMAL HIGH (ref 1.7–7.7)
Neutrophils Relative %: 80 %
Platelet Count: 610 10*3/uL — ABNORMAL HIGH (ref 150–400)
RBC: 3.34 MIL/uL — ABNORMAL LOW (ref 4.22–5.81)
RDW: 15.5 % (ref 11.5–15.5)
WBC Count: 9.7 10*3/uL (ref 4.0–10.5)
nRBC: 0.4 % — ABNORMAL HIGH (ref 0.0–0.2)

## 2022-12-23 ENCOUNTER — Inpatient Hospital Stay: Payer: Medicare Other | Attending: Oncology

## 2022-12-23 DIAGNOSIS — D693 Immune thrombocytopenic purpura: Secondary | ICD-10-CM | POA: Insufficient documentation

## 2022-12-23 LAB — CBC WITH DIFFERENTIAL (CANCER CENTER ONLY)
Abs Immature Granulocytes: 0.03 10*3/uL (ref 0.00–0.07)
Basophils Absolute: 0.1 10*3/uL (ref 0.0–0.1)
Basophils Relative: 1 %
Eosinophils Absolute: 0.3 10*3/uL (ref 0.0–0.5)
Eosinophils Relative: 3 %
HCT: 32.7 % — ABNORMAL LOW (ref 39.0–52.0)
Hemoglobin: 10.4 g/dL — ABNORMAL LOW (ref 13.0–17.0)
Immature Granulocytes: 0 %
Lymphocytes Relative: 7 %
Lymphs Abs: 0.7 10*3/uL (ref 0.7–4.0)
MCH: 29.3 pg (ref 26.0–34.0)
MCHC: 31.8 g/dL (ref 30.0–36.0)
MCV: 92.1 fL (ref 80.0–100.0)
Monocytes Absolute: 1.4 10*3/uL — ABNORMAL HIGH (ref 0.1–1.0)
Monocytes Relative: 13 %
Neutro Abs: 8.2 10*3/uL — ABNORMAL HIGH (ref 1.7–7.7)
Neutrophils Relative %: 76 %
Platelet Count: 354 10*3/uL (ref 150–400)
RBC: 3.55 MIL/uL — ABNORMAL LOW (ref 4.22–5.81)
RDW: 15.2 % (ref 11.5–15.5)
WBC Count: 10.8 10*3/uL — ABNORMAL HIGH (ref 4.0–10.5)
nRBC: 0 % (ref 0.0–0.2)

## 2022-12-30 ENCOUNTER — Inpatient Hospital Stay: Payer: Medicare Other

## 2022-12-30 DIAGNOSIS — D693 Immune thrombocytopenic purpura: Secondary | ICD-10-CM

## 2022-12-30 LAB — CBC WITH DIFFERENTIAL (CANCER CENTER ONLY)
Abs Immature Granulocytes: 0.02 10*3/uL (ref 0.00–0.07)
Basophils Absolute: 0.1 10*3/uL (ref 0.0–0.1)
Basophils Relative: 1 %
Eosinophils Absolute: 0.3 10*3/uL (ref 0.0–0.5)
Eosinophils Relative: 4 %
HCT: 34.7 % — ABNORMAL LOW (ref 39.0–52.0)
Hemoglobin: 10.9 g/dL — ABNORMAL LOW (ref 13.0–17.0)
Immature Granulocytes: 0 %
Lymphocytes Relative: 8 %
Lymphs Abs: 0.6 10*3/uL — ABNORMAL LOW (ref 0.7–4.0)
MCH: 28.5 pg (ref 26.0–34.0)
MCHC: 31.4 g/dL (ref 30.0–36.0)
MCV: 90.8 fL (ref 80.0–100.0)
Monocytes Absolute: 1 10*3/uL (ref 0.1–1.0)
Monocytes Relative: 13 %
Neutro Abs: 5.8 10*3/uL (ref 1.7–7.7)
Neutrophils Relative %: 74 %
Platelet Count: 297 10*3/uL (ref 150–400)
RBC: 3.82 MIL/uL — ABNORMAL LOW (ref 4.22–5.81)
RDW: 15.4 % (ref 11.5–15.5)
WBC Count: 7.8 10*3/uL (ref 4.0–10.5)
nRBC: 0 % (ref 0.0–0.2)

## 2023-01-06 ENCOUNTER — Inpatient Hospital Stay: Payer: Medicare Other

## 2023-01-06 DIAGNOSIS — D693 Immune thrombocytopenic purpura: Secondary | ICD-10-CM | POA: Diagnosis not present

## 2023-01-06 LAB — CBC WITH DIFFERENTIAL (CANCER CENTER ONLY)
Abs Immature Granulocytes: 0.1 10*3/uL — ABNORMAL HIGH (ref 0.00–0.07)
Basophils Absolute: 0.1 10*3/uL (ref 0.0–0.1)
Basophils Relative: 1 %
Eosinophils Absolute: 0.3 10*3/uL (ref 0.0–0.5)
Eosinophils Relative: 4 %
HCT: 34.6 % — ABNORMAL LOW (ref 39.0–52.0)
Hemoglobin: 10.8 g/dL — ABNORMAL LOW (ref 13.0–17.0)
Immature Granulocytes: 1 %
Lymphocytes Relative: 7 %
Lymphs Abs: 0.6 10*3/uL — ABNORMAL LOW (ref 0.7–4.0)
MCH: 28.3 pg (ref 26.0–34.0)
MCHC: 31.2 g/dL (ref 30.0–36.0)
MCV: 90.8 fL (ref 80.0–100.0)
Monocytes Absolute: 0.8 10*3/uL (ref 0.1–1.0)
Monocytes Relative: 10 %
Neutro Abs: 6 10*3/uL (ref 1.7–7.7)
Neutrophils Relative %: 77 %
Platelet Count: 312 10*3/uL (ref 150–400)
RBC: 3.81 MIL/uL — ABNORMAL LOW (ref 4.22–5.81)
RDW: 15.4 % (ref 11.5–15.5)
WBC Count: 7.8 10*3/uL (ref 4.0–10.5)
nRBC: 0 % (ref 0.0–0.2)

## 2023-01-13 ENCOUNTER — Inpatient Hospital Stay: Payer: Medicare Other

## 2023-01-13 DIAGNOSIS — D693 Immune thrombocytopenic purpura: Secondary | ICD-10-CM

## 2023-01-13 LAB — CBC WITH DIFFERENTIAL (CANCER CENTER ONLY)
Abs Immature Granulocytes: 0.02 10*3/uL (ref 0.00–0.07)
Basophils Absolute: 0.1 10*3/uL (ref 0.0–0.1)
Basophils Relative: 1 %
Eosinophils Absolute: 0.3 10*3/uL (ref 0.0–0.5)
Eosinophils Relative: 5 %
HCT: 37.1 % — ABNORMAL LOW (ref 39.0–52.0)
Hemoglobin: 11.8 g/dL — ABNORMAL LOW (ref 13.0–17.0)
Immature Granulocytes: 0 %
Lymphocytes Relative: 9 %
Lymphs Abs: 0.6 10*3/uL — ABNORMAL LOW (ref 0.7–4.0)
MCH: 28.6 pg (ref 26.0–34.0)
MCHC: 31.8 g/dL (ref 30.0–36.0)
MCV: 90 fL (ref 80.0–100.0)
Monocytes Absolute: 0.8 10*3/uL (ref 0.1–1.0)
Monocytes Relative: 12 %
Neutro Abs: 4.5 10*3/uL (ref 1.7–7.7)
Neutrophils Relative %: 73 %
Platelet Count: 311 10*3/uL (ref 150–400)
RBC: 4.12 MIL/uL — ABNORMAL LOW (ref 4.22–5.81)
RDW: 15.6 % — ABNORMAL HIGH (ref 11.5–15.5)
WBC Count: 6.3 10*3/uL (ref 4.0–10.5)
nRBC: 0 % (ref 0.0–0.2)

## 2023-01-19 NOTE — Progress Notes (Signed)
[facility]  [address] [place],  [place]  [address] [phone]  Clinic Day:  01/20/2023  Referring physician: Lise Auer, MD  HISTORY OF PRESENT ILLNESS:  The patient is a 72 y.o. male with splenectomy-refractory ITP.  The patient recently had a severe relapse of his ITP.  Over these past weeks, weekly Nplate and Rituxan were effective in returning his platelets to ideal levels.  He is scheduled for his 4th and final week of weekly Rituxan this week.  Overall, the patient is clinically doing much better.  He denies having any subcutaneous bleeding/bruising issues which concern him for a return of his severe thrombocytopenia.  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  The patient completed radiation for this cancer in April 2017.  PHYSICAL EXAM:  Blood pressure 137/89, pulse 70, temperature 98.4 F (36.9 C), resp. rate 16, height 5\' 8"  (1.727 m), weight 187 lb 12.8 oz (85.2 kg), SpO2 95%. Body mass index is 28.55 kg/m. Performance status (ECOG): 1 - Symptomatic but completely ambulatory Physical Exam Constitutional:      General: He is not in acute distress.    Appearance: Normal appearance. He is normal weight.  HENT:     Head: Normocephalic and atraumatic.     Nose:     Right Nostril: No epistaxis.     Left Nostril: No epistaxis.  Eyes:     General: No scleral icterus.    Extraocular Movements: Extraocular movements intact.     Conjunctiva/sclera: Conjunctivae normal.     Pupils: Pupils are equal, round, and reactive to light.  Cardiovascular:     Rate and Rhythm: Normal rate and regular rhythm.     Pulses: Normal pulses.     Heart sounds: Normal heart sounds. No murmur heard.    No friction rub. No gallop.  Pulmonary:     Effort: Pulmonary effort is normal. No respiratory distress.     Breath sounds: Normal breath sounds.  Abdominal:     General: Bowel sounds  are normal. There is no distension.     Palpations: Abdomen is soft. There is no hepatomegaly, splenomegaly or mass.     Tenderness: There is no abdominal tenderness.  Musculoskeletal:        General: Normal range of motion.     Cervical back: Normal range of motion and neck supple.     Right lower leg: No edema.     Left lower leg: No edema.  Lymphadenopathy:     Cervical: No cervical adenopathy.  Skin:    General: Skin is warm and dry.     Findings: Bruising (right chin ecchymosis) present.  Neurological:     General: No focal deficit present.     Mental Status: He is alert and oriented to person, place, and time. Mental status is at baseline.  Psychiatric:        Mood and Affect: Mood normal.        Behavior: Behavior normal.        Thought Content: Thought content normal.        Judgment: Judgment normal.    LABS:  ]   ASSESSMENT & PLAN:  Assessment/Plan:  A 72 y.o. male with a recent recurrence of severe, splenectomy-refractory ITP.  I am pleased as his platelet count is fine at 265 today.  Although normal, his platelet count has been trending downwards since finishing  his Rituxan and Nplate in July 2024.  Moving forward, his CBC will be checked once every 3 weeks.  I will see this patient back in 12 weeks for repeat clinical assessment.  The patient understands all the plans discussed today and knows to contact our office before his next visit if he runs into any problems that require immediate clinical attention.    Lyndon Chenoweth Kirby Funk, MD

## 2023-01-20 ENCOUNTER — Other Ambulatory Visit: Payer: Self-pay | Admitting: Oncology

## 2023-01-20 ENCOUNTER — Inpatient Hospital Stay: Payer: Medicare Other | Attending: Oncology | Admitting: Oncology

## 2023-01-20 ENCOUNTER — Inpatient Hospital Stay: Payer: Medicare Other

## 2023-01-20 VITALS — BP 137/89 | HR 70 | Temp 98.4°F | Resp 16 | Ht 68.0 in | Wt 187.8 lb

## 2023-01-20 DIAGNOSIS — D693 Immune thrombocytopenic purpura: Secondary | ICD-10-CM

## 2023-01-20 DIAGNOSIS — C090 Malignant neoplasm of tonsillar fossa: Secondary | ICD-10-CM | POA: Diagnosis not present

## 2023-01-20 DIAGNOSIS — D649 Anemia, unspecified: Secondary | ICD-10-CM | POA: Diagnosis not present

## 2023-01-20 LAB — CBC AND DIFFERENTIAL
HCT: 37 — AB (ref 41–53)
Hemoglobin: 12.3 — AB (ref 13.5–17.5)
Neutrophils Absolute: 5.18
Platelets: 265 10*3/uL (ref 150–400)
WBC: 7.2

## 2023-01-20 LAB — CBC: RBC: 4.18 (ref 3.87–5.11)

## 2023-02-10 ENCOUNTER — Inpatient Hospital Stay: Payer: Medicare Other

## 2023-02-10 DIAGNOSIS — D693 Immune thrombocytopenic purpura: Secondary | ICD-10-CM | POA: Diagnosis not present

## 2023-02-10 LAB — CBC WITH DIFFERENTIAL (CANCER CENTER ONLY)
Abs Immature Granulocytes: 0.01 10*3/uL (ref 0.00–0.07)
Basophils Absolute: 0.1 10*3/uL (ref 0.0–0.1)
Basophils Relative: 1 %
Eosinophils Absolute: 0.1 10*3/uL (ref 0.0–0.5)
Eosinophils Relative: 2 %
HCT: 41.2 % (ref 39.0–52.0)
Hemoglobin: 13 g/dL (ref 13.0–17.0)
Immature Granulocytes: 0 %
Lymphocytes Relative: 9 %
Lymphs Abs: 0.6 10*3/uL — ABNORMAL LOW (ref 0.7–4.0)
MCH: 28.5 pg (ref 26.0–34.0)
MCHC: 31.6 g/dL (ref 30.0–36.0)
MCV: 90.4 fL (ref 80.0–100.0)
Monocytes Absolute: 0.9 10*3/uL (ref 0.1–1.0)
Monocytes Relative: 13 %
Neutro Abs: 5.2 10*3/uL (ref 1.7–7.7)
Neutrophils Relative %: 75 %
Platelet Count: 280 10*3/uL (ref 150–400)
RBC: 4.56 MIL/uL (ref 4.22–5.81)
RDW: 17.2 % — ABNORMAL HIGH (ref 11.5–15.5)
WBC Count: 6.8 10*3/uL (ref 4.0–10.5)
nRBC: 0 % (ref 0.0–0.2)

## 2023-02-18 DIAGNOSIS — Z23 Encounter for immunization: Secondary | ICD-10-CM | POA: Diagnosis not present

## 2023-03-03 ENCOUNTER — Inpatient Hospital Stay: Payer: Medicare Other | Attending: Oncology

## 2023-03-03 DIAGNOSIS — D693 Immune thrombocytopenic purpura: Secondary | ICD-10-CM | POA: Insufficient documentation

## 2023-03-03 LAB — CBC WITH DIFFERENTIAL (CANCER CENTER ONLY)
Abs Immature Granulocytes: 0.01 10*3/uL (ref 0.00–0.07)
Basophils Absolute: 0.1 10*3/uL (ref 0.0–0.1)
Basophils Relative: 1 %
Eosinophils Absolute: 0.2 10*3/uL (ref 0.0–0.5)
Eosinophils Relative: 3 %
HCT: 41.5 % (ref 39.0–52.0)
Hemoglobin: 13.6 g/dL (ref 13.0–17.0)
Immature Granulocytes: 0 %
Lymphocytes Relative: 12 %
Lymphs Abs: 0.8 10*3/uL (ref 0.7–4.0)
MCH: 28.8 pg (ref 26.0–34.0)
MCHC: 32.8 g/dL (ref 30.0–36.0)
MCV: 87.9 fL (ref 80.0–100.0)
Monocytes Absolute: 1.1 10*3/uL — ABNORMAL HIGH (ref 0.1–1.0)
Monocytes Relative: 16 %
Neutro Abs: 4.4 10*3/uL (ref 1.7–7.7)
Neutrophils Relative %: 68 %
Platelet Count: 267 10*3/uL (ref 150–400)
RBC: 4.72 MIL/uL (ref 4.22–5.81)
RDW: 17.4 % — ABNORMAL HIGH (ref 11.5–15.5)
WBC Count: 6.5 10*3/uL (ref 4.0–10.5)
nRBC: 0 % (ref 0.0–0.2)

## 2023-03-24 ENCOUNTER — Inpatient Hospital Stay: Payer: Medicare Other | Attending: Oncology

## 2023-03-24 DIAGNOSIS — D693 Immune thrombocytopenic purpura: Secondary | ICD-10-CM | POA: Diagnosis not present

## 2023-03-24 DIAGNOSIS — Z9081 Acquired absence of spleen: Secondary | ICD-10-CM | POA: Diagnosis not present

## 2023-03-24 LAB — CBC WITH DIFFERENTIAL (CANCER CENTER ONLY)
Abs Immature Granulocytes: 0.02 10*3/uL (ref 0.00–0.07)
Basophils Absolute: 0.1 10*3/uL (ref 0.0–0.1)
Basophils Relative: 1 %
Eosinophils Absolute: 0.3 10*3/uL (ref 0.0–0.5)
Eosinophils Relative: 4 %
HCT: 40.2 % (ref 39.0–52.0)
Hemoglobin: 13.8 g/dL (ref 13.0–17.0)
Immature Granulocytes: 0 %
Lymphocytes Relative: 9 %
Lymphs Abs: 0.6 10*3/uL — ABNORMAL LOW (ref 0.7–4.0)
MCH: 28.7 pg (ref 26.0–34.0)
MCHC: 34.3 g/dL (ref 30.0–36.0)
MCV: 83.6 fL (ref 80.0–100.0)
Monocytes Absolute: 0.7 10*3/uL (ref 0.1–1.0)
Monocytes Relative: 11 %
Neutro Abs: 4.7 10*3/uL (ref 1.7–7.7)
Neutrophils Relative %: 75 %
Platelet Count: 259 10*3/uL (ref 150–400)
RBC: 4.81 MIL/uL (ref 4.22–5.81)
RDW: 18 % — ABNORMAL HIGH (ref 11.5–15.5)
WBC Count: 6.4 10*3/uL (ref 4.0–10.5)
nRBC: 0 % (ref 0.0–0.2)
nRBC: 0 /100{WBCs}

## 2023-04-06 DIAGNOSIS — E039 Hypothyroidism, unspecified: Secondary | ICD-10-CM | POA: Diagnosis not present

## 2023-04-06 DIAGNOSIS — F5102 Adjustment insomnia: Secondary | ICD-10-CM | POA: Diagnosis not present

## 2023-04-06 DIAGNOSIS — E7800 Pure hypercholesterolemia, unspecified: Secondary | ICD-10-CM | POA: Diagnosis not present

## 2023-04-06 DIAGNOSIS — I10 Essential (primary) hypertension: Secondary | ICD-10-CM | POA: Diagnosis not present

## 2023-04-14 ENCOUNTER — Other Ambulatory Visit: Payer: Self-pay | Admitting: Oncology

## 2023-04-14 ENCOUNTER — Inpatient Hospital Stay: Payer: Medicare Other

## 2023-04-14 ENCOUNTER — Inpatient Hospital Stay (HOSPITAL_BASED_OUTPATIENT_CLINIC_OR_DEPARTMENT_OTHER): Payer: Medicare Other | Admitting: Oncology

## 2023-04-14 VITALS — BP 149/97 | HR 78 | Temp 98.0°F | Resp 16 | Ht 68.0 in | Wt 191.0 lb

## 2023-04-14 DIAGNOSIS — D693 Immune thrombocytopenic purpura: Secondary | ICD-10-CM

## 2023-04-14 DIAGNOSIS — Z9081 Acquired absence of spleen: Secondary | ICD-10-CM | POA: Diagnosis not present

## 2023-04-14 LAB — CBC WITH DIFFERENTIAL (CANCER CENTER ONLY)
Abs Immature Granulocytes: 0.02 10*3/uL (ref 0.00–0.07)
Basophils Absolute: 0.1 10*3/uL (ref 0.0–0.1)
Basophils Relative: 1 %
Eosinophils Absolute: 0.3 10*3/uL (ref 0.0–0.5)
Eosinophils Relative: 5 %
HCT: 43.1 % (ref 39.0–52.0)
Hemoglobin: 14.5 g/dL (ref 13.0–17.0)
Immature Granulocytes: 0 %
Lymphocytes Relative: 15 %
Lymphs Abs: 0.8 10*3/uL (ref 0.7–4.0)
MCH: 28.5 pg (ref 26.0–34.0)
MCHC: 33.6 g/dL (ref 30.0–36.0)
MCV: 84.8 fL (ref 80.0–100.0)
Monocytes Absolute: 0.9 10*3/uL (ref 0.1–1.0)
Monocytes Relative: 17 %
Neutro Abs: 3.3 10*3/uL (ref 1.7–7.7)
Neutrophils Relative %: 62 %
Platelet Count: 287 10*3/uL (ref 150–400)
RBC: 5.08 MIL/uL (ref 4.22–5.81)
RDW: 19.2 % — ABNORMAL HIGH (ref 11.5–15.5)
WBC Count: 5.4 10*3/uL (ref 4.0–10.5)
nRBC: 0 % (ref 0.0–0.2)
nRBC: 0 /100{WBCs}

## 2023-04-14 NOTE — Progress Notes (Signed)
[facility]  [address] [place],  [place]  [address] [phone]  Clinic Day:  04/18/2023  Referring physician: Lise Auer, MD   HISTORY OF PRESENT ILLNESS:  The patient is a 72 y.o. male with splenectomy-refractory ITP.  The patient recently had a severe relapse of his ITP, which required weekly Nplate and Rituxan in July 2024.  Since then, his platelets have normalized.  He comes in today for repeat clinical assessment.  Since his last visit, the patient has been doing okay.  He denies having any subcutaneous bleeding/bruising issues which concern him for a return of his severe thrombocytopenia from his ITP.    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  The patient completed radiation for this cancer in April 2017.   PHYSICAL EXAM:  Blood pressure (!) 149/97, pulse 78, temperature 98 F (36.7 C), resp. rate 16, height 5\' 8"  (1.727 m), weight 191 lb (86.6 kg), SpO2 94%. Wt Readings from Last 3 Encounters:  04/14/23 191 lb (86.6 kg)  01/20/23 187 lb 12.8 oz (85.2 kg)  11/27/22 186 lb 0.6 oz (84.4 kg)   Body mass index is 29.04 kg/m. Performance status (ECOG): 2 - Symptomatic, <50% confined to bed Physical Exam Constitutional:      Appearance: Normal appearance. He is not ill-appearing.     Comments: A chronic tracheostomy tube in his neck  HENT:     Mouth/Throat:     Mouth: Mucous membranes are moist.     Pharynx: Oropharynx is clear. No oropharyngeal exudate or posterior oropharyngeal erythema.  Cardiovascular:     Rate and Rhythm: Normal rate and regular rhythm.     Heart sounds: No murmur heard.    No friction rub. No gallop.  Pulmonary:     Effort: Pulmonary effort is normal. No respiratory distress.     Breath sounds: Normal breath sounds. No wheezing, rhonchi or rales.  Abdominal:     General: Bowel sounds are normal. There is no distension.     Palpations:  Abdomen is soft. There is no mass.     Tenderness: There is no abdominal tenderness.  Musculoskeletal:        General: No swelling.     Right lower leg: No edema.     Left lower leg: No edema.  Lymphadenopathy:     Cervical: No cervical adenopathy.     Upper Body:     Right upper body: No supraclavicular or axillary adenopathy.     Left upper body: No supraclavicular or axillary adenopathy.     Lower Body: No right inguinal adenopathy. No left inguinal adenopathy.  Skin:    General: Skin is warm.     Coloration: Skin is not jaundiced.     Findings: No lesion or rash.  Neurological:     General: No focal deficit present.     Mental Status: He is alert and oriented to person, place, and time. Mental status is at baseline.  Psychiatric:        Mood and Affect: Mood normal.        Behavior: Behavior normal.        Thought Content: Thought content normal.   LABS:      Latest Ref Rng & Units 04/14/2023   10:01 AM 03/24/2023   10:08 AM 03/03/2023   10:15 AM  CBC  WBC 4.0 - 10.5 K/uL 5.4  6.4  6.5   Hemoglobin 13.0 - 17.0 g/dL 16.1  09.6  04.5   Hematocrit 39.0 - 52.0 % 43.1  40.2  41.5   Platelets 150 - 400 K/uL 287  259  267       Latest Ref Rng & Units 11/25/2022   12:00 AM 11/07/2022    5:03 PM 11/06/2022    7:00 PM  CMP  Glucose 70 - 99 mg/dL  409  811   BUN 4 - 21 29     57  70   Creatinine 0.6 - 1.3 0.7     0.94  0.96   Sodium 137 - 147 133     133  130   Potassium 3.5 - 5.1 mEq/L 3.9     3.9  3.7   Chloride 99 - 108 100     103  103   CO2 13 - 22 25     21  19    Calcium 8.7 - 10.7 8.7     7.6  7.5   Total Protein 6.5 - 8.1 g/dL   5.3   Total Bilirubin 0.3 - 1.2 mg/dL   0.6   Alkaline Phos 25 - 125 60      45   AST 14 - 40 61      27   ALT 10 - 40 U/L 17      16      This result is from an external source.    ASSESSMENT & PLAN:  Assessment/Plan:  A 72 y.o. male with  a recent recurrence of severe, splenectomy-refractory ITP.  I am pleased as his platelet count  is fine at 287 today.  His platelet count has held stable over these past months without any particular intervention.  From a hematologic standpoint, the patient is doing well.  Moving forward, his CBC will be checked monthly.  I will see this patient back in 4 months for repeat clinical assessment. The patient understands all the plans discussed today and is in agreement with them.      Rhegan Trunnell Kirby Funk, MD

## 2023-04-18 ENCOUNTER — Encounter: Payer: Self-pay | Admitting: Oncology

## 2023-04-29 DIAGNOSIS — C099 Malignant neoplasm of tonsil, unspecified: Secondary | ICD-10-CM | POA: Diagnosis not present

## 2023-05-14 ENCOUNTER — Inpatient Hospital Stay: Payer: Medicare Other | Attending: Oncology

## 2023-05-14 DIAGNOSIS — D693 Immune thrombocytopenic purpura: Secondary | ICD-10-CM | POA: Diagnosis not present

## 2023-05-14 LAB — CBC WITH DIFFERENTIAL (CANCER CENTER ONLY)
Abs Immature Granulocytes: 0.02 10*3/uL (ref 0.00–0.07)
Basophils Absolute: 0.1 10*3/uL (ref 0.0–0.1)
Basophils Relative: 1 %
Eosinophils Absolute: 0.3 10*3/uL (ref 0.0–0.5)
Eosinophils Relative: 4 %
HCT: 38.9 % — ABNORMAL LOW (ref 39.0–52.0)
Hemoglobin: 13.3 g/dL (ref 13.0–17.0)
Immature Granulocytes: 0 %
Lymphocytes Relative: 7 %
Lymphs Abs: 0.6 10*3/uL — ABNORMAL LOW (ref 0.7–4.0)
MCH: 29.6 pg (ref 26.0–34.0)
MCHC: 34.2 g/dL (ref 30.0–36.0)
MCV: 86.6 fL (ref 80.0–100.0)
Monocytes Absolute: 1 10*3/uL (ref 0.1–1.0)
Monocytes Relative: 11 %
Neutro Abs: 6.9 10*3/uL (ref 1.7–7.7)
Neutrophils Relative %: 77 %
Platelet Count: 251 10*3/uL (ref 150–400)
RBC: 4.49 MIL/uL (ref 4.22–5.81)
RDW: 18.7 % — ABNORMAL HIGH (ref 11.5–15.5)
WBC Count: 8.8 10*3/uL (ref 4.0–10.5)
nRBC: 0 % (ref 0.0–0.2)
nRBC: 0 /100{WBCs}

## 2023-06-14 ENCOUNTER — Inpatient Hospital Stay: Payer: Medicare Other | Attending: Oncology

## 2023-06-14 DIAGNOSIS — D693 Immune thrombocytopenic purpura: Secondary | ICD-10-CM | POA: Insufficient documentation

## 2023-06-14 LAB — CBC WITH DIFFERENTIAL (CANCER CENTER ONLY)
Abs Immature Granulocytes: 0.01 10*3/uL (ref 0.00–0.07)
Basophils Absolute: 0.1 10*3/uL (ref 0.0–0.1)
Basophils Relative: 1 %
Eosinophils Absolute: 0.2 10*3/uL (ref 0.0–0.5)
Eosinophils Relative: 4 %
HCT: 40.2 % (ref 39.0–52.0)
Hemoglobin: 13.8 g/dL (ref 13.0–17.0)
Immature Granulocytes: 0 %
Lymphocytes Relative: 13 %
Lymphs Abs: 0.7 10*3/uL (ref 0.7–4.0)
MCH: 30.6 pg (ref 26.0–34.0)
MCHC: 34.3 g/dL (ref 30.0–36.0)
MCV: 89.1 fL (ref 80.0–100.0)
Monocytes Absolute: 1.1 10*3/uL — ABNORMAL HIGH (ref 0.1–1.0)
Monocytes Relative: 19 %
Neutro Abs: 3.7 10*3/uL (ref 1.7–7.7)
Neutrophils Relative %: 63 %
Platelet Count: 262 10*3/uL (ref 150–400)
RBC: 4.51 MIL/uL (ref 4.22–5.81)
RDW: 17.5 % — ABNORMAL HIGH (ref 11.5–15.5)
WBC Count: 5.8 10*3/uL (ref 4.0–10.5)
nRBC: 0 % (ref 0.0–0.2)
nRBC: 0 /100{WBCs}

## 2023-07-02 DIAGNOSIS — R072 Precordial pain: Secondary | ICD-10-CM

## 2023-07-02 DIAGNOSIS — I420 Dilated cardiomyopathy: Secondary | ICD-10-CM | POA: Diagnosis not present

## 2023-07-02 DIAGNOSIS — I10 Essential (primary) hypertension: Secondary | ICD-10-CM | POA: Diagnosis not present

## 2023-07-02 HISTORY — DX: Precordial pain: R07.2

## 2023-07-15 ENCOUNTER — Inpatient Hospital Stay: Payer: Medicare Other | Attending: Oncology

## 2023-07-15 DIAGNOSIS — D693 Immune thrombocytopenic purpura: Secondary | ICD-10-CM | POA: Insufficient documentation

## 2023-07-15 LAB — CBC WITH DIFFERENTIAL (CANCER CENTER ONLY)
Abs Immature Granulocytes: 0.03 10*3/uL (ref 0.00–0.07)
Basophils Absolute: 0.1 10*3/uL (ref 0.0–0.1)
Basophils Relative: 1 %
Eosinophils Absolute: 0.3 10*3/uL (ref 0.0–0.5)
Eosinophils Relative: 4 %
HCT: 42 % (ref 39.0–52.0)
Hemoglobin: 14.4 g/dL (ref 13.0–17.0)
Immature Granulocytes: 0 %
Lymphocytes Relative: 9 %
Lymphs Abs: 0.8 10*3/uL (ref 0.7–4.0)
MCH: 31.3 pg (ref 26.0–34.0)
MCHC: 34.3 g/dL (ref 30.0–36.0)
MCV: 91.3 fL (ref 80.0–100.0)
Monocytes Absolute: 0.8 10*3/uL (ref 0.1–1.0)
Monocytes Relative: 9 %
Neutro Abs: 6.7 10*3/uL (ref 1.7–7.7)
Neutrophils Relative %: 77 %
Platelet Count: 258 10*3/uL (ref 150–400)
RBC: 4.6 MIL/uL (ref 4.22–5.81)
RDW: 15.5 % (ref 11.5–15.5)
WBC Count: 8.6 10*3/uL (ref 4.0–10.5)
nRBC: 0 % (ref 0.0–0.2)
nRBC: 0 /100{WBCs}

## 2023-08-04 DIAGNOSIS — T85528A Displacement of other gastrointestinal prosthetic devices, implants and grafts, initial encounter: Secondary | ICD-10-CM | POA: Diagnosis not present

## 2023-08-04 DIAGNOSIS — Z931 Gastrostomy status: Secondary | ICD-10-CM | POA: Diagnosis not present

## 2023-08-04 DIAGNOSIS — X58XXXA Exposure to other specified factors, initial encounter: Secondary | ICD-10-CM | POA: Diagnosis not present

## 2023-08-04 DIAGNOSIS — Z8589 Personal history of malignant neoplasm of other organs and systems: Secondary | ICD-10-CM | POA: Diagnosis not present

## 2023-08-04 DIAGNOSIS — Z930 Tracheostomy status: Secondary | ICD-10-CM | POA: Diagnosis not present

## 2023-08-04 DIAGNOSIS — K9423 Gastrostomy malfunction: Secondary | ICD-10-CM | POA: Diagnosis not present

## 2023-08-11 NOTE — Progress Notes (Signed)
 [facility]  [address] [place],  [place]  [address] [phone]  Clinic Day: 08/12/2023  Referring physician: Lise Auer, MD   HISTORY OF PRESENT ILLNESS:  The patient is a 73 y.o. male with splenectomy-refractory ITP.  The patient recently had a severe relapse of his ITP, which required weekly Nplate and Rituxan in July 2024.  Since then, his platelets have normalized.  He comes in today for repeat clinical assessment.  Since his last visit, the patient has been doing okay.  He denies having any subcutaneous bleeding/bruising issues which concern him for a return of his severe thrombocytopenia from his ITP.    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  The patient completed radiation for this cancer in April 2017.   PHYSICAL EXAM:  Blood pressure 117/79, pulse 62, temperature 98 F (36.7 C), temperature source Oral, resp. rate 18, height 5\' 8"  (1.727 m), weight 199 lb 4.8 oz (90.4 kg), SpO2 96%. Wt Readings from Last 3 Encounters:  08/12/23 199 lb 4.8 oz (90.4 kg)  04/14/23 191 lb (86.6 kg)  01/20/23 187 lb 12.8 oz (85.2 kg)   Body mass index is 30.3 kg/m. Performance status (ECOG): 2 - Symptomatic, <50% confined to bed Physical Exam Constitutional:      Appearance: Normal appearance. He is not ill-appearing.     Comments: A chronic tracheostomy tube in his neck  HENT:     Mouth/Throat:     Mouth: Mucous membranes are moist.     Pharynx: Oropharynx is clear. No oropharyngeal exudate or posterior oropharyngeal erythema.  Cardiovascular:     Rate and Rhythm: Normal rate and regular rhythm.     Heart sounds: No murmur heard.    No friction rub. No gallop.  Pulmonary:     Effort: Pulmonary effort is normal. No respiratory distress.     Breath sounds: Normal breath sounds. No wheezing, rhonchi or rales.  Abdominal:     General: Bowel sounds are normal. There is no  distension.     Palpations: Abdomen is soft. There is no mass.     Tenderness: There is no abdominal tenderness.  Musculoskeletal:        General: No swelling.     Right lower leg: No edema.     Left lower leg: No edema.  Lymphadenopathy:     Cervical: No cervical adenopathy.     Upper Body:     Right upper body: No supraclavicular or axillary adenopathy.     Left upper body: No supraclavicular or axillary adenopathy.     Lower Body: No right inguinal adenopathy. No left inguinal adenopathy.  Skin:    General: Skin is warm.     Coloration: Skin is not jaundiced.     Findings: No lesion or rash.  Neurological:     General: No focal deficit present.     Mental Status: He is alert and oriented to person, place, and time. Mental status is at baseline.  Psychiatric:        Mood and Affect: Mood normal.        Behavior: Behavior normal.        Thought Content: Thought content normal.    LABS:      Latest Ref Rng & Units 08/12/2023   10:05 AM 07/15/2023    9:59 AM 06/14/2023   10:09 AM  CBC  WBC 4.0 -  10.5 K/uL 15.0  8.6  5.8   Hemoglobin 13.0 - 17.0 g/dL 86.5  78.4  69.6   Hematocrit 39.0 - 52.0 % 42.2  42.0  40.2   Platelets 150 - 400 K/uL 261  258  262       Latest Ref Rng & Units 11/25/2022   12:00 AM 11/07/2022    5:03 PM 11/06/2022    7:00 PM  CMP  Glucose 70 - 99 mg/dL  295  284   BUN 4 - 21 29     57  70   Creatinine 0.6 - 1.3 0.7     0.94  0.96   Sodium 137 - 147 133     133  130   Potassium 3.5 - 5.1 mEq/L 3.9     3.9  3.7   Chloride 99 - 108 100     103  103   CO2 13 - 22 25     21  19    Calcium 8.7 - 10.7 8.7     7.6  7.5   Total Protein 6.5 - 8.1 g/dL   5.3   Total Bilirubin 0.3 - 1.2 mg/dL   0.6   Alkaline Phos 25 - 125 60      45   AST 14 - 40 61      27   ALT 10 - 40 U/L 17      16      This result is from an external source.    ASSESSMENT & PLAN:  Assessment/Plan:  A 73 y.o. male with a history of severe, splenectomy-refractory ITP.  I am pleased as  his platelet count remains  fine at 261 today.  His platelet count has held stable over these past months without any particular intervention.  From a hematologic standpoint, the patient is doing well.  Moving forward, his CBC will be checked every 2 months.  I will see this patient back in 6 months for repeat clinical assessment. The patient understands all the plans discussed today and is in agreement with them.      Danayah Smyre Kirby Funk, MD

## 2023-08-12 ENCOUNTER — Telehealth: Payer: Self-pay | Admitting: Oncology

## 2023-08-12 ENCOUNTER — Other Ambulatory Visit: Payer: Self-pay | Admitting: Oncology

## 2023-08-12 ENCOUNTER — Inpatient Hospital Stay: Payer: Medicare Other

## 2023-08-12 ENCOUNTER — Inpatient Hospital Stay: Payer: Medicare Other | Attending: Oncology | Admitting: Oncology

## 2023-08-12 VITALS — BP 117/79 | HR 62 | Temp 98.0°F | Resp 18 | Ht 68.0 in | Wt 199.3 lb

## 2023-08-12 DIAGNOSIS — D693 Immune thrombocytopenic purpura: Secondary | ICD-10-CM | POA: Diagnosis not present

## 2023-08-12 LAB — CBC WITH DIFFERENTIAL (CANCER CENTER ONLY)
Abs Immature Granulocytes: 0.06 10*3/uL (ref 0.00–0.07)
Basophils Absolute: 0 10*3/uL (ref 0.0–0.1)
Basophils Relative: 0 %
Eosinophils Absolute: 0 10*3/uL (ref 0.0–0.5)
Eosinophils Relative: 0 %
HCT: 42.2 % (ref 39.0–52.0)
Hemoglobin: 14.7 g/dL (ref 13.0–17.0)
Immature Granulocytes: 0 %
Lymphocytes Relative: 3 %
Lymphs Abs: 0.5 10*3/uL — ABNORMAL LOW (ref 0.7–4.0)
MCH: 31.8 pg (ref 26.0–34.0)
MCHC: 34.8 g/dL (ref 30.0–36.0)
MCV: 91.3 fL (ref 80.0–100.0)
Monocytes Absolute: 0.8 10*3/uL (ref 0.1–1.0)
Monocytes Relative: 5 %
Neutro Abs: 13.6 10*3/uL — ABNORMAL HIGH (ref 1.7–7.7)
Neutrophils Relative %: 92 %
Platelet Count: 261 10*3/uL (ref 150–400)
RBC: 4.62 MIL/uL (ref 4.22–5.81)
RDW: 15.1 % (ref 11.5–15.5)
WBC Count: 15 10*3/uL — ABNORMAL HIGH (ref 4.0–10.5)
nRBC: 0 % (ref 0.0–0.2)
nRBC: 0 /100{WBCs}

## 2023-08-12 NOTE — Telephone Encounter (Signed)
 08/12/23 Spoke with patient and confirmed next appt.

## 2023-09-30 ENCOUNTER — Encounter (HOSPITAL_BASED_OUTPATIENT_CLINIC_OR_DEPARTMENT_OTHER): Payer: Self-pay

## 2023-09-30 ENCOUNTER — Ambulatory Visit (INDEPENDENT_AMBULATORY_CARE_PROVIDER_SITE_OTHER): Admit: 2023-09-30 | Discharge: 2023-09-30 | Disposition: A | Discharge status: Home / Self Care

## 2023-09-30 ENCOUNTER — Ambulatory Visit (HOSPITAL_BASED_OUTPATIENT_CLINIC_OR_DEPARTMENT_OTHER)
Admission: EM | Admit: 2023-09-30 | Discharge: 2023-09-30 | Disposition: A | Discharge status: Other Acute Inpatient Hospital | Attending: Family Medicine | Admitting: Family Medicine

## 2023-09-30 DIAGNOSIS — R059 Cough, unspecified: Secondary | ICD-10-CM

## 2023-09-30 DIAGNOSIS — R0689 Other abnormalities of breathing: Secondary | ICD-10-CM | POA: Diagnosis not present

## 2023-09-30 DIAGNOSIS — J810 Acute pulmonary edema: Secondary | ICD-10-CM | POA: Diagnosis not present

## 2023-09-30 DIAGNOSIS — J9601 Acute respiratory failure with hypoxia: Secondary | ICD-10-CM

## 2023-09-30 DIAGNOSIS — R0602 Shortness of breath: Secondary | ICD-10-CM

## 2023-09-30 DIAGNOSIS — R918 Other nonspecific abnormal finding of lung field: Secondary | ICD-10-CM | POA: Diagnosis not present

## 2023-09-30 DIAGNOSIS — R9431 Abnormal electrocardiogram [ECG] [EKG]: Secondary | ICD-10-CM | POA: Diagnosis not present

## 2023-09-30 DIAGNOSIS — I4891 Unspecified atrial fibrillation: Secondary | ICD-10-CM | POA: Diagnosis not present

## 2023-09-30 DIAGNOSIS — I10 Essential (primary) hypertension: Secondary | ICD-10-CM | POA: Diagnosis not present

## 2023-09-30 DIAGNOSIS — I161 Hypertensive emergency: Secondary | ICD-10-CM | POA: Diagnosis not present

## 2023-09-30 DIAGNOSIS — I5021 Acute systolic (congestive) heart failure: Secondary | ICD-10-CM | POA: Diagnosis not present

## 2023-09-30 DIAGNOSIS — I110 Hypertensive heart disease with heart failure: Secondary | ICD-10-CM | POA: Diagnosis not present

## 2023-09-30 DIAGNOSIS — R069 Unspecified abnormalities of breathing: Secondary | ICD-10-CM | POA: Diagnosis not present

## 2023-09-30 DIAGNOSIS — E871 Hypo-osmolality and hyponatremia: Secondary | ICD-10-CM | POA: Diagnosis not present

## 2023-09-30 DIAGNOSIS — I444 Left anterior fascicular block: Secondary | ICD-10-CM | POA: Diagnosis not present

## 2023-09-30 DIAGNOSIS — R0989 Other specified symptoms and signs involving the circulatory and respiratory systems: Secondary | ICD-10-CM | POA: Diagnosis not present

## 2023-09-30 DIAGNOSIS — I480 Paroxysmal atrial fibrillation: Secondary | ICD-10-CM | POA: Diagnosis not present

## 2023-09-30 NOTE — ED Provider Notes (Addendum)
 [facility]    CSN: [id number] Arrival date & time: 09/30/23  1309      History   Chief Complaint Chief Complaint  Patient presents with   Shortness of Breath   chest congestion    HPI William Bell is a 73 y.o. male.    Patient is a 73 year old male with a past medical history of head and neck cancer, ITP, tracheostomy dependence.  He presents today with shortness of breath and congestion.  Cough and congestion onset last week.  More short of breath with exertion.  No fevers or chills.  Upon arrival oxygen  saturations in the mid to high 80s on room air.  He does not use oxygen  at home. Visibly dusky in color. Oxygen  applied on arrival.    Shortness of Breath   Past Medical History:  Diagnosis Date   Chronic ITP (idiopathic thrombocytopenia) (HCC)    Refractory to splenectomy   Head and neck cancer [facility])     Patient Active Problem List   Diagnosis Date Noted   Tracheostomy hemorrhage (HCC) 11/07/2022   ABLA (acute blood loss anemia) 11/07/2022   Acute ITP (HCC) 11/06/2022   Malignant neoplasm of tonsillar fossa (HCC) 06/19/2020   Idiopathic thrombocytopenic purpura (ITP) (HCC) 06/14/2020   Tracheostomy dependence (HCC) 05/06/2019    Past Surgical History:  Procedure Laterality Date   TRACHEOSTOMY         Home Medications    Prior to Admission medications   Medication Sig Start Date End Date Taking? Authorizing Provider  acetaminophen  (TYLENOL ) 500 MG tablet Take 1,000 mg by mouth every 6 (six) hours as needed for mild pain or headache.    [provider]  atorvastatin  (LIPITOR) 40 MG tablet Take 40 mg by mouth daily. 09/28/22   [provider]  citalopram  (CELEXA ) 20 MG tablet Take 20 mg by mouth daily.    [provider]  co-enzyme Q-10 30 MG capsule Take 30 mg by mouth daily.    [provider]  levothyroxine  (SYNTHROID ) 75 MCG tablet 75 mcg. 12/31/20   [provider]  LORazepam (ATIVAN) 1 MG  tablet Take 1 mg by mouth 2 (two) times daily. 07/06/23   [provider]  Multiple Vitamin (MULTIVITAMIN+) LIQD 5 mL by G-tube route daily.    [provider]  polyethylene glycol powder (GLYCOLAX/MIRALAX) 17 GM/SCOOP powder 17 g by G-tube route daily as needed for constipation.    [provider]  Vitamin E 670 MG (1000 UT) CAPS 1,000 Units by G-tube route daily.    [provider]    Family History History reviewed. No pertinent family history.  Social History Social History   Tobacco Use   Smoking status: Former   Smokeless tobacco: Never     Allergies   Patient has no known allergies.   Review of Systems Review of Systems  Respiratory:  Positive for shortness of breath.      Physical Exam Triage Vital Signs ED Triage Vitals  Encounter Vitals Group     BP 09/30/23 1326 (!) 129/97     Systolic BP Percentile --      Diastolic BP Percentile --      Pulse Rate 09/30/23 1326 84     Resp 09/30/23 1326 (!) 22     Temp 09/30/23 1326 98.3 F (36.8 C)     Temp Source 09/30/23 1326 Oral     SpO2 09/30/23 1326 (!) 88 %     Weight --  Height --      Head Circumference --      Peak Flow --      Pain Score 09/30/23 1330 0     Pain Loc --      Pain Education --      Exclude from Growth Chart --    No data found.  Updated Vital Signs BP (!) 129/97 (BP Location: Right Arm)   Pulse 84   Temp 98.3 F (36.8 C) (Oral)   Resp (!) 22   SpO2 (!) 85%   Visual Acuity Right Eye Distance:   Left Eye Distance:   Bilateral Distance:    Right Eye Near:   Left Eye Near:    Bilateral Near:     Physical Exam Vitals and nursing note reviewed.  Constitutional:      General: He is in acute distress.     Appearance: He is ill-appearing. He is not toxic-appearing.  Cardiovascular:     Rate and Rhythm: Normal rate. Rhythm irregular.  Pulmonary:     Effort: Tachypnea present.     Breath sounds: Examination of the right-middle field  reveals decreased breath sounds. Examination of the left-middle field reveals decreased breath sounds. Examination of the right-lower field reveals decreased breath sounds. Examination of the left-lower field reveals decreased breath sounds. Decreased breath sounds present.  Musculoskeletal:     Cervical back: Normal range of motion.  Skin:    General: Skin is warm and dry.  Neurological:     Mental Status: He is alert.      UC Treatments / Results  Labs (all labs ordered are listed, but only abnormal results are displayed) Labs Reviewed - No data to display  EKG   Radiology DG Chest 2 View Result Date: 09/30/2023 CLINICAL DATA:  Shortness of breath.  Cough. EXAM: CHEST - 2 VIEW COMPARISON:  February 04, 2021. FINDINGS: Tracheostomy tube is again noted. Mild cardiomegaly is noted with mild central pulmonary vascular congestion. Probable bilateral pulmonary edema is noted. Small pleural effusions are noted. Bony thorax is unremarkable. IMPRESSION: Mild cardiomegaly with central pulmonary vascular congestion and probable bilateral pulmonary edema and small pleural effusions. Electronically Signed   By: Rosalene Colon M.D.   On: 09/30/2023 14:58    Procedures Procedures (including critical care time)  Medications Ordered in UC Medications - No data to display  Initial Impression / Assessment and Plan / UC Course  I have reviewed the triage vital signs and the nursing notes.  Pertinent labs & imaging results that were available during my care of the patient were reviewed by me and considered in my medical decision making (see chart for details).     73 year old male presents today with increased congestion and shortness of breath mostly on exertion.  Upon arrival was found to be hypoxic with sats in the 80s and slightly dusky.  Applied 3 L of oxygen  and patient saturations up into the 90s. We did get an x-ray here which showed mild cardiomegaly with central pulmonary vascular  congestion and probable bilateral pulmonary edema and small pleural effusions. Based on presentation and x-ray results will send to the ER  Sending via EMS due to hypoxia   Stable upon EMS arrival and departure.  Final Clinical Impressions(s) / UC Diagnoses   Final diagnoses:  SOB (shortness of breath)  Acute pulmonary edema (HCC)  Acute respiratory failure with hypoxia [facility])     Discharge Instructions      Sending to the ER for Pulmonary  Edema and Hypoxia   ED Prescriptions   None    PDMP not reviewed this encounter.   Landa Pine, FNP 09/30/23 1530    Landa Pine, FNP 09/30/23 1530

## 2023-09-30 NOTE — ED Notes (Signed)
 Patient is being discharged from the Urgent Care and sent to the Emergency Department via EMS . Per T. Bast,FNP, patient is in need of higher level of care due to hypoxia. Patient is aware and verbalizes understanding of plan of care.  Vitals:   09/30/23 1326 09/30/23 1508  BP: (!) 129/97 (!) 167/111  Pulse: 84 84  Resp: (!) 22   Temp: 98.3 F (36.8 C)   SpO2: (!) 88% (!) 85%

## 2023-09-30 NOTE — ED Triage Notes (Signed)
 Patient has trach. Onset of cough and chest congestion last week. No fevers. Room air O2 sat 88%. Patient states very short of breath with exertion of any kind. Fingers dusky in color.

## 2023-09-30 NOTE — ED Notes (Signed)
 EMS arrival. Report given. Radiology report, face sheet, and patient timeline provided to EMS Crew.

## 2023-09-30 NOTE — ED Notes (Signed)
 Radiology results available. Provider in to speak to patient regarding need for higher level of care. Patient remains on oxygen  with O2 sat 95%. Oxygen  removed to assess patient's tolerance to room air.

## 2023-09-30 NOTE — Discharge Instructions (Addendum)
 Sending to the ER for Pulmonary Edema and Hypoxia

## 2023-09-30 NOTE — ED Notes (Addendum)
 EMS called to transport patient to ER.  Oxygen  3L via nasal canula applied.

## 2023-09-30 NOTE — ED Notes (Signed)
 Applied Oxygen  via 3L N/C to assess if patient's O2 sat and shortness of breath improves. Provider at bedside.

## 2023-10-01 DIAGNOSIS — I110 Hypertensive heart disease with heart failure: Secondary | ICD-10-CM | POA: Diagnosis not present

## 2023-10-01 DIAGNOSIS — I340 Nonrheumatic mitral (valve) insufficiency: Secondary | ICD-10-CM | POA: Diagnosis not present

## 2023-10-01 DIAGNOSIS — J811 Chronic pulmonary edema: Secondary | ICD-10-CM | POA: Diagnosis not present

## 2023-10-01 DIAGNOSIS — J9601 Acute respiratory failure with hypoxia: Secondary | ICD-10-CM | POA: Diagnosis not present

## 2023-10-01 DIAGNOSIS — I480 Paroxysmal atrial fibrillation: Secondary | ICD-10-CM | POA: Diagnosis not present

## 2023-10-01 DIAGNOSIS — I160 Hypertensive urgency: Secondary | ICD-10-CM | POA: Diagnosis not present

## 2023-10-01 DIAGNOSIS — E785 Hyperlipidemia, unspecified: Secondary | ICD-10-CM | POA: Diagnosis not present

## 2023-10-01 DIAGNOSIS — I371 Nonrheumatic pulmonary valve insufficiency: Secondary | ICD-10-CM | POA: Diagnosis not present

## 2023-10-01 DIAGNOSIS — I361 Nonrheumatic tricuspid (valve) insufficiency: Secondary | ICD-10-CM | POA: Diagnosis not present

## 2023-10-01 DIAGNOSIS — I5021 Acute systolic (congestive) heart failure: Secondary | ICD-10-CM | POA: Diagnosis not present

## 2023-10-02 DIAGNOSIS — J811 Chronic pulmonary edema: Secondary | ICD-10-CM | POA: Diagnosis not present

## 2023-10-02 DIAGNOSIS — J9601 Acute respiratory failure with hypoxia: Secondary | ICD-10-CM | POA: Diagnosis not present

## 2023-10-02 DIAGNOSIS — I429 Cardiomyopathy, unspecified: Secondary | ICD-10-CM | POA: Diagnosis not present

## 2023-10-02 DIAGNOSIS — I4891 Unspecified atrial fibrillation: Secondary | ICD-10-CM | POA: Diagnosis not present

## 2023-10-02 DIAGNOSIS — I110 Hypertensive heart disease with heart failure: Secondary | ICD-10-CM | POA: Diagnosis not present

## 2023-10-02 DIAGNOSIS — I5021 Acute systolic (congestive) heart failure: Secondary | ICD-10-CM | POA: Diagnosis not present

## 2023-10-02 DIAGNOSIS — I160 Hypertensive urgency: Secondary | ICD-10-CM | POA: Diagnosis not present

## 2023-10-03 DIAGNOSIS — I5021 Acute systolic (congestive) heart failure: Secondary | ICD-10-CM | POA: Diagnosis not present

## 2023-10-03 DIAGNOSIS — I429 Cardiomyopathy, unspecified: Secondary | ICD-10-CM | POA: Diagnosis not present

## 2023-10-03 DIAGNOSIS — J9601 Acute respiratory failure with hypoxia: Secondary | ICD-10-CM | POA: Diagnosis not present

## 2023-10-03 DIAGNOSIS — I160 Hypertensive urgency: Secondary | ICD-10-CM | POA: Diagnosis not present

## 2023-10-03 DIAGNOSIS — I4891 Unspecified atrial fibrillation: Secondary | ICD-10-CM | POA: Diagnosis not present

## 2023-10-03 DIAGNOSIS — I110 Hypertensive heart disease with heart failure: Secondary | ICD-10-CM | POA: Diagnosis not present

## 2023-10-04 DIAGNOSIS — I10 Essential (primary) hypertension: Secondary | ICD-10-CM | POA: Diagnosis not present

## 2023-10-04 DIAGNOSIS — Z6830 Body mass index (BMI) 30.0-30.9, adult: Secondary | ICD-10-CM | POA: Diagnosis not present

## 2023-10-06 DIAGNOSIS — I480 Paroxysmal atrial fibrillation: Secondary | ICD-10-CM | POA: Diagnosis not present

## 2023-10-06 DIAGNOSIS — E039 Hypothyroidism, unspecified: Secondary | ICD-10-CM | POA: Diagnosis not present

## 2023-10-06 DIAGNOSIS — E871 Hypo-osmolality and hyponatremia: Secondary | ICD-10-CM | POA: Diagnosis not present

## 2023-10-06 DIAGNOSIS — Z0000 Encounter for general adult medical examination without abnormal findings: Secondary | ICD-10-CM | POA: Diagnosis not present

## 2023-10-06 DIAGNOSIS — E876 Hypokalemia: Secondary | ICD-10-CM | POA: Diagnosis not present

## 2023-10-07 ENCOUNTER — Ambulatory Visit: Attending: Cardiology | Admitting: Cardiology

## 2023-10-07 ENCOUNTER — Encounter: Payer: Self-pay | Admitting: Cardiology

## 2023-10-07 VITALS — BP 94/52 | HR 59 | Ht 68.5 in | Wt 199.2 lb

## 2023-10-07 DIAGNOSIS — I4819 Other persistent atrial fibrillation: Secondary | ICD-10-CM | POA: Diagnosis not present

## 2023-10-07 DIAGNOSIS — I10 Essential (primary) hypertension: Secondary | ICD-10-CM | POA: Insufficient documentation

## 2023-10-07 DIAGNOSIS — D693 Immune thrombocytopenic purpura: Secondary | ICD-10-CM | POA: Diagnosis not present

## 2023-10-07 DIAGNOSIS — I4891 Unspecified atrial fibrillation: Secondary | ICD-10-CM

## 2023-10-07 DIAGNOSIS — I420 Dilated cardiomyopathy: Secondary | ICD-10-CM | POA: Insufficient documentation

## 2023-10-07 DIAGNOSIS — I272 Pulmonary hypertension, unspecified: Secondary | ICD-10-CM | POA: Insufficient documentation

## 2023-10-07 HISTORY — DX: Unspecified atrial fibrillation: I48.91

## 2023-10-07 NOTE — Patient Instructions (Signed)
 Medication Instructions:  Your physician recommends that you continue on your current medications as directed. Please refer to the Current Medication list given to you today.  *If you need a refill on your cardiac medications before your next appointment, please call your pharmacy*   Lab Work: BMP, CBC- today If you have labs (blood work) drawn today and your tests are completely normal, you will receive your results only by: MyChart Message (if you have MyChart) OR A paper copy in the mail If you have any lab test that is abnormal or we need to change your treatment, we will call you to review the results.   Testing/Procedures: None Ordered   Follow-Up: At [facility], you and your health needs are our priority.  As part of our continuing mission to provide you with exceptional heart care, we have created designated Provider Care Teams.  These Care Teams include your primary Cardiologist (physician) and Advanced Practice Providers (APPs -  Physician Assistants and Nurse Practitioners) who all work together to provide you with the care you need, when you need it.  We recommend signing up for the patient portal called "MyChart".  Sign up information is provided on this After Visit Summary.  MyChart is used to connect with patients for Virtual Visits (Telemedicine).  Patients are able to view lab/test results, encounter notes, upcoming appointments, etc.  Non-urgent messages can be sent to your provider as well.   To learn more about what you can do with MyChart, go to [email or web address].    Your next appointment:   3 week(s)  The format for your next appointment:   In Person  Provider:   Ralene Burger, MD    Other Instructions NA

## 2023-10-07 NOTE — Progress Notes (Signed)
 Cardiology Office Note:    Date:  10/07/2023   ID:  William Bell, William Bell [birth year], MRN [id number]  PCP:  Beecher Bower, MD  Cardiologist:  Ralene Burger, MD    Referring MD: Beecher Bower, MD   Chief Complaint  Patient presents with   Atrial Fibrillation    History of Present Illness:    William Bell is a 73 y.o. male past medical history significant for dilated cardiomyopathy (nonischemic, history of ADH abuse, tonsil cancer status post tracheostomy, recently ended up coming to the hospital because of shortness of breath he was found to be in atrial fibrillation fast ventricular rate, also hypertensive urgency, pulmonary edema.  He was treated excellently by hospitalist team.  He also had a chance to see him in the hospital.  He is ventricular rate of atrial fibrillation has been controlled, difficulty we have is anticoagulation he does have history of ITP, that being followed by oncology likely his platelets abnormal recently his CBC was also normal.  Echocardiogram showed diminished ejection fraction 4045%.  We initiated guideline directed medical therapy however difficulty we were facing is high fluctuation of the blood pressure with blood pressure being high and low.  He comes today 2 months for follow-up overall he is doing well.  Denies of any shortness of breath no chest pain tightness squeezing pressure burning chest no palpitations, still in atrial fibrillation with controlled ventricular rate  Past Medical History:  Diagnosis Date   Chronic ITP (idiopathic thrombocytopenia) (HCC)    Refractory to splenectomy   Head and neck cancer (HCC)     Past Surgical History:  Procedure Laterality Date   TRACHEOSTOMY      Current Medications: Current Meds  Medication Sig   acetaminophen  (TYLENOL ) 500 MG tablet Take 1,000 mg by mouth every 6 (six) hours as needed for mild pain or headache.   atorvastatin  (LIPITOR) 40 MG tablet Take 40 mg by mouth daily.   citalopram   (CELEXA ) 20 MG tablet Take 20 mg by mouth daily.   co-enzyme Q-10 30 MG capsule Take 30 mg by mouth daily.   furosemide  (LASIX ) 40 MG tablet Take 40 mg by mouth daily as needed (Weight increase > 3lbs).   levothyroxine  (SYNTHROID ) 100 MCG tablet Take 100 mcg by mouth daily before breakfast.   LORazepam (ATIVAN) 1 MG tablet Take 1 mg by mouth 2 (two) times daily.   losartan (COZAAR) 25 MG tablet Take 25 mg by mouth daily as needed (If systolic > 148).   metoprolol tartrate (LOPRESSOR) 25 MG tablet Take 12.5 mg by mouth 2 (two) times daily.   Multiple Vitamin (MULTIVITAMIN+) LIQD Take 5 mLs by mouth daily.   polyethylene glycol powder (GLYCOLAX/MIRALAX) 17 GM/SCOOP powder Take 119 g by mouth daily.   POTASSIUM CHLORIDE  PO Take 15 mLs by mouth daily.   Vitamin E 670 MG (1000 UT) CAPS Take 1 capsule by mouth daily.     Allergies:   Patient has no known allergies.   Social History   Socioeconomic History   Marital status: Married    Spouse name: Not on file   Number of children: Not on file   Years of education: Not on file   Highest education level: Not on file  Occupational History   Not on file  Tobacco Use   Smoking status: Former   Smokeless tobacco: Never  Substance and Sexual Activity   Alcohol use: Not on file   Drug use: Not on file   Sexual  activity: Not on file  Other Topics Concern   Not on file  Social History Narrative   Not on file   Social Drivers of Health   Financial Resource Strain: Not on file  Food Insecurity: Low Risk  (11/08/2022)   [facility]   Hunger Vital Sign    Worried About Running Out of Food in the Last Year: Never true    Ran Out of Food in the Last Year: Never true  Transportation Needs: Not on file (11/08/2022)  Physical Activity: Not on file  Stress: Not on file  Social Connections: Not on file     Family History: The patient's family history is not on file. ROS:   Please see the history of present  illness.    All 14 point review of systems negative except as described per history of present illness  EKGs/Labs/Other Studies Reviewed:    EKG Interpretation Date/Time:  Thursday Oct 07 2023 10:23:02 EDT Ventricular Rate:  59 PR Interval:    QRS Duration:  118 QT Interval:  462 QTC Calculation: 457 R Axis:   -54  Text Interpretation: Atrial fibrillation with slow ventricular response Left anterior fascicular block Left ventricular hypertrophy with QRS widening Possible Lateral infarct , age undetermined Abnormal ECG When compared with ECG of 22-Jun-2020 12:34, PREVIOUS ECG IS PRESENT Confirmed by Ralene Burger [phone]) on 10/07/2023 10:36:23 AM    Recent Labs: 11/07/2022: Magnesium  1.9 11/25/2022: ALT 17; BUN 29; Creatinine 0.7; Potassium 3.9; Sodium 133 08/12/2023: Hemoglobin 14.7; Platelet Count 261  Recent Lipid Panel No results found for: "CHOL", "TRIG", "HDL", "CHOLHDL", "VLDL", "LDLCALC", "LDLDIRECT"  Physical Exam:    VS:  BP (!) 94/52 (BP Location: Left Arm, Patient Position: Sitting)   Pulse (!) 59   Ht 5' 8.5" (1.74 m)   Wt 199 lb 3.2 oz (90.4 kg)   SpO2 98%   BMI 29.85 kg/m     Wt Readings from Last 3 Encounters:  10/07/23 199 lb 3.2 oz (90.4 kg)  08/12/23 199 lb 4.8 oz (90.4 kg)  04/14/23 191 lb (86.6 kg)     GEN:  Well nourished, well developed in no acute distress HEENT: Normal NECK: No JVD; No carotid bruits LYMPHATICS: No lymphadenopathy CARDIAC: Irregularly no murmurs, no rubs, no gallops RESPIRATORY:  Clear to auscultation without rales, wheezing or rhonchi  ABDOMEN: Soft, non-tender, non-distended MUSCULOSKELETAL:  No edema; No deformity  SKIN: Warm and dry LOWER EXTREMITIES: no swelling NEUROLOGIC:  Alert and oriented x 3 PSYCHIATRIC:  Normal affect   ASSESSMENT:    1. Essential hypertension   2. Dilated cardiomyopathy (HCC)   3. Pulmonary hypertension (HCC)   4. Idiopathic thrombocytopenic purpura (ITP) (HCC)   5. Persistent atrial  fibrillation (HCC)    PLAN:    In order of problems listed above:  Atrial fibrillation rate appears to be controlled.  Still question about anticoagulation as an answer.  I placed a call to his oncologist Dr. Harles Lied so far I have no response.  I also tried to call cancer center no response so far.  Will not actively try to convert him to sinus rhythm unless we have anticoagulation for at least 4 weeks. History of cardiomyopathy.  Tried to put him again as directed medical therapy however obstacle is his blood pressure being low, I will check Chem-7 today to see if he can continue with losartan decision has been made to continue with metoprolol, Lasix  has been on hold right now he is holding  steady weight, his blood pressure is actually low, losartan he should take only if his blood pressure systolics more than 110. History of idiopathic thrombocytopenic purpura, last platelets are excellent.  I will not initiate anticoagulation until I talk to hematology team.   Medication Adjustments/Labs and Tests Ordered: Current medicines are reviewed at length with the patient today.  Concerns regarding medicines are outlined above.  Orders Placed This Encounter  Procedures   CBC   Basic metabolic panel with GFR   EKG 60-AVWU   Medication changes: No orders of the defined types were placed in this encounter.   Signed, Manfred Seed, MD, [facility] 10/07/2023 11:46 AM    [facility]

## 2023-10-08 LAB — CBC
Hematocrit: 43.2 % (ref 37.5–51.0)
Hemoglobin: 13.9 g/dL (ref 13.0–17.7)
MCH: 31.2 pg (ref 26.6–33.0)
MCHC: 32.2 g/dL (ref 31.5–35.7)
MCV: 97 fL (ref 79–97)
Platelets: 274 10*3/uL (ref 150–450)
RBC: 4.46 x10E6/uL (ref 4.14–5.80)
RDW: 14 % (ref 11.6–15.4)
WBC: 6 10*3/uL (ref 3.4–10.8)

## 2023-10-08 LAB — BASIC METABOLIC PANEL WITH GFR
BUN/Creatinine Ratio: 59 — ABNORMAL HIGH (ref 10–24)
BUN: 47 mg/dL — ABNORMAL HIGH (ref 8–27)
CO2: 23 mmol/L (ref 20–29)
Calcium: 9.4 mg/dL (ref 8.6–10.2)
Chloride: 94 mmol/L — ABNORMAL LOW (ref 96–106)
Creatinine, Ser: 0.8 mg/dL (ref 0.76–1.27)
Glucose: 86 mg/dL (ref 70–99)
Potassium: 4.6 mmol/L (ref 3.5–5.2)
Sodium: 134 mmol/L (ref 134–144)
eGFR: 93 mL/min/{1.73_m2} (ref >59)

## 2023-10-12 ENCOUNTER — Other Ambulatory Visit: Payer: Self-pay

## 2023-10-12 ENCOUNTER — Telehealth: Payer: Self-pay | Admitting: Cardiology

## 2023-10-12 ENCOUNTER — Inpatient Hospital Stay: Attending: Oncology

## 2023-10-12 DIAGNOSIS — D693 Immune thrombocytopenic purpura: Secondary | ICD-10-CM | POA: Insufficient documentation

## 2023-10-12 LAB — CBC WITH DIFFERENTIAL (CANCER CENTER ONLY)
Abs Immature Granulocytes: 0.01 10*3/uL (ref 0.00–0.07)
Basophils Absolute: 0.1 10*3/uL (ref 0.0–0.1)
Basophils Relative: 1 %
Eosinophils Absolute: 0.2 10*3/uL (ref 0.0–0.5)
Eosinophils Relative: 4 %
HCT: 44.8 % (ref 39.0–52.0)
Hemoglobin: 14.7 g/dL (ref 13.0–17.0)
Immature Granulocytes: 0 %
Lymphocytes Relative: 11 %
Lymphs Abs: 0.5 10*3/uL — ABNORMAL LOW (ref 0.7–4.0)
MCH: 29.9 pg (ref 26.0–34.0)
MCHC: 32.8 g/dL (ref 30.0–36.0)
MCV: 91.2 fL (ref 80.0–100.0)
Monocytes Absolute: 0.5 10*3/uL (ref 0.1–1.0)
Monocytes Relative: 10 %
Neutro Abs: 3.7 10*3/uL (ref 1.7–7.7)
Neutrophils Relative %: 74 %
Platelet Count: 288 10*3/uL (ref 150–400)
RBC: 4.91 MIL/uL (ref 4.22–5.81)
RDW: 14.6 % (ref 11.5–15.5)
WBC Count: 5.1 10*3/uL (ref 4.0–10.5)
nRBC: 0 % (ref 0.0–0.2)

## 2023-10-12 NOTE — Telephone Encounter (Signed)
 Patients wife came in stating that Davids BP has been really high and she doesn't know what to do to lower it. I made him an appointment for Friday to get checked out, but she's concerned. CB # [phone]

## 2023-10-12 NOTE — Telephone Encounter (Signed)
 Called the patient's wife per DPR and she stated that she was concerned because the patient's blood pressure was very elevated. The patient's blood pressures are as follows:  5/25 - 157/119 5/26 - 161/117 5/27 - 138/97  Patient's wife stated that she was taking his blood pressure in the morning before he had any of his blood pressure medication. She also stated that she was spreading out his blood pressure medication through out the day. Patients wife was advised to not spread out his blood pressure medication throughout the day and for him to take his blood pressure medication in the morning and then to check his blood pressure 2 hours after taking his blood pressure medication. Patients wife verbalized understanding and had no further questions at this time.

## 2023-10-12 NOTE — Telephone Encounter (Signed)
 Patient's wife called with update BP reading's: 10:50am 130/93 1:20pm 151/93 4:28pm 151/110

## 2023-10-13 ENCOUNTER — Telehealth: Payer: Self-pay | Admitting: Cardiology

## 2023-10-13 NOTE — Telephone Encounter (Signed)
 Pt's wife Haskell Linker calling to advise that Dr Harles Lied ok's pt to take Xarelto or Elliquis

## 2023-10-14 DIAGNOSIS — D693 Immune thrombocytopenic purpura: Secondary | ICD-10-CM | POA: Insufficient documentation

## 2023-10-14 DIAGNOSIS — C760 Malignant neoplasm of head, face and neck: Secondary | ICD-10-CM | POA: Insufficient documentation

## 2023-10-15 ENCOUNTER — Ambulatory Visit: Attending: Cardiology | Admitting: Cardiology

## 2023-10-15 ENCOUNTER — Encounter: Payer: Self-pay | Admitting: Cardiology

## 2023-10-15 ENCOUNTER — Telehealth: Payer: Self-pay

## 2023-10-15 VITALS — BP 124/78 | HR 56 | Ht 68.0 in | Wt 194.2 lb

## 2023-10-15 DIAGNOSIS — I420 Dilated cardiomyopathy: Secondary | ICD-10-CM | POA: Diagnosis not present

## 2023-10-15 DIAGNOSIS — D693 Immune thrombocytopenic purpura: Secondary | ICD-10-CM

## 2023-10-15 DIAGNOSIS — I4819 Other persistent atrial fibrillation: Secondary | ICD-10-CM

## 2023-10-15 DIAGNOSIS — I10 Essential (primary) hypertension: Secondary | ICD-10-CM

## 2023-10-15 MED ORDER — APIXABAN 5 MG PO TABS: 5.0000 mg | ORAL_TABLET | Freq: Two times a day (BID) | ORAL | Status: DC

## 2023-10-15 MED ORDER — APIXABAN 5 MG PO TABS: 5.0000 mg | ORAL_TABLET | Freq: Two times a day (BID) | ORAL | 3 refills | Status: DC

## 2023-10-15 NOTE — Progress Notes (Signed)
 Cardiology Office Note:    Date:  10/15/2023   ID:  William Bell, DOB 23-Sep-1950, MRN [id number]  PCP:  Beecher Bower, MD  Cardiologist:  Ralene Burger, MD    Referring MD: Beecher Bower, MD   No chief complaint on file.   History of Present Illness:    William Bell is a 73 y.o. male  past medical history significant for dilated cardiomyopathy (nonischemic, history of ETOH abuse, tonsil cancer status post tracheostomy, recently ended up coming to the hospital because of shortness of breath he was found to be in atrial fibrillation fast ventricular rate, also hypertensive urgency, pulmonary edema. He was treated excellently by hospitalist team. He also had a chance to see him in the hospital. He is ventricular rate of atrial fibrillation has been controlled, difficulty we have is anticoagulation he does have history of ITP, that being followed by oncology likely his platelets abnormal recently his CBC was also normal. Echocardiogram showed diminished ejection fraction 4045%. We initiated guideline directed medical therapy however difficulty we were facing is high fluctuation of the blood pressure with blood pressure being high and low.  Comes today to my office follow-up overall he is doing better.  He denies have any chest pain tightness squeezing pressure burning chest no palpitations no dizziness still some fluctuation of the blood pressure but majority of time is good  Past Medical History:  Diagnosis Date   ABLA (acute blood loss anemia) 11/07/2022   Acute ITP (HCC) 11/06/2022   Airway obstruction 02/08/2019   Added automatically from request for surgery [id number]     Apnea, sleep 03/04/2016   Last Assessment & Plan:    Use home CPAP.   Tracheostomy planned next week.     Atrial fibrillation (HCC) 10/07/2023   Chronic ITP (idiopathic thrombocytopenia) (HCC)    Refractory to splenectomy   Dilated cardiomyopathy (HCC) 07/23/2020   Essential hypertension 03/04/2016    ETOH abuse 06/13/2018   Head and neck cancer (HCC)    Hemoptysis 07/23/2020   History of DVT (deep vein thrombosis) 03/04/2016   History of ITP 03/04/2016   S/P splenectomy     Hypercholesterolemia 03/04/2016   Last Assessment & Plan:    Patient states was previously on statin but discontinued some time ago   Lipitor 80 mg daily based on cardiology recs.     Idiopathic thrombocytopenic purpura (ITP) (HCC) 06/14/2020   Malignant neoplasm of tonsillar fossa (HCC) 06/19/2020   Oropharyngeal dysphagia 10/21/2016   Osteoradionecrosis of jaw 03/04/2016   Added automatically from request for surgery [id number]     Pharyngeal lesion 03/10/2021   Added automatically from request for surgery [id number]     Precordial chest pain 07/02/2023   Pulmonary hypertension (HCC) 07/23/2020   S/P gastrostomy (HCC) 06/16/2018   Last Assessment & Plan:    Resume home feeds as before     Thyroid  disease 06/13/2018   Tracheostomy dependence (HCC) 05/06/2019   Tracheostomy hemorrhage (HCC) 11/07/2022    Past Surgical History:  Procedure Laterality Date   TRACHEOSTOMY      Current Medications: Current Meds  Medication Sig   acetaminophen  (TYLENOL ) 500 MG tablet Take 1,000 mg by mouth every 6 (six) hours as needed for mild pain or headache.   atorvastatin  (LIPITOR) 40 MG tablet Take 40 mg by mouth daily.   citalopram  (CELEXA ) 20 MG tablet Take 20 mg by mouth daily.   co-enzyme Q-10 30 MG capsule Take 30 mg by mouth daily.  furosemide  (LASIX ) 40 MG tablet Take 40 mg by mouth daily as needed (Weight increase > 3lbs).   levothyroxine  (SYNTHROID ) 100 MCG tablet Take 100 mcg by mouth daily before breakfast.   LORazepam (ATIVAN) 1 MG tablet Take 1 mg by mouth at bedtime.   losartan (COZAAR) 25 MG tablet Take 25 mg by mouth daily.   metoprolol tartrate (LOPRESSOR) 25 MG tablet Take 12.5 mg by mouth 2 (two) times daily.   Multiple Vitamin (MULTIVITAMIN+) LIQD Take 5 mLs by mouth daily.   polyethylene glycol  powder (GLYCOLAX/MIRALAX) 17 GM/SCOOP powder Take 119 g by mouth every other day.   POTASSIUM CHLORIDE  PO Take 15 mLs by mouth daily.   Vitamin E 670 MG (1000 UT) CAPS Take 1 capsule by mouth daily.     Allergies:   Patient has no known allergies.   Social History   Socioeconomic History   Marital status: Married    Spouse name: Not on file   Number of children: Not on file   Years of education: Not on file   Highest education level: Not on file  Occupational History   Not on file  Tobacco Use   Smoking status: Former   Smokeless tobacco: Never  Substance and Sexual Activity   Alcohol use: Not on file   Drug use: Not on file   Sexual activity: Not on file  Other Topics Concern   Not on file  Social History Narrative   Not on file   Social Drivers of Health   Financial Resource Strain: Not on file  Food Insecurity: Low Risk  (11/08/2022)   [facility]   Hunger Vital Sign    Worried About Running Out of Food in the Last Year: Never true    Ran Out of Food in the Last Year: Never true  Transportation Needs: Not on file (11/08/2022)  Physical Activity: Not on file  Stress: Not on file  Social Connections: Not on file     Family History: The patient's family history includes Hypertension in his father. There is no history of Heart disease, Diabetes, or Cancer. ROS:   Please see the history of present illness.    All 14 point review of systems negative except as described per history of present illness  EKGs/Labs/Other Studies Reviewed:         Recent Labs: 11/07/2022: Magnesium  1.9 11/25/2022: ALT 17 10/07/2023: BUN 47; Creatinine, Ser 0.80; Potassium 4.6; Sodium 134 10/12/2023: Hemoglobin 14.7; Platelet Count 288  Recent Lipid Panel No results found for: "CHOL", "TRIG", "HDL", "CHOLHDL", "VLDL", "LDLCALC", "LDLDIRECT"  Physical Exam:    VS:  BP 124/78   Pulse (!) 56   Ht 5\' 8"  (1.727 m)   Wt 194 lb 3.2 oz (88.1 kg)   SpO2 95%    BMI 29.53 kg/m     Wt Readings from Last 3 Encounters:  10/15/23 194 lb 3.2 oz (88.1 kg)  10/07/23 199 lb 3.2 oz (90.4 kg)  08/12/23 199 lb 4.8 oz (90.4 kg)     GEN:  Well nourished, well developed in no acute distress HEENT: Normal NECK: No JVD; No carotid bruits LYMPHATICS: No lymphadenopathy CARDIAC: Irregularly irregular, no murmurs, no rubs, no gallops RESPIRATORY:  Clear to auscultation without rales, wheezing or rhonchi  ABDOMEN: Soft, non-tender, non-distended MUSCULOSKELETAL:  No edema; No deformity  SKIN: Warm and dry LOWER EXTREMITIES: no swelling NEUROLOGIC:  Alert and oriented x 3 PSYCHIATRIC:  Normal affect   ASSESSMENT:  1. Essential hypertension   2. Persistent atrial fibrillation (HCC)   3. Dilated cardiomyopathy (HCC)   4. Chronic ITP (idiopathic thrombocytopenia) (HCC)    PLAN:    In order of problems listed above:  Persistent atrial fibrillation, finally got the message from hematology team, will be able to use Eliquis to prevent him from having stroke.  Will bring him back in about 1 month and then we will decide if he can cardioversion. Essential hypertension blood pressure seems to be well-controlled sometimes elevated.  Will check Chem-7 today if Chem-7 is fine we will double the dose of losartan. Dilated cardiomyopathy trying to put him on guideline directed medical therapy. Chronic ITP.  Last platelets were normal.  Will recheck his CBC in about 10 days after Eliquis has be initiated   Medication Adjustments/Labs and Tests Ordered: Current medicines are reviewed at length with the patient today.  Concerns regarding medicines are outlined above.  Orders Placed This Encounter  Procedures   EKG 12-Lead   Medication changes: No orders of the defined types were placed in this encounter.   Signed, Manfred Seed, MD, [facility] 10/15/2023 9:59 AM    [facility]

## 2023-10-15 NOTE — Telephone Encounter (Signed)
 Per Dr. Cosette Dinning need to take losartan every day and keep checking blood pressure, Chem-7 need to be done in about 1 week". My Chart message sent to patient

## 2023-10-15 NOTE — Patient Instructions (Signed)
 Medication Instructions:   START: Eliquis 5mg  1 tablet twice daily   Lab Work: [employer]- today  Your physician recommends that you return for lab work in: 10 days You need to have labs done when you are fasting.  You can come Monday through Friday 8:30 am to 12:00 pm and 1:15 to 4:30. You do not need to make an appointment as the order has already been placed. The labs you are going to have done are  CBC    Testing/Procedures: None Ordered   Follow-Up: At [facility], you and your health needs are our priority.  As part of our continuing mission to provide you with exceptional heart care, we have created designated Provider Care Teams.  These Care Teams include your primary Cardiologist (physician) and Advanced Practice Providers (APPs -  Physician Assistants and Nurse Practitioners) who all work together to provide you with the care you need, when you need it.  We recommend signing up for the patient portal called "MyChart".  Sign up information is provided on this After Visit Summary.  MyChart is used to connect with patients for Virtual Visits (Telemedicine).  Patients are able to view lab/test results, encounter notes, upcoming appointments, etc.  Non-urgent messages can be sent to your provider as well.   To learn more about what you can do with MyChart, go to [email or web address].    Your next appointment:   1 month(s)  The format for your next appointment:   In Person  Provider:   Ralene Burger, MD    Other Instructions NA

## 2023-10-15 NOTE — Addendum Note (Signed)
 Addended by: Shawnee Dellen D on: 10/15/2023 10:10 AM   Modules accepted: Orders

## 2023-10-16 LAB — BASIC METABOLIC PANEL WITH GFR
BUN/Creatinine Ratio: 42 — ABNORMAL HIGH (ref 10–24)
BUN: 35 mg/dL — ABNORMAL HIGH (ref 8–27)
CO2: 21 mmol/L (ref 20–29)
Calcium: 9.3 mg/dL (ref 8.6–10.2)
Chloride: 97 mmol/L (ref 96–106)
Creatinine, Ser: 0.83 mg/dL (ref 0.76–1.27)
Glucose: 81 mg/dL (ref 70–99)
Potassium: 4.6 mmol/L (ref 3.5–5.2)
Sodium: 135 mmol/L (ref 134–144)
eGFR: 92 mL/min/{1.73_m2} (ref >59)

## 2023-10-18 ENCOUNTER — Telehealth: Payer: Self-pay | Admitting: Cardiology

## 2023-10-18 ENCOUNTER — Other Ambulatory Visit: Payer: Self-pay

## 2023-10-18 DIAGNOSIS — I4819 Other persistent atrial fibrillation: Secondary | ICD-10-CM

## 2023-10-18 NOTE — Telephone Encounter (Signed)
 Called the patient's wife Haskell Linker per DPR and she stated that the patient was started on Eliquis  and they were informed of the lab draw when the patient had an appointment with Dr. Gordan Latina last Friday. Haskell Linker had no further questions at this time.

## 2023-10-18 NOTE — Telephone Encounter (Signed)
 Wife Haskell Linker) stated she is returning RN Richard's call.

## 2023-10-18 NOTE — Telephone Encounter (Signed)
 Left message for William Bell, the patients wife, to call back.

## 2023-10-25 ENCOUNTER — Ambulatory Visit: Payer: Self-pay | Admitting: Cardiology

## 2023-10-25 ENCOUNTER — Telehealth: Payer: Self-pay | Admitting: Cardiology

## 2023-10-25 DIAGNOSIS — I10 Essential (primary) hypertension: Secondary | ICD-10-CM

## 2023-10-25 DIAGNOSIS — I4819 Other persistent atrial fibrillation: Secondary | ICD-10-CM | POA: Diagnosis not present

## 2023-10-25 MED ORDER — METOPROLOL TARTRATE 25 MG PO TABS: 12.5000 mg | ORAL_TABLET | Freq: Two times a day (BID) | ORAL | 3 refills | Status: AC

## 2023-10-25 MED ORDER — LOSARTAN POTASSIUM 25 MG PO TABS: 25.0000 mg | ORAL_TABLET | Freq: Every day | ORAL | 3 refills | Status: DC

## 2023-10-25 NOTE — Telephone Encounter (Signed)
 Patient needs refills on metoprolol and losartan sent to [facility] in [place]  Best phone number [phone] when sent

## 2023-10-25 NOTE — Telephone Encounter (Signed)
 Pt's medications were sent to pt's pharmacy as requested. Confirmation received.

## 2023-10-26 LAB — CBC
Hematocrit: 48.8 % (ref 37.5–51.0)
Hemoglobin: 15.1 g/dL (ref 13.0–17.7)
MCH: 30.4 pg (ref 26.6–33.0)
MCHC: 30.9 g/dL — ABNORMAL LOW (ref 31.5–35.7)
MCV: 98 fL — ABNORMAL HIGH (ref 79–97)
Platelets: 187 10*3/uL (ref 150–450)
RBC: 4.97 x10E6/uL (ref 4.14–5.80)
RDW: 14.6 % (ref 11.6–15.4)
WBC: 6.1 10*3/uL (ref 3.4–10.8)

## 2023-10-26 LAB — BASIC METABOLIC PANEL WITH GFR
BUN/Creatinine Ratio: 50 — ABNORMAL HIGH (ref 10–24)
BUN: 41 mg/dL — ABNORMAL HIGH (ref 8–27)
CO2: 21 mmol/L (ref 20–29)
Calcium: 9 mg/dL (ref 8.6–10.2)
Chloride: 96 mmol/L (ref 96–106)
Creatinine, Ser: 0.82 mg/dL (ref 0.76–1.27)
Glucose: 83 mg/dL (ref 70–99)
Potassium: 4.5 mmol/L (ref 3.5–5.2)
Sodium: 134 mmol/L (ref 134–144)
eGFR: 92 mL/min/{1.73_m2} (ref >59)

## 2023-10-28 ENCOUNTER — Inpatient Hospital Stay: Attending: Oncology

## 2023-10-28 ENCOUNTER — Other Ambulatory Visit: Payer: Self-pay | Admitting: Hematology and Oncology

## 2023-10-28 DIAGNOSIS — C760 Malignant neoplasm of head, face and neck: Secondary | ICD-10-CM

## 2023-10-28 DIAGNOSIS — D693 Immune thrombocytopenic purpura: Secondary | ICD-10-CM | POA: Diagnosis not present

## 2023-10-28 MED ORDER — SODIUM CHLORIDE 0.9 % IV SOLN: Freq: Once | INTRAVENOUS | Status: AC

## 2023-10-28 NOTE — Patient Instructions (Signed)
 Dehydration, Adult Dehydration is a condition in which there is not enough water or other fluids in the body. This happens when a person loses more fluids than they take in. Important organs cannot work right without the right amount of fluids. Any loss of fluids from the body can cause dehydration. Dehydration can be mild, worse, or very bad. It should be treated right away to keep it from getting very bad. What are the causes? Conditions that cause loss of water in the body. They include: Watery poop (diarrhea). Vomiting. Sweating a lot. Fever. Infection. Peeing (urinating) a lot. Not drinking enough fluids. Certain medicines, such as medicines that take extra fluid out of the body (diuretics). Lack of safe drinking water. Not being able to get enough water and food. What increases the risk? Having a long-term (chronic) illness that has not been treated the right way, such as: Diabetes. Heart disease. Kidney disease. Being 25 years of age or older. Having a disability. Living in a place that is high above the ground or sea (high in altitude). The thinner, drier air causes more fluid loss. Doing exercises that put stress on your body for a long time. Being active when in hot places. What are the signs or symptoms? Symptoms of dehydration depend on how bad it is. Mild or worse dehydration Thirst. Dry lips or dry mouth. Feeling dizzy or light-headed. Muscle cramps. Passing little pee or dark pee. Pee may be the color of tea. Headache. Very bad dehydration Changes in skin. Skin may: Be cold to the touch (clammy). Be blotchy or pale. Not go back to normal right after you pinch it and let it go. Little or no tears, pee, or sweat. Fast breathing. Low blood pressure. Weak pulse. Pulse that is more than 100 beats a minute when you are sitting still. Other changes, such as: Feeling very thirsty. Eyes that look hollow (sunken). Cold hands and feet. Being confused. Being very  tired (lethargic) or having trouble waking from sleep. Losing weight. Loss of consciousness. How is this treated? Treatment for this condition depends on how bad your dehydration is. Treatment should start right away. Do not wait until your condition gets very bad. Very bad dehydration is an emergency. You will need to go to a hospital. Mild or worse dehydration can be treated at home. You may be asked to: Drink more fluids. Drink an oral rehydration solution (ORS). This drink gives you the right amount of fluids, salts, and minerals (electrolytes). Very bad dehydration can be treated: With fluids through an IV tube. By correcting low levels of electrolytes in the body. By treating the problem that caused your dehydration. Follow these instructions at home: Oral rehydration solution If told by your doctor, drink an ORS: Make an ORS. Use instructions on the package. Start by drinking small amounts, about  cup (120 mL) every 5-10 minutes. Slowly drink more until you have had the amount that your doctor said to have.  Eating and drinking  Drink enough clear fluid to keep your pee pale yellow. If you were told to drink an ORS, finish the ORS first. Then, start slowly drinking other clear fluids. Drink fluids such as: Water. Do not drink only water. Doing that can make the salt (sodium) level in your body get too low. Water from ice chips you suck on. Fruit juice that you have added water to (diluted). Low-calorie sports drinks. Eat foods that have the right amounts of salts and minerals, such as bananas, oranges, potatoes,  tomatoes, or spinach. Do not drink alcohol. Avoid drinks that have caffeine or sugar. These include:: High-calorie sports drinks. Fruit juice that you did not add water to. Soda. Coffee or energy drinks. Avoid foods that are greasy or have a lot of fat or sugar. General instructions Take over-the-counter and prescription medicines only as told by your doctor. Do  not take sodium tablets. Doing that can make the salt level in your body get too high. Return to your normal activities as told by your doctor. Ask your doctor what activities are safe for you. Keep all follow-up visits. Your doctor may check and change your treatment. Contact a doctor if: You have pain in your belly (abdomen) and the pain: Gets worse. Stays in one place. You have a rash. You have a stiff neck. You get angry or annoyed more easily than normal. You are more tired or have a harder time waking than normal. You feel weak or dizzy. You feel very thirsty. Get help right away if: You have any symptoms of very bad dehydration. You vomit every time you eat or drink. Your vomiting gets worse, does not go away, or you vomit blood or green stuff. You are getting treatment, but symptoms are getting worse. You have a fever. You have a very bad headache. You have: Diarrhea that gets worse or does not go away. Blood in your poop (stool). This may cause poop to look black and tarry. No pee in 6-8 hours. Only a small amount of pee in 6-8 hours, and the pee is very dark. You have trouble breathing. These symptoms may be an emergency. Get help right away. Call 911. Do not wait to see if the symptoms will go away. Do not drive yourself to the hospital. This information is not intended to replace advice given to you by your health care provider. Make sure you discuss any questions you have with your health care provider. Document Revised: 12/01/2021 Document Reviewed: 12/01/2021 Elsevier Patient Education  2024 [employer].

## 2023-10-29 ENCOUNTER — Ambulatory Visit: Admitting: Cardiology

## 2023-11-01 ENCOUNTER — Ambulatory Visit: Payer: Self-pay | Admitting: Cardiology

## 2023-11-02 ENCOUNTER — Other Ambulatory Visit: Payer: Self-pay

## 2023-11-03 ENCOUNTER — Inpatient Hospital Stay

## 2023-11-03 ENCOUNTER — Other Ambulatory Visit: Payer: Self-pay

## 2023-11-03 ENCOUNTER — Other Ambulatory Visit: Payer: Self-pay | Admitting: Oncology

## 2023-11-03 ENCOUNTER — Telehealth: Payer: Self-pay | Admitting: Oncology

## 2023-11-03 DIAGNOSIS — D693 Immune thrombocytopenic purpura: Secondary | ICD-10-CM | POA: Diagnosis not present

## 2023-11-03 LAB — CBC WITH DIFFERENTIAL (CANCER CENTER ONLY)
Abs Immature Granulocytes: 0.01 10*3/uL (ref 0.00–0.07)
Basophils Absolute: 0.1 10*3/uL (ref 0.0–0.1)
Basophils Relative: 1 %
Eosinophils Absolute: 0.4 10*3/uL (ref 0.0–0.5)
Eosinophils Relative: 5 %
HCT: 45.1 % (ref 39.0–52.0)
Hemoglobin: 14.9 g/dL (ref 13.0–17.0)
Immature Granulocytes: 0 %
Lymphocytes Relative: 7 %
Lymphs Abs: 0.5 10*3/uL — ABNORMAL LOW (ref 0.7–4.0)
MCH: 29.9 pg (ref 26.0–34.0)
MCHC: 33 g/dL (ref 30.0–36.0)
MCV: 90.6 fL (ref 80.0–100.0)
Monocytes Absolute: 0.7 10*3/uL (ref 0.1–1.0)
Monocytes Relative: 11 %
Neutro Abs: 4.9 10*3/uL (ref 1.7–7.7)
Neutrophils Relative %: 76 %
Platelet Count: 217 10*3/uL (ref 150–400)
RBC: 4.98 MIL/uL (ref 4.22–5.81)
RDW: 16.1 % — ABNORMAL HIGH (ref 11.5–15.5)
WBC Count: 6.5 10*3/uL (ref 4.0–10.5)
nRBC: 0 % (ref 0.0–0.2)

## 2023-11-03 MED ORDER — LOSARTAN POTASSIUM 50 MG PO TABS: 50.0000 mg | ORAL_TABLET | Freq: Every day | ORAL | 3 refills | Status: DC

## 2023-11-03 NOTE — Telephone Encounter (Signed)
 Patient notified of results and recommendations and agreed with plan, medication sent, lab order on file.

## 2023-11-03 NOTE — Telephone Encounter (Signed)
 Contacted pt to schedule an appt. Unable to reach via phone, voicemail was full     Per William Bell: please start scheduling this pt to get a CBC every 3rd Wednesday of each month. I will place the order in his chart as well. thx

## 2023-11-03 NOTE — Telephone Encounter (Signed)
-----   Message from Ralene Burger sent at 10/25/2023 12:20 PM EDT ----- Chem-7 looks good please call them and find out how much losartan  is he taking if he is taking only 25 mg we can go up to 50 mg have Chem-7 checked next week if he takes over the 50 mg do not do anything

## 2023-11-04 DIAGNOSIS — R1312 Dysphagia, oropharyngeal phase: Secondary | ICD-10-CM | POA: Diagnosis not present

## 2023-11-04 DIAGNOSIS — M272 Inflammatory conditions of jaws: Secondary | ICD-10-CM | POA: Diagnosis not present

## 2023-11-04 DIAGNOSIS — C099 Malignant neoplasm of tonsil, unspecified: Secondary | ICD-10-CM | POA: Diagnosis not present

## 2023-11-04 DIAGNOSIS — Z930 Tracheostomy status: Secondary | ICD-10-CM | POA: Diagnosis not present

## 2023-11-04 DIAGNOSIS — Y842 Radiological procedure and radiotherapy as the cause of abnormal reaction of the patient, or of later complication, without mention of misadventure at the time of the procedure: Secondary | ICD-10-CM | POA: Diagnosis not present

## 2023-11-04 NOTE — Telephone Encounter (Signed)
 Patient has been scheduled. Aware of appt date and time.

## 2023-11-16 ENCOUNTER — Ambulatory Visit: Attending: Cardiology | Admitting: Cardiology

## 2023-11-16 VITALS — BP 150/84 | HR 54 | Ht 68.5 in | Wt 194.6 lb

## 2023-11-16 DIAGNOSIS — Z930 Tracheostomy status: Secondary | ICD-10-CM | POA: Diagnosis not present

## 2023-11-16 DIAGNOSIS — I10 Essential (primary) hypertension: Secondary | ICD-10-CM | POA: Insufficient documentation

## 2023-11-16 DIAGNOSIS — I4819 Other persistent atrial fibrillation: Secondary | ICD-10-CM | POA: Diagnosis not present

## 2023-11-16 DIAGNOSIS — I420 Dilated cardiomyopathy: Secondary | ICD-10-CM | POA: Diagnosis not present

## 2023-11-16 MED ORDER — ENTRESTO 24-26 MG PO TABS: 1.0000 | ORAL_TABLET | Freq: Two times a day (BID) | ORAL | 3 refills | Status: DC

## 2023-11-16 NOTE — Patient Instructions (Signed)
 Medication Instructions:   START: Entresto 24-26 1 tablet twice daily   Lab Work: Your physician recommends that you return for lab work in: 1 week You need to have labs done when you are fasting.  You can come Monday through Friday 8:30 am to 12:00 pm and 1:15 to 4:30. You do not need to make an appointment as the order has already been placed. The labs you are going to have done are BMET.    Testing/Procedures: None Ordered   Follow-Up: At [facility], you and your health needs are our priority.  As part of our continuing mission to provide you with exceptional heart care, we have created designated Provider Care Teams.  These Care Teams include your primary Cardiologist (physician) and Advanced Practice Providers (APPs -  Physician Assistants and Nurse Practitioners) who all work together to provide you with the care you need, when you need it.  We recommend signing up for the patient portal called MyChart.  Sign up information is provided on this After Visit Summary.  MyChart is used to connect with patients for Virtual Visits (Telemedicine).  Patients are able to view lab/test results, encounter notes, upcoming appointments, etc.  Non-urgent messages can be sent to your provider as well.   To learn more about what you can do with MyChart, go to [email or web address].    Your next appointment:   2 month(s)  The format for your next appointment:   In Person  Provider:   Lamar Fitch, MD    Other Instructions NA

## 2023-11-16 NOTE — Progress Notes (Signed)
 Cardiology Office Note:    Date:  11/16/2023   ID:  William Bell, DOB 10/24/1950, MRN [id number]  PCP:  Fernand Tracey LABOR, MD  Cardiologist:  Lamar Fitch, MD    Referring MD: Fernand Tracey LABOR, MD   Chief Complaint  Patient presents with   Medication Management    History of Present Illness:    William Bell is a 73 y.o. male past medical history significant for dilated cardiomyopathy nonischemic, history of EtOH abuse, tonsil cancer, status post tracheostomy recently in the being in the hospital because of shortness of breath he was found to be in atrial fibrillation fast ventricular rate also have hypertensive emergency pulmonary edema excellently managed by hospitalist comes today to my office I have seen him few times after being discharged from hospital, we have difficulty initiating anticoagulation because of ITP but seems to be tolerating quite well.  He was fine to be diminished ejection fraction.  Comes today for follow-up doing great asymptomatic no shortness of breath chest pain tightness squeezing pressure burning chest but does have difficulty with high blood pressure  Past Medical History:  Diagnosis Date   ABLA (acute blood loss anemia) 11/07/2022   Acute ITP (HCC) 11/06/2022   Airway obstruction 02/08/2019   Added automatically from request for surgery [id number]     Apnea, sleep 03/04/2016   Last Assessment & Plan:    Use home CPAP.   Tracheostomy planned next week.     Atrial fibrillation (HCC) 10/07/2023   Chronic ITP (idiopathic thrombocytopenia) (HCC)    Refractory to splenectomy   Dilated cardiomyopathy (HCC) 07/23/2020   Essential hypertension 03/04/2016   ETOH abuse 06/13/2018   Head and neck cancer (HCC)    Hemoptysis 07/23/2020   History of DVT (deep vein thrombosis) 03/04/2016   History of ITP 03/04/2016   S/P splenectomy     Hypercholesterolemia 03/04/2016   Last Assessment & Plan:    Patient states was previously on statin but discontinued  some time ago   Lipitor 80 mg daily based on cardiology recs.     Idiopathic thrombocytopenic purpura (ITP) (HCC) 06/14/2020   Malignant neoplasm of tonsillar fossa (HCC) 06/19/2020   Oropharyngeal dysphagia 10/21/2016   Osteoradionecrosis of jaw 03/04/2016   Added automatically from request for surgery [id number]     Pharyngeal lesion 03/10/2021   Added automatically from request for surgery [id number]     Precordial chest pain 07/02/2023   Pulmonary hypertension (HCC) 07/23/2020   S/P gastrostomy (HCC) 06/16/2018   Last Assessment & Plan:    Resume home feeds as before     Thyroid  disease 06/13/2018   Tracheostomy dependence (HCC) 05/06/2019   Tracheostomy hemorrhage (HCC) 11/07/2022    Past Surgical History:  Procedure Laterality Date   TRACHEOSTOMY      Current Medications: Current Meds  Medication Sig   acetaminophen  (TYLENOL ) 500 MG tablet Take 1,000 mg by mouth every 6 (six) hours as needed for mild pain or headache.   apixaban  (ELIQUIS ) 5 MG TABS tablet Take 1 tablet (5 mg total) by mouth 2 (two) times daily.   apixaban  (ELIQUIS ) 5 MG TABS tablet Take 1 tablet (5 mg total) by mouth 2 (two) times daily.   atorvastatin  (LIPITOR) 40 MG tablet Take 40 mg by mouth daily.   citalopram  (CELEXA ) 20 MG tablet Take 20 mg by mouth daily.   co-enzyme Q-10 30 MG capsule Take 30 mg by mouth daily.   furosemide  (LASIX ) 40 MG tablet Take 40 mg by  mouth daily as needed (Weight increase > 3lbs).   levothyroxine  (SYNTHROID ) 100 MCG tablet Take 100 mcg by mouth daily before breakfast.   LORazepam (ATIVAN) 1 MG tablet Take 1 mg by mouth at bedtime.   losartan  (COZAAR ) 50 MG tablet Take 1 tablet (50 mg total) by mouth daily.   metoprolol  tartrate (LOPRESSOR ) 25 MG tablet Take 0.5 tablets (12.5 mg total) by mouth 2 (two) times daily.   Multiple Vitamin (MULTIVITAMIN+) LIQD Take 5 mLs by mouth daily.   polyethylene glycol powder (GLYCOLAX/MIRALAX) 17 GM/SCOOP powder Take 119 g by mouth every other  day.   POTASSIUM CHLORIDE  PO Take 15 mLs by mouth daily.   Vitamin E 670 MG (1000 UT) CAPS Take 1 capsule by mouth daily.     Allergies:   Patient has no known allergies.   Social History   Socioeconomic History   Marital status: Married    Spouse name: Not on file   Number of children: Not on file   Years of education: Not on file   Highest education level: Not on file  Occupational History   Not on file  Tobacco Use   Smoking status: Former   Smokeless tobacco: Never  Substance and Sexual Activity   Alcohol use: Not on file   Drug use: Not on file   Sexual activity: Not on file  Other Topics Concern   Not on file  Social History Narrative   Not on file   Social Drivers of Health   Financial Resource Strain: Not on file  Food Insecurity: Low Risk  (11/08/2022)   Received from [facility]   Hunger Vital Sign    Within the past 12 months, you worried that your food would run out before you got money to buy more: Never true    Within the past 12 months, the food you bought just didn't last and you didn't have money to get more. : Never true  Transportation Needs: Not on file (11/08/2022)  Physical Activity: Not on file  Stress: Not on file  Social Connections: Not on file     Family History: The patient's family history includes Hypertension in his father. There is no history of Heart disease, Diabetes, or Cancer. ROS:   Please see the history of present illness.    All 14 point review of systems negative except as described per history of present illness  EKGs/Labs/Other Studies Reviewed:         Recent Labs: 11/25/2022: ALT 17 10/25/2023: BUN 41; Creatinine, Ser 0.82; Potassium 4.5; Sodium 134 11/03/2023: Hemoglobin 14.9; Platelet Count 217  Recent Lipid Panel No results found for: CHOL, TRIG, HDL, CHOLHDL, VLDL, LDLCALC, LDLDIRECT  Physical Exam:    VS:  BP (!) 150/84 (BP Location: Right Arm, Patient Position: Sitting)   Pulse (!) 54   Ht 5'  8.5 (1.74 m)   Wt 194 lb 9.6 oz (88.3 kg)   SpO2 (!) 89%   BMI 29.16 kg/m     Wt Readings from Last 3 Encounters:  11/16/23 194 lb 9.6 oz (88.3 kg)  10/15/23 194 lb 3.2 oz (88.1 kg)  10/07/23 199 lb 3.2 oz (90.4 kg)     GEN:  Well nourished, well developed in no acute distress HEENT: Normal NECK: No JVD; No carotid bruits LYMPHATICS: No lymphadenopathy CARDIAC: Irregularly irregular, no murmurs, no rubs, no gallops RESPIRATORY:  Clear to auscultation without rales, wheezing or rhonchi  ABDOMEN: Soft, non-tender, non-distended MUSCULOSKELETAL:  No edema; No deformity  SKIN:  Warm and dry LOWER EXTREMITIES: no swelling NEUROLOGIC:  Alert and oriented x 3 PSYCHIATRIC:  Normal affect   ASSESSMENT:    1. Persistent atrial fibrillation (HCC)   2. Dilated cardiomyopathy (HCC)   3. Essential hypertension   4. Tracheostomy dependence (HCC)    PLAN:    In order of problems listed above:  Atrial fibrillation persistent.  Will do EKG to confirm discontinue anticoagulation we debating about cardioversion he need to have TEE before cardioversion.  But because of his tonsil cancer and surgery that may be difficult or problematic.  Will decide later. History of dilated cardiomyopathy with switching from losartan  to Entresto we will repeat echocardiogram about 2 months. Essential hypertension elevated hopefully switching to Entresto will help. Tracheostomy dependent.  Not at   Medication Adjustments/Labs and Tests Ordered: Current medicines are reviewed at length with the patient today.  Concerns regarding medicines are outlined above.  Orders Placed This Encounter  Procedures   EKG 12-Lead   Medication changes: No orders of the defined types were placed in this encounter.   Signed, Lamar DOROTHA Fitch, MD, [facility] 11/16/2023 2:16 PM    [facility]

## 2023-11-23 DIAGNOSIS — I10 Essential (primary) hypertension: Secondary | ICD-10-CM | POA: Diagnosis not present

## 2023-11-24 LAB — BASIC METABOLIC PANEL WITH GFR
BUN/Creatinine Ratio: 48 — ABNORMAL HIGH (ref 10–24)
BUN: 36 mg/dL — ABNORMAL HIGH (ref 8–27)
CO2: 23 mmol/L (ref 20–29)
Calcium: 9.2 mg/dL (ref 8.6–10.2)
Chloride: 96 mmol/L (ref 96–106)
Creatinine, Ser: 0.75 mg/dL — ABNORMAL LOW (ref 0.76–1.27)
Glucose: 121 mg/dL — ABNORMAL HIGH (ref 70–99)
Potassium: 4.6 mmol/L (ref 3.5–5.2)
Sodium: 135 mmol/L (ref 134–144)
eGFR: 95 mL/min/1.73 (ref >59)

## 2023-11-25 DIAGNOSIS — Z431 Encounter for attention to gastrostomy: Secondary | ICD-10-CM | POA: Diagnosis not present

## 2023-11-25 DIAGNOSIS — Z4659 Encounter for fitting and adjustment of other gastrointestinal appliance and device: Secondary | ICD-10-CM | POA: Diagnosis not present

## 2023-11-26 ENCOUNTER — Ambulatory Visit: Payer: Self-pay | Admitting: Cardiology

## 2023-11-30 ENCOUNTER — Telehealth: Payer: Self-pay

## 2023-11-30 NOTE — Telephone Encounter (Signed)
 Left message on My Chart with lab results per Dr. Vanetta Shawl note. Routed to PCP.

## 2023-12-01 ENCOUNTER — Inpatient Hospital Stay: Attending: Oncology

## 2023-12-01 ENCOUNTER — Telehealth: Payer: Self-pay

## 2023-12-01 DIAGNOSIS — D693 Immune thrombocytopenic purpura: Secondary | ICD-10-CM | POA: Diagnosis not present

## 2023-12-01 LAB — CBC WITH DIFFERENTIAL (CANCER CENTER ONLY)
Abs Immature Granulocytes: 0.01 K/uL (ref 0.00–0.07)
Basophils Absolute: 0.1 K/uL (ref 0.0–0.1)
Basophils Relative: 1 %
Eosinophils Absolute: 0.5 K/uL (ref 0.0–0.5)
Eosinophils Relative: 7 %
HCT: 43.2 % (ref 39.0–52.0)
Hemoglobin: 14.4 g/dL (ref 13.0–17.0)
Immature Granulocytes: 0 %
Lymphocytes Relative: 8 %
Lymphs Abs: 0.6 K/uL — ABNORMAL LOW (ref 0.7–4.0)
MCH: 30.2 pg (ref 26.0–34.0)
MCHC: 33.3 g/dL (ref 30.0–36.0)
MCV: 90.6 fL (ref 80.0–100.0)
Monocytes Absolute: 0.7 K/uL (ref 0.1–1.0)
Monocytes Relative: 11 %
Neutro Abs: 4.9 K/uL (ref 1.7–7.7)
Neutrophils Relative %: 73 %
Platelet Count: 258 K/uL (ref 150–400)
RBC: 4.77 MIL/uL (ref 4.22–5.81)
RDW: 16.9 % — ABNORMAL HIGH (ref 11.5–15.5)
WBC Count: 6.7 K/uL (ref 4.0–10.5)
nRBC: 0 % (ref 0.0–0.2)

## 2023-12-01 NOTE — Telephone Encounter (Signed)
 Pt viewed lab results on My Chart per Dr. Vanetta Shawl note. Routed to PCP.

## 2023-12-13 DIAGNOSIS — Z4659 Encounter for fitting and adjustment of other gastrointestinal appliance and device: Secondary | ICD-10-CM | POA: Diagnosis not present

## 2023-12-15 ENCOUNTER — Telehealth: Payer: Self-pay | Admitting: Cardiology

## 2023-12-15 NOTE — Telephone Encounter (Signed)
 Pt c/o BP issue: STAT if pt c/o blurred vision, one-sided weakness or slurred speech  1. What are your last 5 BP readings? 140/97  2. Are you having any other symptoms (ex. Dizziness, headache, blurred vision, passed out)? no  3. What is your BP issue? Running high  Patient is calling to speak with the nurse. Please advise

## 2023-12-16 ENCOUNTER — Telehealth: Payer: Self-pay

## 2023-12-16 ENCOUNTER — Ambulatory Visit: Attending: Cardiology

## 2023-12-16 DIAGNOSIS — R030 Elevated blood-pressure reading, without diagnosis of hypertension: Secondary | ICD-10-CM

## 2023-12-16 DIAGNOSIS — I10 Essential (primary) hypertension: Secondary | ICD-10-CM

## 2023-12-16 NOTE — Telephone Encounter (Signed)
 Per Dr. Krasowski- 24 hour BP monitor ordered.

## 2023-12-16 NOTE — Telephone Encounter (Signed)
 Per Dr. Krasowski. Pt needs 24 hour BP monitor.

## 2023-12-17 ENCOUNTER — Encounter: Payer: Self-pay | Admitting: Hematology and Oncology

## 2023-12-24 ENCOUNTER — Telehealth: Payer: Self-pay | Admitting: Cardiology

## 2023-12-24 NOTE — Telephone Encounter (Signed)
 Patient calling in about his results from his bp monitor. Please advise

## 2023-12-24 NOTE — Telephone Encounter (Signed)
 Per [facility] detailed message left that results are not available at this time.

## 2023-12-28 DIAGNOSIS — R030 Elevated blood-pressure reading, without diagnosis of hypertension: Secondary | ICD-10-CM | POA: Diagnosis not present

## 2023-12-30 ENCOUNTER — Ambulatory Visit: Payer: Self-pay | Admitting: Cardiology

## 2023-12-30 ENCOUNTER — Telehealth: Payer: Self-pay

## 2023-12-30 NOTE — Telephone Encounter (Signed)
 Left message on My Chart with lab results per Dr. Karry note. Routed to PCP

## 2023-12-31 ENCOUNTER — Telehealth: Payer: Self-pay

## 2023-12-31 NOTE — Telephone Encounter (Signed)
 Pt viewed BP monitor results on My Chart per Dr. Karry note. Routed to PCP.

## 2024-01-05 ENCOUNTER — Inpatient Hospital Stay: Attending: Oncology

## 2024-01-05 DIAGNOSIS — D693 Immune thrombocytopenic purpura: Secondary | ICD-10-CM | POA: Diagnosis not present

## 2024-01-05 LAB — CBC WITH DIFFERENTIAL (CANCER CENTER ONLY)
Abs Immature Granulocytes: 0.02 K/uL (ref 0.00–0.07)
Basophils Absolute: 0.1 K/uL (ref 0.0–0.1)
Basophils Relative: 1 %
Eosinophils Absolute: 0.5 K/uL (ref 0.0–0.5)
Eosinophils Relative: 7 %
HCT: 46 % (ref 39.0–52.0)
Hemoglobin: 15.2 g/dL (ref 13.0–17.0)
Immature Granulocytes: 0 %
Lymphocytes Relative: 10 %
Lymphs Abs: 0.7 K/uL (ref 0.7–4.0)
MCH: 30 pg (ref 26.0–34.0)
MCHC: 33 g/dL (ref 30.0–36.0)
MCV: 90.7 fL (ref 80.0–100.0)
Monocytes Absolute: 0.8 K/uL (ref 0.1–1.0)
Monocytes Relative: 11 %
Neutro Abs: 5 K/uL (ref 1.7–7.7)
Neutrophils Relative %: 71 %
Platelet Count: 241 K/uL (ref 150–400)
RBC: 5.07 MIL/uL (ref 4.22–5.81)
RDW: 18.8 % — ABNORMAL HIGH (ref 11.5–15.5)
WBC Count: 7.1 K/uL (ref 4.0–10.5)
nRBC: 0 % (ref 0.0–0.2)

## 2024-01-06 DIAGNOSIS — E039 Hypothyroidism, unspecified: Secondary | ICD-10-CM | POA: Diagnosis not present

## 2024-01-13 DIAGNOSIS — I5020 Unspecified systolic (congestive) heart failure: Secondary | ICD-10-CM | POA: Diagnosis not present

## 2024-01-13 DIAGNOSIS — E039 Hypothyroidism, unspecified: Secondary | ICD-10-CM | POA: Diagnosis not present

## 2024-01-13 DIAGNOSIS — I482 Chronic atrial fibrillation, unspecified: Secondary | ICD-10-CM | POA: Diagnosis not present

## 2024-01-13 DIAGNOSIS — Z930 Tracheostomy status: Secondary | ICD-10-CM | POA: Diagnosis not present

## 2024-01-22 DIAGNOSIS — L739 Follicular disorder, unspecified: Secondary | ICD-10-CM | POA: Diagnosis not present

## 2024-01-22 DIAGNOSIS — L219 Seborrheic dermatitis, unspecified: Secondary | ICD-10-CM | POA: Diagnosis not present

## 2024-01-31 ENCOUNTER — Ambulatory Visit: Attending: Cardiology | Admitting: Cardiology

## 2024-01-31 ENCOUNTER — Encounter: Payer: Self-pay | Admitting: Cardiology

## 2024-01-31 VITALS — BP 140/80 | HR 71 | Ht 68.5 in | Wt 197.0 lb

## 2024-01-31 DIAGNOSIS — I4819 Other persistent atrial fibrillation: Secondary | ICD-10-CM | POA: Insufficient documentation

## 2024-01-31 DIAGNOSIS — I420 Dilated cardiomyopathy: Secondary | ICD-10-CM | POA: Diagnosis not present

## 2024-01-31 DIAGNOSIS — Z930 Tracheostomy status: Secondary | ICD-10-CM | POA: Diagnosis not present

## 2024-01-31 DIAGNOSIS — I10 Essential (primary) hypertension: Secondary | ICD-10-CM | POA: Diagnosis not present

## 2024-01-31 DIAGNOSIS — D693 Immune thrombocytopenic purpura: Secondary | ICD-10-CM | POA: Diagnosis not present

## 2024-01-31 DIAGNOSIS — R0609 Other forms of dyspnea: Secondary | ICD-10-CM | POA: Diagnosis not present

## 2024-01-31 DIAGNOSIS — J988 Other specified respiratory disorders: Secondary | ICD-10-CM | POA: Insufficient documentation

## 2024-01-31 DIAGNOSIS — Z931 Gastrostomy status: Secondary | ICD-10-CM | POA: Insufficient documentation

## 2024-01-31 NOTE — Addendum Note (Signed)
 Addended by: ARLOA PLANAS D on: 01/31/2024 01:35 PM   Modules accepted: Orders

## 2024-01-31 NOTE — Progress Notes (Signed)
 Cardiology Office Note:    Date:  01/31/2024   ID:  Alm William Bell, DOB November 25, 1950, MRN [id number]  PCP:  Fernand Tracey LABOR, MD  Cardiologist:  Lamar Fitch, MD    Referring MD: Fernand Tracey LABOR, MD   Chief Complaint  Patient presents with   Follow-up    History of Present Illness:    William Bell is a 73 y.o. male past medical history significant for dilated cardiomyopathy, nonischemic, history of atrial changes, tonsillar cancer, status post tracheostomy, recently being in the hospital because of shortness of breath he was find to be in atrial fibrillation fast ventricular rate also got hypertensive emergency, pulmonary edema, excellently managed by hospitalist after that follow-up up with us  problem with initiating anticoagulation because of ITP but he seems to be doing fine, he is gradually being put on guideline directed medical therapy.  Difficulty with his atrial fibrillation as potential cardioversion ideally he need to have TEE but because of his extensive throat surgery that would be technically very difficult.  Comes today to my office complain of having shortness of breath but otherwise doing fine  Past Medical History:  Diagnosis Date   ABLA (acute blood loss anemia) 11/07/2022   Acute ITP (HCC) 11/06/2022   Airway obstruction 02/08/2019   Added automatically from request for surgery [id number]     Apnea, sleep 03/04/2016   Last Assessment & Plan:    Use home CPAP.   Tracheostomy planned next week.     Atrial fibrillation (HCC) 10/07/2023   Chronic ITP (idiopathic thrombocytopenia) (HCC)    Refractory to splenectomy   Dilated cardiomyopathy (HCC) 07/23/2020   Essential hypertension 03/04/2016   ETOH abuse 06/13/2018   Head and neck cancer (HCC)    Hemoptysis 07/23/2020   History of DVT (deep vein thrombosis) 03/04/2016   History of ITP 03/04/2016   S/P splenectomy     Hypercholesterolemia 03/04/2016   Last Assessment & Plan:    Patient states was  previously on statin but discontinued some time ago   Lipitor 80 mg daily based on cardiology recs.     Idiopathic thrombocytopenic purpura (ITP) (HCC) 06/14/2020   Malignant neoplasm of tonsillar fossa (HCC) 06/19/2020   Oropharyngeal dysphagia 10/21/2016   Osteoradionecrosis of jaw 03/04/2016   Added automatically from request for surgery [id number]     Pharyngeal lesion 03/10/2021   Added automatically from request for surgery [id number]     Precordial chest pain 07/02/2023   Pulmonary hypertension (HCC) 07/23/2020   S/P gastrostomy (HCC) 06/16/2018   Last Assessment & Plan:    Resume home feeds as before     Thyroid  disease 06/13/2018   Tracheostomy dependence (HCC) 05/06/2019   Tracheostomy hemorrhage (HCC) 11/07/2022    Past Surgical History:  Procedure Laterality Date   TRACHEOSTOMY      Current Medications: Current Meds  Medication Sig   acetaminophen  (TYLENOL ) 500 MG tablet Take 1,000 mg by mouth every 6 (six) hours as needed for mild pain or headache.   apixaban  (ELIQUIS ) 5 MG TABS tablet Take 1 tablet (5 mg total) by mouth 2 (two) times daily.   atorvastatin  (LIPITOR) 40 MG tablet Take 40 mg by mouth daily.   citalopram  (CELEXA ) 20 MG tablet Take 20 mg by mouth daily.   co-enzyme Q-10 30 MG capsule Take 30 mg by mouth daily.   furosemide  (LASIX ) 40 MG tablet Take 40 mg by mouth daily as needed (Weight increase > 3lbs).   ketoconazole (NIZORAL) 2 % shampoo Apply  1 Application topically 2 (two) times a week.   levothyroxine  (SYNTHROID ) 100 MCG tablet Take 100 mcg by mouth daily before breakfast.   LORazepam (ATIVAN) 1 MG tablet Take 1 mg by mouth at bedtime.   metoprolol  tartrate (LOPRESSOR ) 25 MG tablet Take 0.5 tablets (12.5 mg total) by mouth 2 (two) times daily.   Multiple Vitamin (MULTIVITAMIN+) LIQD Take 5 mLs by mouth daily.   polyethylene glycol powder (GLYCOLAX/MIRALAX) 17 GM/SCOOP powder Take 119 g by mouth every other day.   POTASSIUM CHLORIDE  PO Take 15 mLs by  mouth daily.   sacubitril -valsartan  (ENTRESTO ) 24-26 MG Take 1 tablet by mouth 2 (two) times daily.   Vitamin E 670 MG (1000 UT) CAPS Take 1 capsule by mouth daily.     Allergies:   Patient has no known allergies.   Social History   Socioeconomic History   Marital status: Married    Spouse name: Not on file   Number of children: Not on file   Years of education: Not on file   Highest education level: Not on file  Occupational History   Not on file  Tobacco Use   Smoking status: Former   Smokeless tobacco: Never  Substance and Sexual Activity   Alcohol use: Not on file   Drug use: Not on file   Sexual activity: Not on file  Other Topics Concern   Not on file  Social History Narrative   Not on file   Social Drivers of Health   Financial Resource Strain: Not on file  Food Insecurity: Low Risk  (11/08/2022)   Received from [facility]   Hunger Vital Sign    Within the past 12 months, you worried that your food would run out before you got money to buy more: Never true    Within the past 12 months, the food you bought just didn't last and you didn't have money to get more. : Never true  Transportation Needs: Not on file (11/08/2022)  Physical Activity: Not on file  Stress: Not on file  Social Connections: Not on file     Family History: The patient's family history includes Hypertension in his father. There is no history of Heart disease, Diabetes, or Cancer. ROS:   Please see the history of present illness.    All 14 point review of systems negative except as described per history of present illness  EKGs/Labs/Other Studies Reviewed:         Recent Labs: 11/23/2023: BUN 36; Creatinine, Ser 0.75; Potassium 4.6; Sodium 135 01/05/2024: Hemoglobin 15.2; Platelet Count 241  Recent Lipid Panel No results found for: CHOL, TRIG, HDL, CHOLHDL, VLDL, LDLCALC, LDLDIRECT  Physical Exam:    VS:  BP (!) 140/80   Pulse 71   Ht 5' 8.5 (1.74 m)   Wt 197 lb  (89.4 kg)   SpO2 96%   BMI 29.52 kg/m     Wt Readings from Last 3 Encounters:  01/31/24 197 lb (89.4 kg)  11/16/23 194 lb 9.6 oz (88.3 kg)  10/15/23 194 lb 3.2 oz (88.1 kg)     GEN:  Well nourished, well developed in no acute distress HEENT: Normal NECK: No JVD; No carotid bruits LYMPHATICS: No lymphadenopathy CARDIAC: Irregularly irregular, no murmurs, no rubs, no gallops RESPIRATORY:  Clear to auscultation without rales, wheezing or rhonchi  ABDOMEN: Soft, non-tender, non-distended MUSCULOSKELETAL:  No edema; No deformity  SKIN: Warm and dry LOWER EXTREMITIES: no swelling NEUROLOGIC:  Alert and oriented x 3 PSYCHIATRIC:  Normal  affect   ASSESSMENT:    1. Dilated cardiomyopathy (HCC)   2. Persistent atrial fibrillation (HCC)   3. Essential hypertension   4. Airway obstruction   5. Chronic ITP (idiopathic thrombocytopenia) (HCC)   6. S/P gastrostomy (HCC)   7. Tracheostomy dependence (HCC)    PLAN:    In order of problems listed above:  Dilated cardiomyopathy, intractable trying to put him on guideline directed medical therapy with some difficulty because of kidney function and blood pressure.  I will check Chem-7 today if Chem-7 is fine we will double the dose of Entresto , I will also schedule him to have another echocardiogram to recheck left ventricle ejection fraction. Essential hypertension blood pressure well-controlled this time continue present management. ITP.  Will do CBC, Tracheostomy present.  Noted   Medication Adjustments/Labs and Tests Ordered: Current medicines are reviewed at length with the patient today.  Concerns regarding medicines are outlined above.  No orders of the defined types were placed in this encounter.  Medication changes: No orders of the defined types were placed in this encounter.   Signed, Lamar DOROTHA Fitch, MD, [facility] 01/31/2024 1:25 PM    [facility]

## 2024-01-31 NOTE — Patient Instructions (Signed)
 Medication Instructions:  Your physician recommends that you continue on your current medications as directed. Please refer to the Current Medication list given to you today.  *If you need a refill on your cardiac medications before your next appointment, please call your pharmacy*   Lab Work: BMP, CBC, ProBNP- today If you have labs (blood work) drawn today and your tests are completely normal, you will receive your results only by: MyChart Message (if you have MyChart) OR A paper copy in the mail If you have any lab test that is abnormal or we need to change your treatment, we will call you to review the results.   Testing/Procedures: Your physician has requested that you have an echocardiogram. Echocardiography is a painless test that uses sound waves to create images of your heart. It provides your doctor with information about the size and shape of your heart and how well your heart's chambers and valves are working. This procedure takes approximately one hour. There are no restrictions for this procedure. Please do NOT wear cologne, perfume, aftershave, or lotions (deodorant is allowed). Please arrive 15 minutes prior to your appointment time.  Please note: We ask at that you not bring children with you during ultrasound (echo/ vascular) testing. Due to room size and safety concerns, children are not allowed in the ultrasound rooms during exams. Our front office staff cannot provide observation of children in our lobby area while testing is being conducted. An adult accompanying a patient to their appointment will only be allowed in the ultrasound room at the discretion of the ultrasound technician under special circumstances. We apologize for any inconvenience.    Follow-Up: At [facility], you and your health needs are our priority.  As part of our continuing mission to provide you with exceptional heart care, we have created designated Provider Care Teams.  These Care Teams include  your primary Cardiologist (physician) and Advanced Practice Providers (APPs -  Physician Assistants and Nurse Practitioners) who all work together to provide you with the care you need, when you need it.  We recommend signing up for the patient portal called MyChart.  Sign up information is provided on this After Visit Summary.  MyChart is used to connect with patients for Virtual Visits (Telemedicine).  Patients are able to view lab/test results, encounter notes, upcoming appointments, etc.  Non-urgent messages can be sent to your provider as well.   To learn more about what you can do with MyChart, go to [email or web address].    Your next appointment:   4 month(s)  The format for your next appointment:   In Person  Provider:   Lamar Fitch, MD    Other Instructions NA

## 2024-02-01 LAB — CBC
Hematocrit: 46.4 % (ref 37.5–51.0)
Hemoglobin: 15.2 g/dL (ref 13.0–17.7)
MCH: 31.5 pg (ref 26.6–33.0)
MCHC: 32.8 g/dL (ref 31.5–35.7)
MCV: 96 fL (ref 79–97)
Platelets: 214 x10E3/uL (ref 150–450)
RBC: 4.82 x10E6/uL (ref 4.14–5.80)
RDW: 16.4 % — ABNORMAL HIGH (ref 11.6–15.4)
WBC: 6.1 x10E3/uL (ref 3.4–10.8)

## 2024-02-01 LAB — BASIC METABOLIC PANEL WITH GFR
BUN/Creatinine Ratio: 53 — ABNORMAL HIGH (ref 10–24)
BUN: 34 mg/dL — ABNORMAL HIGH (ref 8–27)
CO2: 24 mmol/L (ref 20–29)
Calcium: 9.3 mg/dL (ref 8.6–10.2)
Chloride: 93 mmol/L — ABNORMAL LOW (ref 96–106)
Creatinine, Ser: 0.64 mg/dL — ABNORMAL LOW (ref 0.76–1.27)
Glucose: 101 mg/dL — ABNORMAL HIGH (ref 70–99)
Potassium: 5 mmol/L (ref 3.5–5.2)
Sodium: 131 mmol/L — ABNORMAL LOW (ref 134–144)
eGFR: 99 mL/min/1.73 (ref >59)

## 2024-02-01 LAB — PRO B NATRIURETIC PEPTIDE: NT-Pro BNP: 6680 pg/mL — ABNORMAL HIGH (ref 0–486)

## 2024-02-02 ENCOUNTER — Encounter: Payer: Self-pay | Admitting: Hematology and Oncology

## 2024-02-03 ENCOUNTER — Ambulatory Visit: Payer: Self-pay | Admitting: Cardiology

## 2024-02-03 DIAGNOSIS — I420 Dilated cardiomyopathy: Secondary | ICD-10-CM

## 2024-02-03 MED ORDER — SACUBITRIL-VALSARTAN 49-51 MG PO TABS: 1.0000 | ORAL_TABLET | Freq: Two times a day (BID) | ORAL | 12 refills | Status: AC

## 2024-02-03 NOTE — Telephone Encounter (Signed)
-----   Message from Lamar Fitch sent at 02/03/2024  8:20 AM EDT ----- Labs are looking good, please double the dose of Entresto , and also cut down the dose of potassium by half, Chem-7 1 week ----- Message ----- From: Interface, [facility] Lab Results In Sent: 02/01/2024   9:36 AM EDT To: Lamar JINNY Fitch, MD

## 2024-02-03 NOTE — Telephone Encounter (Signed)
 Results reviewed with pt as per Dr. Karry note.  Pt verbalized understanding and had no additional questions. Routed to PCP  Pt states he has not been taking potassium (removed from med list).

## 2024-02-09 ENCOUNTER — Other Ambulatory Visit: Payer: Self-pay

## 2024-02-09 ENCOUNTER — Inpatient Hospital Stay: Attending: Oncology

## 2024-02-09 ENCOUNTER — Other Ambulatory Visit: Payer: Self-pay | Admitting: Gastroenterology

## 2024-02-09 DIAGNOSIS — D693 Immune thrombocytopenic purpura: Secondary | ICD-10-CM | POA: Diagnosis not present

## 2024-02-09 DIAGNOSIS — Z7901 Long term (current) use of anticoagulants: Secondary | ICD-10-CM | POA: Diagnosis not present

## 2024-02-09 DIAGNOSIS — Z9081 Acquired absence of spleen: Secondary | ICD-10-CM | POA: Diagnosis not present

## 2024-02-09 DIAGNOSIS — I4891 Unspecified atrial fibrillation: Secondary | ICD-10-CM | POA: Diagnosis not present

## 2024-02-09 DIAGNOSIS — C090 Malignant neoplasm of tonsillar fossa: Secondary | ICD-10-CM

## 2024-02-09 DIAGNOSIS — D6959 Other secondary thrombocytopenia: Secondary | ICD-10-CM | POA: Diagnosis not present

## 2024-02-09 LAB — CBC WITH DIFFERENTIAL (CANCER CENTER ONLY)
Abs Immature Granulocytes: 0.02 K/uL (ref 0.00–0.07)
Basophils Absolute: 0.1 K/uL (ref 0.0–0.1)
Basophils Relative: 1 %
Eosinophils Absolute: 0.2 K/uL (ref 0.0–0.5)
Eosinophils Relative: 2 %
HCT: 43.8 % (ref 39.0–52.0)
Hemoglobin: 15.1 g/dL (ref 13.0–17.0)
Immature Granulocytes: 0 %
Lymphocytes Relative: 7 %
Lymphs Abs: 0.5 K/uL — ABNORMAL LOW (ref 0.7–4.0)
MCH: 31.3 pg (ref 26.0–34.0)
MCHC: 34.5 g/dL (ref 30.0–36.0)
MCV: 90.9 fL (ref 80.0–100.0)
Monocytes Absolute: 0.9 K/uL (ref 0.1–1.0)
Monocytes Relative: 12 %
Neutro Abs: 5.8 K/uL (ref 1.7–7.7)
Neutrophils Relative %: 78 %
Platelet Count: 205 K/uL (ref 150–400)
RBC: 4.82 MIL/uL (ref 4.22–5.81)
RDW: 19.4 % — ABNORMAL HIGH (ref 11.5–15.5)
WBC Count: 7.5 K/uL (ref 4.0–10.5)
nRBC: 0 % (ref 0.0–0.2)

## 2024-02-09 LAB — CMP (CANCER CENTER ONLY)
ALT: 15 U/L (ref 0–44)
AST: 26 U/L (ref 15–41)
Albumin: 3.6 g/dL (ref 3.5–5.0)
Alkaline Phosphatase: 139 U/L — ABNORMAL HIGH (ref 38–126)
Anion gap: 11 (ref 5–15)
BUN: 35 mg/dL — ABNORMAL HIGH (ref 8–23)
CO2: 27 mmol/L (ref 22–32)
Calcium: 9.3 mg/dL (ref 8.9–10.3)
Chloride: 94 mmol/L — ABNORMAL LOW (ref 98–111)
Creatinine: 0.71 mg/dL (ref 0.61–1.24)
GFR, Estimated: >60 mL/min (ref >60)
Glucose, Bld: 124 mg/dL — ABNORMAL HIGH (ref 70–99)
Potassium: 4.4 mmol/L (ref 3.5–5.1)
Sodium: 132 mmol/L — ABNORMAL LOW (ref 135–145)
Total Bilirubin: 1 mg/dL (ref 0.0–1.2)
Total Protein: 6.8 g/dL (ref 6.5–8.1)

## 2024-02-09 LAB — SAMPLE TO BLOOD BANK

## 2024-02-10 DIAGNOSIS — I420 Dilated cardiomyopathy: Secondary | ICD-10-CM | POA: Diagnosis not present

## 2024-02-11 LAB — BASIC METABOLIC PANEL WITH GFR
BUN/Creatinine Ratio: 48 — ABNORMAL HIGH (ref 10–24)
BUN: 33 mg/dL — ABNORMAL HIGH (ref 8–27)
CO2: 22 mmol/L (ref 20–29)
Calcium: 9.4 mg/dL (ref 8.6–10.2)
Chloride: 93 mmol/L — ABNORMAL LOW (ref 96–106)
Creatinine, Ser: 0.69 mg/dL — ABNORMAL LOW (ref 0.76–1.27)
Glucose: 77 mg/dL (ref 70–99)
Potassium: 4.6 mmol/L (ref 3.5–5.2)
Sodium: 134 mmol/L (ref 134–144)
eGFR: 97 mL/min/1.73 (ref >59)

## 2024-02-13 NOTE — Progress Notes (Unsigned)
 [facility]  [address] [place],  [place]  [address] [phone]  Clinic Day: 08/12/2023  Referring physician: Fernand Tracey LABOR, MD   HISTORY OF PRESENT ILLNESS:  The patient is a 73 y.o. male with splenectomy-refractory ITP.  The patient recently had a severe relapse of his ITP, which required weekly Nplate  and Rituxan  in July 2024.  Since then, his platelets have normalized.  He comes in today for repeat clinical assessment.  Since his last visit, the patient has been doing okay.  He denies having any subcutaneous bleeding/bruising issues which concern him for a return of his severe thrombocytopenia from his ITP.    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  The patient completed radiation for this cancer in April 2017.   PHYSICAL EXAM:  There were no vitals taken for this visit. Wt Readings from Last 3 Encounters:  01/31/24 197 lb (89.4 kg)  11/16/23 194 lb 9.6 oz (88.3 kg)  10/15/23 194 lb 3.2 oz (88.1 kg)   There is no height or weight on file to calculate BMI. Performance status (ECOG): 2 - Symptomatic, <50% confined to bed Physical Exam Constitutional:      Appearance: Normal appearance. He is not ill-appearing.     Comments: A chronic tracheostomy tube in his neck  HENT:     Mouth/Throat:     Mouth: Mucous membranes are moist.     Pharynx: Oropharynx is clear. No oropharyngeal exudate or posterior oropharyngeal erythema.  Cardiovascular:     Rate and Rhythm: Normal rate and regular rhythm.     Heart sounds: No murmur heard.    No friction rub. No gallop.  Pulmonary:     Effort: Pulmonary effort is normal. No respiratory distress.     Breath sounds: Normal breath sounds. No wheezing, rhonchi or rales.  Abdominal:     General: Bowel sounds are normal. There is no distension.     Palpations: Abdomen is soft. There is no mass.     Tenderness: There is no abdominal  tenderness.  Musculoskeletal:        General: No swelling.     Right lower leg: No edema.     Left lower leg: No edema.  Lymphadenopathy:     Cervical: No cervical adenopathy.     Upper Body:     Right upper body: No supraclavicular or axillary adenopathy.     Left upper body: No supraclavicular or axillary adenopathy.     Lower Body: No right inguinal adenopathy. No left inguinal adenopathy.  Skin:    General: Skin is warm.     Coloration: Skin is not jaundiced.     Findings: No lesion or rash.  Neurological:     General: No focal deficit present.     Mental Status: He is alert and oriented to person, place, and time. Mental status is at baseline.  Psychiatric:        Mood and Affect: Mood normal.        Behavior: Behavior normal.        Thought Content: Thought content normal.    LABS:      Latest Ref Rng & Units 02/09/2024    1:09 PM 01/31/2024    1:42 PM 01/05/2024   11:03 AM  CBC  WBC 4.0 - 10.5 K/uL 7.5  6.1  7.1   Hemoglobin 13.0 - 17.0 g/dL 15.1  15.2  15.2   Hematocrit 39.0 - 52.0 % 43.8  46.4  46.0   Platelets 150 - 400 K/uL 205  214  241       Latest Ref Rng & Units 02/10/2024   11:36 AM 02/09/2024    1:09 PM 01/31/2024    1:42 PM  CMP  Glucose 70 - 99 mg/dL 77  875  898   BUN 8 - 27 mg/dL 33  35  34   Creatinine 0.76 - 1.27 mg/dL 9.30  9.28  9.35   Sodium 134 - 144 mmol/L 134  132  131   Potassium 3.5 - 5.2 mmol/L 4.6  4.4  5.0   Chloride 96 - 106 mmol/L 93  94  93   CO2 20 - 29 mmol/L 22  27  24    Calcium  8.6 - 10.2 mg/dL 9.4  9.3  9.3   Total Protein 6.5 - 8.1 g/dL  6.8    Total Bilirubin 0.0 - 1.2 mg/dL  1.0    Alkaline Phos 38 - 126 U/L  139    AST 15 - 41 U/L  26    ALT 0 - 44 U/L  15      ASSESSMENT & PLAN:  Assessment/Plan:  A 73 y.o. male with a history of severe, splenectomy-refractory ITP.  I am pleased as his platelet count remains  fine at 261 today.  His platelet count has held stable over these past months without any particular  intervention.  From a hematologic standpoint, the patient is doing well.  Moving forward, his CBC will be checked every 2 months.  I will see this patient back in 6 months for repeat clinical assessment. The patient understands all the plans discussed today and is in agreement with them.      Kinsleigh Ludolph DELENA Kerns, MD

## 2024-02-14 ENCOUNTER — Inpatient Hospital Stay: Admitting: Oncology

## 2024-02-14 ENCOUNTER — Inpatient Hospital Stay

## 2024-02-14 ENCOUNTER — Telehealth: Payer: Self-pay | Admitting: Oncology

## 2024-02-14 ENCOUNTER — Other Ambulatory Visit: Payer: Self-pay | Admitting: Oncology

## 2024-02-14 VITALS — BP 116/83 | HR 63 | Temp 98.2°F | Resp 16 | Ht 68.5 in | Wt 197.5 lb

## 2024-02-14 DIAGNOSIS — I4891 Unspecified atrial fibrillation: Secondary | ICD-10-CM | POA: Diagnosis not present

## 2024-02-14 DIAGNOSIS — D693 Immune thrombocytopenic purpura: Secondary | ICD-10-CM

## 2024-02-14 DIAGNOSIS — Z7901 Long term (current) use of anticoagulants: Secondary | ICD-10-CM | POA: Diagnosis not present

## 2024-02-14 DIAGNOSIS — D6959 Other secondary thrombocytopenia: Secondary | ICD-10-CM | POA: Diagnosis not present

## 2024-02-14 DIAGNOSIS — Z9081 Acquired absence of spleen: Secondary | ICD-10-CM | POA: Diagnosis not present

## 2024-02-14 LAB — CBC WITH DIFFERENTIAL (CANCER CENTER ONLY)
Abs Immature Granulocytes: 0.02 K/uL (ref 0.00–0.07)
Basophils Absolute: 0 K/uL (ref 0.0–0.1)
Basophils Relative: 0 %
Eosinophils Absolute: 0.1 K/uL (ref 0.0–0.5)
Eosinophils Relative: 2 %
HCT: 44 % (ref 39.0–52.0)
Hemoglobin: 15 g/dL (ref 13.0–17.0)
Immature Granulocytes: 0 %
Lymphocytes Relative: 6 %
Lymphs Abs: 0.4 K/uL — ABNORMAL LOW (ref 0.7–4.0)
MCH: 31.4 pg (ref 26.0–34.0)
MCHC: 34.1 g/dL (ref 30.0–36.0)
MCV: 92.1 fL (ref 80.0–100.0)
Monocytes Absolute: 0.8 K/uL (ref 0.1–1.0)
Monocytes Relative: 11 %
Neutro Abs: 5.6 K/uL (ref 1.7–7.7)
Neutrophils Relative %: 81 %
Platelet Count: 227 K/uL (ref 150–400)
RBC: 4.78 MIL/uL (ref 4.22–5.81)
RDW: 18.6 % — ABNORMAL HIGH (ref 11.5–15.5)
WBC Count: 7 K/uL (ref 4.0–10.5)
nRBC: 0 % (ref 0.0–0.2)

## 2024-02-14 NOTE — Telephone Encounter (Signed)
 Patient has been scheduled for follow-up visit per 02/14/24 LOS.  Pt given an appt calendar with date and time.

## 2024-02-14 NOTE — Progress Notes (Signed)
 Patient c/o bleeding at the trach site starting 02-08-2024 and ending 02-12-2024.  Patient cut Eliquis  dose in half.  MD aware

## 2024-02-24 ENCOUNTER — Ambulatory Visit: Attending: Cardiology

## 2024-02-24 DIAGNOSIS — R0609 Other forms of dyspnea: Secondary | ICD-10-CM | POA: Insufficient documentation

## 2024-02-24 DIAGNOSIS — Z23 Encounter for immunization: Secondary | ICD-10-CM | POA: Diagnosis not present

## 2024-02-24 LAB — ECHOCARDIOGRAM COMPLETE
Area-P 1/2: 3.34 cm2
MV M vel: 4.59 m/s
MV Peak grad: 84.3 mmHg
Radius: 0.4 cm
S' Lateral: 4.2 cm

## 2024-03-01 ENCOUNTER — Telehealth: Payer: Self-pay | Admitting: Cardiology

## 2024-03-01 ENCOUNTER — Other Ambulatory Visit: Payer: Self-pay | Admitting: Cardiology

## 2024-03-01 NOTE — Telephone Encounter (Signed)
 Patient called and was asking about the results of his echo. It was explained that his echo had not been interpreted at this time. I explained that I would send the doctor a note so he could interpret the echo.Patient verbalized understanding and had no further questions at this time.

## 2024-03-01 NOTE — Telephone Encounter (Signed)
 Pt requesting a callback to explain echo results. Please advise.

## 2024-03-02 NOTE — Telephone Encounter (Signed)
 Prescription refill request for Eliquis  received. Indication: afib  Last office visit:Krasowski, 01/31/2024 Scr: 0.69, 02/10/2024 Age: 73 yo  Weight: 89.6 kg   Refill sent.

## 2024-03-09 DIAGNOSIS — Z136 Encounter for screening for cardiovascular disorders: Secondary | ICD-10-CM | POA: Diagnosis not present

## 2024-03-09 DIAGNOSIS — R072 Precordial pain: Secondary | ICD-10-CM | POA: Diagnosis not present

## 2024-03-09 DIAGNOSIS — I429 Cardiomyopathy, unspecified: Secondary | ICD-10-CM | POA: Diagnosis not present

## 2024-03-09 DIAGNOSIS — I4891 Unspecified atrial fibrillation: Secondary | ICD-10-CM | POA: Diagnosis not present

## 2024-03-09 DIAGNOSIS — Z862 Personal history of diseases of the blood and blood-forming organs and certain disorders involving the immune mechanism: Secondary | ICD-10-CM | POA: Diagnosis not present

## 2024-03-09 DIAGNOSIS — I10 Essential (primary) hypertension: Secondary | ICD-10-CM | POA: Diagnosis not present

## 2024-03-22 DIAGNOSIS — I4891 Unspecified atrial fibrillation: Secondary | ICD-10-CM | POA: Diagnosis not present

## 2024-03-22 DIAGNOSIS — R001 Bradycardia, unspecified: Secondary | ICD-10-CM | POA: Diagnosis not present

## 2024-03-22 DIAGNOSIS — R002 Palpitations: Secondary | ICD-10-CM | POA: Diagnosis not present

## 2024-03-23 DIAGNOSIS — R9431 Abnormal electrocardiogram [ECG] [EKG]: Secondary | ICD-10-CM | POA: Diagnosis not present

## 2024-03-23 DIAGNOSIS — I444 Left anterior fascicular block: Secondary | ICD-10-CM | POA: Diagnosis not present

## 2024-03-23 DIAGNOSIS — I4891 Unspecified atrial fibrillation: Secondary | ICD-10-CM | POA: Diagnosis not present

## 2024-03-30 DIAGNOSIS — Z7901 Long term (current) use of anticoagulants: Secondary | ICD-10-CM | POA: Diagnosis not present

## 2024-03-30 DIAGNOSIS — I4892 Unspecified atrial flutter: Secondary | ICD-10-CM | POA: Diagnosis not present

## 2024-03-30 DIAGNOSIS — Z09 Encounter for follow-up examination after completed treatment for conditions other than malignant neoplasm: Secondary | ICD-10-CM | POA: Diagnosis not present

## 2024-03-30 DIAGNOSIS — I4891 Unspecified atrial fibrillation: Secondary | ICD-10-CM | POA: Diagnosis not present

## 2024-03-30 DIAGNOSIS — I10 Essential (primary) hypertension: Secondary | ICD-10-CM | POA: Diagnosis not present

## 2024-03-30 DIAGNOSIS — Z862 Personal history of diseases of the blood and blood-forming organs and certain disorders involving the immune mechanism: Secondary | ICD-10-CM | POA: Diagnosis not present

## 2024-03-30 DIAGNOSIS — Z79899 Other long term (current) drug therapy: Secondary | ICD-10-CM | POA: Diagnosis not present

## 2024-03-30 DIAGNOSIS — M272 Inflammatory conditions of jaws: Secondary | ICD-10-CM | POA: Diagnosis not present

## 2024-03-30 DIAGNOSIS — Z930 Tracheostomy status: Secondary | ICD-10-CM | POA: Diagnosis not present

## 2024-03-31 DIAGNOSIS — I444 Left anterior fascicular block: Secondary | ICD-10-CM | POA: Diagnosis not present

## 2024-03-31 DIAGNOSIS — I440 Atrioventricular block, first degree: Secondary | ICD-10-CM | POA: Diagnosis not present

## 2024-04-05 ENCOUNTER — Ambulatory Visit (HOSPITAL_COMMUNITY): Admit: 2024-04-05 | Admitting: Gastroenterology

## 2024-04-05 ENCOUNTER — Encounter (HOSPITAL_COMMUNITY): Payer: Self-pay

## 2024-04-05 SURGERY — ULTRASOUND, UPPER GI TRACT, ENDOSCOPIC
Anesthesia: Monitor Anesthesia Care

## 2024-08-14 ENCOUNTER — Other Ambulatory Visit

## 2024-08-14 ENCOUNTER — Ambulatory Visit: Admitting: Oncology
